# Patient Record
Sex: Female | Born: 2000 | Race: Black or African American | Hispanic: No | Marital: Single | Smoking: Never smoker
Health system: Southern US, Community
[De-identification: ages and names within clinical notes are randomized; demographics above are authoritative.]

## PROBLEM LIST (undated history)

## (undated) ENCOUNTER — Inpatient Hospital Stay (HOSPITAL_COMMUNITY): Payer: Self-pay

## (undated) DIAGNOSIS — Z789 Other specified health status: Secondary | ICD-10-CM

## (undated) DIAGNOSIS — F419 Anxiety disorder, unspecified: Secondary | ICD-10-CM

## (undated) DIAGNOSIS — B9689 Other specified bacterial agents as the cause of diseases classified elsewhere: Secondary | ICD-10-CM

## (undated) DIAGNOSIS — N390 Urinary tract infection, site not specified: Secondary | ICD-10-CM

## (undated) DIAGNOSIS — N760 Acute vaginitis: Secondary | ICD-10-CM

## (undated) DIAGNOSIS — A549 Gonococcal infection, unspecified: Secondary | ICD-10-CM

## (undated) HISTORY — PX: NO PAST SURGERIES: SHX2092

## (undated) HISTORY — PX: WISDOM TOOTH EXTRACTION: SHX21

---
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
## 2019-02-11 ENCOUNTER — Other Ambulatory Visit: Payer: Self-pay

## 2019-02-11 ENCOUNTER — Encounter (HOSPITAL_COMMUNITY): Payer: Self-pay | Admitting: *Deleted

## 2019-02-11 ENCOUNTER — Ambulatory Visit (HOSPITAL_COMMUNITY)
Admission: EM | Admit: 2019-02-11 | Discharge: 2019-02-11 | Disposition: A | Discharge status: Home / Self Care | Payer: No Typology Code available for payment source | Attending: Emergency Medicine | Admitting: Emergency Medicine

## 2019-02-11 DIAGNOSIS — U071 COVID-19: Secondary | ICD-10-CM | POA: Diagnosis not present

## 2019-02-11 DIAGNOSIS — J00 Acute nasopharyngitis [common cold]: Secondary | ICD-10-CM | POA: Diagnosis present

## 2019-02-11 MED ORDER — SALINE SPRAY 0.65 % NA SOLN: 1.0000 | NASAL | 0 refills | Status: DC | PRN

## 2019-02-11 MED ORDER — CETIRIZINE HCL 10 MG PO TABS: 10.0000 mg | ORAL_TABLET | Freq: Every day | ORAL | 0 refills | Status: DC

## 2019-02-11 MED ORDER — FLUTICASONE PROPIONATE 50 MCG/ACT NA SUSP: 1.0000 | Freq: Every day | NASAL | 2 refills | Status: DC

## 2019-02-11 NOTE — Discharge Instructions (Signed)
Push fluids to ensure adequate hydration and keep secretions thin.  Daily zyrtec and daily flonase. Use of saline nasal spray to keep nose moisturized.  Self isolate until results are back and negative.  Will notify you of any positive findings. You may monitor your results on your MyChart online as well.

## 2019-02-11 NOTE — ED Triage Notes (Signed)
C/O starting with nasal congestion, redness, inside of nose "feeling really open", loss of smell x 3 days.  Denies any other sxs.

## 2019-02-11 NOTE — ED Provider Notes (Signed)
[facility]    CSN: [id number] Arrival date & time: 02/11/19  1050      History   Chief Complaint Chief Complaint  Patient presents with  . Nasal Congestion    HPI Denise Tanner is a 18 y.o. female.   Denise Tanner presents with complaints of 1 week of symptoms. "cold." started out with body aches. Runny nose, mild. Voice felt hoarse. Started taking daytime and night time cold medication, robitussin. These helped. A few days ago. Her nose "opened up" decreased smell and decrease taste. Tried using a nasal spray from the [employer], which hasn't helped. No further body aches. No fever. No cough. No headache. No nausea vomiting or diarrhea. No ill contacts. Some right ear pain. No sore throat. Working at [employer] currently. Without contributing medical history.      ROS per HPI, negative if not otherwise mentioned.      History reviewed. No pertinent past medical history.  Patient Active Problem List   Diagnosis Date Noted  . 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 Behavior concern     Past Surgical History:  Procedure Laterality Date  . NO PAST SURGERIES      OB History    Gravida  0   Para  0   Term  0   Preterm  0   AB  0   Living  0     SAB  0   TAB  0   Ectopic  0   Multiple  0   Live Births               Home Medications    Prior to Admission medications   Medication Sig Start Date End Date Taking? Authorizing Provider  diphenhydrAMINE HCl (ALLERGY MED PO) Take by mouth.   Yes [provider]  medroxyPROGESTERone Acetate (DEPO-PROVERA IM) Inject into the muscle.   Yes [provider]  cetirizine (ZYRTEC) 10 MG tablet Take 1 tablet (10 mg total) by mouth daily. 02/11/19   Georgetta HaberBurky, Missi Mcmackin B, NP  cyclobenzaprine (FLEXERIL) 5 MG tablet Take 2 tablets (10 mg total) by mouth 2 (two) times daily as needed for muscle spasms. 11/24/18   Aviva KluverMurray, Alyssa B, PA-C   fluticasone (FLONASE) 50 MCG/ACT nasal spray Place 1 spray into both nostrils daily. 02/11/19   Georgetta HaberBurky, Anaria Kroner B, NP  naproxen (NAPROSYN) 375 MG tablet Take 1 tablet (375 mg total) by mouth 2 (two) times daily. 11/24/18   Aviva KluverMurray, Alyssa B, PA-C  sodium chloride (OCEAN) 0.65 % SOLN nasal spray Place 1 spray into both nostrils as needed for congestion. And to moisturize nose 02/11/19   Linus MakoBurky, Dolora Ridgely B, NP  medroxyPROGESTERone (DEPO-PROVERA) 150 MG/ML injection Inject 150 mg into the muscle every 3 (three) months.  11/24/18  [provider]    Family History Family History  Problem Relation Age of Onset  . Sickle cell anemia Mother     Social History Social History   Tobacco Use  . Smoking status: Never Smoker  . Smokeless tobacco: Never Used  Substance Use Topics  . Alcohol use: No  . Drug use: No     Allergies   Patient has no known allergies.   Review of Systems Review of Systems   Physical Exam Triage Vital Signs ED Triage Vitals  Enc Vitals Group     BP 02/11/19 1118 112/75     Pulse Rate 02/11/19 1118 83  Resp 02/11/19 1118 16     Temp 02/11/19 1118 97.9 F (36.6 C)     Temp Source 02/11/19 1118 Other     SpO2 02/11/19 1118 99 %     Weight --      Height --      Head Circumference --      Peak Flow --      Pain Score 02/11/19 1121 0     Pain Loc --      Pain Edu? --      Excl. in [place]? --    No data found.  Updated Vital Signs BP 112/75   Pulse 83   Temp 97.9 F (36.6 C) (Other (Comment))   Resp 16   SpO2 99%    Physical Exam Constitutional:      General: She is not in acute distress.    Appearance: She is well-developed.  HENT:     Nose: Mucosal edema present.  Cardiovascular:     Rate and Rhythm: Normal rate and regular rhythm.     Heart sounds: Normal heart sounds.  Pulmonary:     Effort: Pulmonary effort is normal.     Breath sounds: Normal breath sounds.  Skin:    General: Skin is warm and dry.  Neurological:     Mental  Status: She is alert and oriented to person, place, and time.      UC Treatments / Results  Labs (all labs ordered are listed, but only abnormal results are displayed) Labs Reviewed  NOVEL CORONAVIRUS, NAA (HOSP ORDER, SEND-OUT TO REF LAB; TAT 18-24 HRS)    EKG   Radiology No results found.  Procedures Procedures (including critical care time)  Medications Ordered in UC Medications - No data to display  Initial Impression / Assessment and Plan / UC Course  I have reviewed the triage vital signs and the nursing notes.  Pertinent labs & imaging results that were available during my care of the patient were reviewed by me and considered in my medical decision making (see chart for details).     Non toxic. Benign physical exam.  Likely allergic rhinitis. Supportive cares recommended. covid pending. Will notify of any positive findings and if any changes to treatment are needed.  If symptoms worsen or do not improve in the next week to return to be seen or to follow up with PCP.  Patient verbalized understanding and agreeable to plan.   Final Clinical Impressions(s) / UC Diagnoses   Final diagnoses:  Acute rhinitis     Discharge Instructions     Push fluids to ensure adequate hydration and keep secretions thin.  Daily zyrtec and daily flonase. Use of saline nasal spray to keep nose moisturized.  Self isolate until results are back and negative.  Will notify you of any positive findings. You may monitor your results on your MyChart online as well.       ED Prescriptions    Medication Sig Dispense Auth. Provider   cetirizine (ZYRTEC) 10 MG tablet Take 1 tablet (10 mg total) by mouth daily. 30 tablet Augusto Gamble B, NP   fluticasone (FLONASE) 50 MCG/ACT nasal spray Place 1 spray into both nostrils daily. 16 g Augusto Gamble B, NP   sodium chloride (OCEAN) 0.65 % SOLN nasal spray Place 1 spray into both nostrils as needed for congestion. And to moisturize nose 60 mL  Augusto Gamble B, NP     Controlled Substance Prescriptions [place] Controlled Substance Registry consulted? Not Applicable  Georgetta Haber, NP 02/11/19 1229

## 2019-02-12 LAB — NOVEL CORONAVIRUS, NAA (HOSP ORDER, SEND-OUT TO REF LAB; TAT 18-24 HRS): SARS-CoV-2, NAA: DETECTED — AB

## 2019-02-13 ENCOUNTER — Telehealth (HOSPITAL_COMMUNITY): Payer: Self-pay | Admitting: Emergency Medicine

## 2019-02-13 NOTE — Telephone Encounter (Signed)
Patient contacted and made aware of   Positive covid  results, all questions answered mychart set up and work notes sent.

## 2019-02-18 ENCOUNTER — Ambulatory Visit (HOSPITAL_COMMUNITY)
Admission: EM | Admit: 2019-02-18 | Discharge: 2019-02-18 | Disposition: A | Discharge status: Home / Self Care | Payer: No Typology Code available for payment source | Attending: Emergency Medicine | Admitting: Emergency Medicine

## 2019-02-18 ENCOUNTER — Encounter (HOSPITAL_COMMUNITY): Payer: Self-pay

## 2019-02-18 ENCOUNTER — Other Ambulatory Visit: Payer: Self-pay

## 2019-02-18 DIAGNOSIS — K047 Periapical abscess without sinus: Secondary | ICD-10-CM | POA: Diagnosis not present

## 2019-02-18 DIAGNOSIS — H9201 Otalgia, right ear: Secondary | ICD-10-CM | POA: Diagnosis not present

## 2019-02-18 MED ORDER — AMOXICILLIN-POT CLAVULANATE 875-125 MG PO TABS: 1.0000 | ORAL_TABLET | Freq: Two times a day (BID) | ORAL | 0 refills | Status: AC

## 2019-02-18 MED ORDER — IBUPROFEN 600 MG PO TABS: 600.0000 mg | ORAL_TABLET | Freq: Four times a day (QID) | ORAL | 0 refills | Status: DC | PRN

## 2019-02-18 NOTE — ED Provider Notes (Addendum)
HPI  SUBJECTIVE:  Denise Tanner is a 18 y.o. female who presents with 2 issues.  First, she is requesting repeat COVID testing and wants to know if she is safe to return to work/be with her friends.  She had COVID symptoms starting 12 days ago, tested positive here 6 days ago.  She was feeling well then.  She states that she has felt completely well for about a week.  She has not required any antipyretics in the past 3 days.   Second, she reports 3 days of right upper dental pain and earache.  She describes it as intermittent, sore.  No fevers, drooling, trismus, facial swelling.  No change in hearing, otorrhea.  Ear pain is not associated with chewing or yawning.  She denies dental sensitivity to air or temperature.  No aggravating or alleviating factors.  She has not tried anything for this.  No dental trauma.  No known gingival swelling.  Past medical history negative for diabetes, smoking.  LMP: She is on Depo-Medrol.  She denies the possibility of being pregnant.  PMD: Cannot remember his name.  Dentist: [facility]    History reviewed. No pertinent past medical history.  Past Surgical History:  Procedure Laterality Date  . NO PAST SURGERIES      Family History  Problem Relation Age of Onset  . Sickle cell anemia Mother     Social History   Tobacco Use  . Smoking status: Never Smoker  . Smokeless tobacco: Never Used  Substance Use Topics  . Alcohol use: No  . Drug use: No    No current facility-administered medications for this encounter.   Current Outpatient Medications:  .  amoxicillin-clavulanate (AUGMENTIN) 875-125 MG tablet, Take 1 tablet by mouth 2 (two) times daily for 7 days., Disp: 14 tablet, Rfl: 0 .  cetirizine (ZYRTEC) 10 MG tablet, Take 1 tablet (10 mg total) by mouth daily., Disp: 30 tablet, Rfl: 0 .  cyclobenzaprine (FLEXERIL) 5 MG tablet, Take 2 tablets (10 mg total) by mouth 2 (two) times daily as needed for muscle spasms., Disp: 10 tablet, Rfl: 0 .   fluticasone (FLONASE) 50 MCG/ACT nasal spray, Place 1 spray into both nostrils daily., Disp: 16 g, Rfl: 2 .  ibuprofen (ADVIL) 600 MG tablet, Take 1 tablet (600 mg total) by mouth every 6 (six) hours as needed., Disp: 30 tablet, Rfl: 0 .  medroxyPROGESTERone Acetate (DEPO-PROVERA IM), Inject into the muscle., Disp: , Rfl:  .  sodium chloride (OCEAN) 0.65 % SOLN nasal spray, Place 1 spray into both nostrils as needed for congestion. And to moisturize nose, Disp: 60 mL, Rfl: 0  No Known Allergies   ROS  As noted in HPI.   Physical Exam  BP 117/65 (BP Location: Right Arm)   Pulse 99   Temp 98.6 F (37 C) (Oral)   Resp 18   SpO2 100%   Constitutional: Well developed, well nourished, no acute distress Eyes:  EOMI, conjunctiva normal bilaterally HENT: Normocephalic, atraumatic,mucus membranes moist.  External ear within normal limits.  EAC normal.  TM normal.  No tenderness over the mastoid, no pain with traction on pinna.  Mild tenderness over the tragus.  No tenderness over the TMJ.   Tooth number 2-second right upper molar tender to palpation with surrounding gingival swelling.  No expressible purulent drainage.  Tooth appears intact.  No trismus.  No facial swelling. Neck: No cervical lymphadenopathy. Respiratory: Normal inspiratory effort Cardiovascular: Normal rate GI: nondistended skin: No rash,  skin intact Musculoskeletal: no deformities Neurologic: Alert & oriented x 3, no focal neuro deficits Psychiatric: Speech and behavior appropriate   ED Course   Medications - No data to display  No orders of the defined types were placed in this encounter.   No results found for this or any previous visit (from the past 24 hour(s)). No results found.  ED Clinical Impression  1. Dental infection   2. Right ear pain      ED Assessment/Plan  Patient is cleared to return to work/be around her friends.  She meets CDC guidelines.  Patient with a dental infection.  Suspect  that the otalgia from this.  No evidence of otitis media.  No evidence of TMJ involvement.  Home with Augmentin, ibuprofen/Tylenol.  Follow-up with dentistry within a week.  Discussed medical decision making, treatment plan, plan for follow-up with patient.  She agrees with plan.  Meds ordered this encounter  Medications  . ibuprofen (ADVIL) 600 MG tablet    Sig: Take 1 tablet (600 mg total) by mouth every 6 (six) hours as needed.    Dispense:  30 tablet    Refill:  0  . amoxicillin-clavulanate (AUGMENTIN) 875-125 MG tablet    Sig: Take 1 tablet by mouth 2 (two) times daily for 7 days.    Dispense:  14 tablet    Refill:  0    *This clinic note was created using Scientist, clinical (histocompatibility and immunogenetics)Dragon dictation software. Therefore, there may be occasional mistakes despite careful proofreading.   ?   Domenick GongMortenson, Lenis Nettleton, MD 02/18/19 1619    Domenick GongMortenson, Wyndham Santilli, MD 02/18/19 Lisabeth Register1619    Domenick GongMortenson, Kairen Hallinan, MD 02/18/19 [id number]

## 2019-02-18 NOTE — Discharge Instructions (Addendum)
600 mg of ibuprofen combined with 1 g of Tylenol 3-4 times a day as needed for pain.  Warm or cool compresses on your face, whichever feels better.  As the dental infection gets better I suspect that your ear pain will also resolve.  You do not have an ear infection.

## 2019-02-18 NOTE — ED Triage Notes (Signed)
Pt presents with right side wisdom tooth pain and right side ear pain X 2 days.    Pt tested positive for Covid almost 2 weeks ago, pt was sick a week prior to getting tested, did recommended quarantine and is symptom free and feeling much better.

## 2019-04-04 ENCOUNTER — Encounter (HOSPITAL_COMMUNITY): Payer: Self-pay

## 2019-04-04 ENCOUNTER — Ambulatory Visit (HOSPITAL_COMMUNITY)
Admission: EM | Admit: 2019-04-04 | Discharge: 2019-04-04 | Disposition: A | Discharge status: Home / Self Care | Payer: No Typology Code available for payment source | Attending: Family Medicine | Admitting: Family Medicine

## 2019-04-04 ENCOUNTER — Other Ambulatory Visit: Payer: Self-pay

## 2019-04-04 DIAGNOSIS — R21 Rash and other nonspecific skin eruption: Secondary | ICD-10-CM | POA: Insufficient documentation

## 2019-04-04 MED ORDER — PREDNISONE 10 MG PO TABS: 40.0000 mg | ORAL_TABLET | Freq: Every day | ORAL | 0 refills | Status: AC

## 2019-04-04 NOTE — ED Triage Notes (Signed)
Pt states she has blister on her lips x 4 days. Pt states she has been using carmax on her lip.pt states her lips are getting worst.

## 2019-04-04 NOTE — ED Provider Notes (Signed)
[facility]    CSN: [id number] Arrival date & time: 04/04/19  3825      History   Chief Complaint Chief Complaint  Patient presents with  . Allergic Reaction    HPI Denise Tanner is a 18 y.o. female.   Patient is a 18 year old female that presents today with rash to oral area.  This is been constant for the past 4 days.  She has been using Carmex on the lips without any relief.  Describes the rash is mildly itchy and painful.  Feels like her symptoms are getting worse.  History of contact dermatitis in the past.  Denies being currently sexually active or any concerns of STI exposure.  No history of HSV.   Denies any fever, joint pain. Denies any recent changes in lotions, detergents, foods or other possible irritants. No recent travel. Nobody else at home has the rash. Patient has been outside but denies any contact with plants or insects. No new foods or medications.   ROS per HPI      Allergic Reaction   History reviewed. No pertinent past medical history.  Patient Active Problem List   Diagnosis Date Noted  . 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 Behavior concern     Past Surgical History:  Procedure Laterality Date  . NO PAST SURGERIES      OB History    Gravida  0   Para  0   Term  0   Preterm  0   AB  0   Living  0     SAB  0   TAB  0   Ectopic  0   Multiple  0   Live Births               Home Medications    Prior to Admission medications   Medication Sig Start Date End Date Taking? Authorizing Provider  medroxyPROGESTERone Acetate (DEPO-PROVERA IM) Inject into the muscle.    [provider]  predniSONE (DELTASONE) 10 MG tablet Take 4 tablets (40 mg total) by mouth daily for 3 days. 04/04/19 04/07/19  Dahlia Byes A, NP  cetirizine (ZYRTEC) 10 MG tablet Take 1 tablet (10 mg total) by mouth daily. 02/11/19 04/04/19  Georgetta Haber, NP  fluticasone (FLONASE) 50  MCG/ACT nasal spray Place 1 spray into both nostrils daily. 02/11/19 04/04/19  Georgetta Haber, NP  medroxyPROGESTERone (DEPO-PROVERA) 150 MG/ML injection Inject 150 mg into the muscle every 3 (three) months.  11/24/18  [provider]  sodium chloride (OCEAN) 0.65 % SOLN nasal spray Place 1 spray into both nostrils as needed for congestion. And to moisturize nose 02/11/19 04/04/19  Georgetta Haber, NP    Family History Family History  Problem Relation Age of Onset  . Sickle cell anemia Mother     Social History Social History   Tobacco Use  . Smoking status: Never Smoker  . Smokeless tobacco: Never Used  Substance Use Topics  . Alcohol use: No  . Drug use: No     Allergies   Patient has no known allergies.   Review of Systems Review of Systems   Physical Exam Triage Vital Signs ED Triage Vitals  Enc Vitals Group     BP 04/04/19 0913 119/74     Pulse Rate 04/04/19 0913 83     Resp 04/04/19 0913 16     Temp 04/04/19 0913 98.4 F (36.9 C)  Temp src --      SpO2 04/04/19 0913 99 %     Weight 04/04/19 0915 150 lb (68 kg)     Height --      Head Circumference --      Peak Flow --      Pain Score 04/04/19 0914 8     Pain Loc --      Pain Edu? --      Excl. in [place]? --    No data found.  Updated Vital Signs BP 119/74 (BP Location: Right Arm)   Pulse 83   Temp 98.4 F (36.9 C)   Resp 16   Wt 150 lb (68 kg)   SpO2 99%   Visual Acuity Right Eye Distance:   Left Eye Distance:   Bilateral Distance:    Right Eye Near:   Left Eye Near:    Bilateral Near:     Physical Exam Vitals signs reviewed.  Constitutional:      General: She is not in acute distress.    Appearance: Normal appearance. She is not ill-appearing, toxic-appearing or diaphoretic.  HENT:     Head: Normocephalic and atraumatic.     Nose: Nose normal.     Mouth/Throat:     Pharynx: Oropharynx is clear.  Eyes:     Conjunctiva/sclera: Conjunctivae normal.  Pulmonary:     Effort:  Pulmonary effort is normal.  Musculoskeletal: Normal range of motion.  Skin:    General: Skin is warm and dry.     Findings: Rash present.     Comments: Papular rash to perioral area with some pustules.  Neurological:     Mental Status: She is alert.  Psychiatric:        Mood and Affect: Mood normal.      UC Treatments / Results  Labs (all labs ordered are listed, but only abnormal results are displayed) Labs Reviewed  HSV CULTURE AND TYPING    EKG   Radiology No results found.  Procedures Procedures (including critical care time)  Medications Ordered in UC Medications - No data to display  Initial Impression / Assessment and Plan / UC Course  I have reviewed the triage vital signs and the nursing notes.  Pertinent labs & imaging results that were available during my care of the patient were reviewed by me and considered in my medical decision making (see chart for details).     Rash-treating for possible contact dermatitis Prednisone daily for 3 days. Recommended not putting anything on the lips or face at this time. Swab for HSV to rule this out but less likely Follow up as needed for continued or worsening symptoms  Final Clinical Impressions(s) / UC Diagnoses   Final diagnoses:  Rash     Discharge Instructions     Take the prednisone daily for 3 days.  Do not put anything else on the lips.  Take this medication with food. We will let you know what your swab results are. Follow up as needed for continued or worsening symptoms     ED Prescriptions    Medication Sig Dispense Auth. Provider   predniSONE (DELTASONE) 10 MG tablet Take 4 tablets (40 mg total) by mouth daily for 3 days. 12 tablet Novie Maggio A, NP     PDMP not reviewed this encounter.   Orvan July, NP 04/04/19 1109

## 2019-04-04 NOTE — Discharge Instructions (Signed)
Take the prednisone daily for 3 days.  Do not put anything else on the lips.  Take this medication with food. We will let you know what your swab results are. Follow up as needed for continued or worsening symptoms

## 2019-04-06 LAB — HSV CULTURE AND TYPING

## 2019-04-18 ENCOUNTER — Other Ambulatory Visit: Payer: Self-pay

## 2019-04-18 DIAGNOSIS — Z20822 Contact with and (suspected) exposure to covid-19: Secondary | ICD-10-CM

## 2019-04-19 LAB — NOVEL CORONAVIRUS, NAA: SARS-CoV-2, NAA: NOT DETECTED

## 2019-04-20 ENCOUNTER — Ambulatory Visit (HOSPITAL_COMMUNITY)
Admission: EM | Admit: 2019-04-20 | Discharge: 2019-04-20 | Disposition: A | Discharge status: Home / Self Care | Payer: No Typology Code available for payment source | Attending: Urgent Care | Admitting: Urgent Care

## 2019-04-20 ENCOUNTER — Encounter (HOSPITAL_COMMUNITY): Payer: Self-pay | Admitting: Emergency Medicine

## 2019-04-20 ENCOUNTER — Other Ambulatory Visit: Payer: Self-pay

## 2019-04-20 DIAGNOSIS — Z3202 Encounter for pregnancy test, result negative: Secondary | ICD-10-CM | POA: Diagnosis not present

## 2019-04-20 DIAGNOSIS — R82998 Other abnormal findings in urine: Secondary | ICD-10-CM | POA: Diagnosis present

## 2019-04-20 DIAGNOSIS — N760 Acute vaginitis: Secondary | ICD-10-CM

## 2019-04-20 DIAGNOSIS — B9689 Other specified bacterial agents as the cause of diseases classified elsewhere: Secondary | ICD-10-CM

## 2019-04-20 LAB — POCT URINALYSIS DIP (DEVICE)
Bilirubin Urine: NEGATIVE
Glucose, UA: NEGATIVE mg/dL
Ketones, ur: NEGATIVE mg/dL
Nitrite: NEGATIVE
Protein, ur: NEGATIVE mg/dL
Specific Gravity, Urine: 1.01 (ref 1.005–1.030)
Urobilinogen, UA: 0.2 mg/dL (ref 0.0–1.0)
pH: 6.5 (ref 5.0–8.0)

## 2019-04-20 LAB — POCT PREGNANCY, URINE: Preg Test, Ur: NEGATIVE

## 2019-04-20 NOTE — ED Provider Notes (Signed)
[place]   MRN: [id number] DOB: 22-May-2001  Subjective:   Denise Tanner is a 18 y.o. female presenting for 1 day hx of dark urine. Patient went to HD, is undergoing tx for BV but does not recall medication. Hydrates with ~2 bottles of water a day. Does not drink other beverages. No alcohol use.      No Known Allergies  History reviewed. No pertinent past medical history.   Past Surgical History:  Procedure Laterality Date  . NO PAST SURGERIES      Family History  Problem Relation Age of Onset  . Sickle cell anemia Mother     Social History   Tobacco Use  . Smoking status: Never Smoker  . Smokeless tobacco: Never Used  Substance Use Topics  . Alcohol use: No  . Drug use: No    Review of Systems  Constitutional: Negative for chills, fever and malaise/fatigue.  Respiratory: Negative for cough and shortness of breath.   Cardiovascular: Negative for chest pain.  Gastrointestinal: Negative for abdominal pain, constipation, diarrhea, nausea and vomiting.  Genitourinary: Negative for dysuria, flank pain, frequency, hematuria and urgency.  Musculoskeletal: Negative for back pain and myalgias.  Skin: Negative for rash.  Neurological: Negative for dizziness and headaches.  Psychiatric/Behavioral: Negative for substance abuse.     Objective:   Vitals: BP 106/69 (BP Location: Left Arm)   Pulse 68   Temp 98.7 F (37.1 C) (Oral)   Resp 12   LMP 04/09/2019 (Exact Date)   SpO2 100%   Physical Exam Constitutional:      General: She is not in acute distress.    Appearance: Normal appearance. She is well-developed and normal weight. She is not ill-appearing, toxic-appearing or diaphoretic.  HENT:     Head: Normocephalic and atraumatic.     Right Ear: External ear normal.     Left Ear: External ear normal.     Nose: Nose normal.     Mouth/Throat:     Mouth: Mucous membranes are moist.     Pharynx: Oropharynx is clear.  Eyes:     General: No scleral  icterus.    Extraocular Movements: Extraocular movements intact.     Pupils: Pupils are equal, round, and reactive to light.  Cardiovascular:     Rate and Rhythm: Normal rate and regular rhythm.     Heart sounds: Normal heart sounds. No murmur. No friction rub. No gallop.   Pulmonary:     Effort: Pulmonary effort is normal. No respiratory distress.     Breath sounds: Normal breath sounds. No stridor. No wheezing, rhonchi or rales.  Abdominal:     General: Bowel sounds are normal. There is no distension.     Palpations: Abdomen is soft. There is no mass.     Tenderness: There is no abdominal tenderness. There is no right CVA tenderness, left CVA tenderness, guarding or rebound.  Skin:    General: Skin is warm and dry.     Coloration: Skin is not pale.     Findings: No rash.  Neurological:     General: No focal deficit present.     Mental Status: She is alert and oriented to person, place, and time.  Psychiatric:        Mood and Affect: Mood normal.        Behavior: Behavior normal.        Thought Content: Thought content normal.        Judgment: Judgment normal.  Results for orders placed or performed during the hospital encounter of 04/20/19 (from the past 24 hour(s))  POCT urinalysis dip (device)     Status: Abnormal   Collection Time: 04/20/19 10:12 AM  Result Value Ref Range   Glucose, UA NEGATIVE NEGATIVE mg/dL   Bilirubin Urine NEGATIVE NEGATIVE   Ketones, ur NEGATIVE NEGATIVE mg/dL   Specific Gravity, Urine 1.010 1.005 - 1.030   Hgb urine dipstick TRACE (A) NEGATIVE   pH 6.5 5.0 - 8.0   Protein, ur NEGATIVE NEGATIVE mg/dL   Urobilinogen, UA 0.2 0.0 - 1.0 mg/dL   Nitrite NEGATIVE NEGATIVE   Leukocytes,Ua TRACE (A) NEGATIVE  Pregnancy, urine POC     Status: None   Collection Time: 04/20/19 10:16 AM  Result Value Ref Range   Preg Test, Ur NEGATIVE NEGATIVE    Assessment and Plan :   1. Dark urine   2. Bacterial vaginosis     Urine culture pending patient  has already had STI testing.  Recommended that she finish course of antibiotic for treating her BV.  Advised daily adequate hydration with at least 64 ounces of water. Counseled patient on potential for adverse effects with medications prescribed/recommended today, ER and return-to-clinic precautions discussed, patient verbalized understanding.    Wallis Bamberg, PA-C 04/20/19 1144

## 2019-04-20 NOTE — ED Triage Notes (Signed)
Pt here for concerns for dark colored urine.  [facility] and was [facility] medication.  She states her BV symptoms are getting better but she is concerned with the color of her urine.

## 2019-04-20 NOTE — Discharge Instructions (Signed)
Make sure you hydrate well with at least 64 ounces of water daily. Finish your antibiotic for BV. If you develop fever, n/v, painful urination, frank blood in the urine, belly pain, then come back for a recheck.

## 2019-04-23 LAB — URINE CULTURE: Culture: 50000 — AB

## 2019-04-24 ENCOUNTER — Other Ambulatory Visit: Payer: Self-pay

## 2019-04-24 ENCOUNTER — Telehealth (HOSPITAL_COMMUNITY): Payer: Self-pay | Admitting: Emergency Medicine

## 2019-04-24 ENCOUNTER — Ambulatory Visit (HOSPITAL_COMMUNITY)
Admission: EM | Admit: 2019-04-24 | Discharge: 2019-04-24 | Disposition: A | Discharge status: Home / Self Care | Payer: No Typology Code available for payment source | Attending: Internal Medicine | Admitting: Internal Medicine

## 2019-04-24 DIAGNOSIS — N760 Acute vaginitis: Secondary | ICD-10-CM | POA: Diagnosis present

## 2019-04-24 LAB — POCT URINALYSIS DIP (DEVICE)
Glucose, UA: NEGATIVE mg/dL
Nitrite: POSITIVE — AB
Protein, ur: 30 mg/dL — AB
Specific Gravity, Urine: >=1.03 (ref 1.005–1.030)
Urobilinogen, UA: 0.2 mg/dL (ref 0.0–1.0)
pH: 5.5 (ref 5.0–8.0)

## 2019-04-24 MED ORDER — METRONIDAZOLE 500 MG PO TABS: 500.0000 mg | ORAL_TABLET | Freq: Two times a day (BID) | ORAL | 0 refills | Status: DC

## 2019-04-24 NOTE — Telephone Encounter (Signed)
Urine culture only had 50,000 colonies. Called patient and discussed with her, she states she has no urinary symptoms but is c/o of a lot of vaginal discharge and irritation. Pt states she would like to return to be seen. Pt instructed that we will not do any intervention regarding urine culture until she sees a provider today for follow up. Pt agreeable to plan, will discuss urine culture with provider when she returns to clinic.

## 2019-04-24 NOTE — ED Triage Notes (Signed)
Patient presents to Urgent Care with complaints of continued vaginal discharge and swelling on her right labia since a few days ago. Patient reports she was treated for BV [facility] the [facility] and did not take all the metronidazole (knows she should have completed course) and then had sexual intercourse and her partner "sucked on it too hard and now the right lip is swollen" pt reports the discharge is still happening.

## 2019-04-26 ENCOUNTER — Telehealth (HOSPITAL_COMMUNITY): Payer: Self-pay | Admitting: Emergency Medicine

## 2019-04-26 LAB — CERVICOVAGINAL ANCILLARY ONLY
Bacterial vaginitis: NEGATIVE
Candida vaginitis: POSITIVE — AB
Chlamydia: NEGATIVE
Neisseria Gonorrhea: NEGATIVE
Trichomonas: NEGATIVE

## 2019-04-26 MED ORDER — FLUCONAZOLE 150 MG PO TABS: 150.0000 mg | ORAL_TABLET | Freq: Once | ORAL | 0 refills | Status: AC

## 2019-04-26 MED ORDER — CEPHALEXIN 500 MG PO CAPS: 500.0000 mg | ORAL_CAPSULE | Freq: Two times a day (BID) | ORAL | 0 refills | Status: AC

## 2019-04-26 NOTE — ED Provider Notes (Signed)
[facility]    CSN: [id number] Arrival date & time: 04/24/19  1741      History   Chief Complaint Chief Complaint  Patient presents with  . Vaginal Discharge    HPI Denise Tanner is a 18 y.o. female with no past medical history comes to urgent care with vaginal discharge and swelling of the right labia.  Patient was diagnosed with bacterial vaginosis and was prescribed metronidazole but the patient did not complete the treatment.  She continues to have foul-smelling vaginal discharge.  Discharge is discolored.  Patient also complains of pain and swelling of the right labia.   She recently had sexual intercourse with her boyfriend.  Patient attributes the swelling to the recent intercourse.  Patient denies any dysuria, urgency or frequency.  HPI  No past medical history on file.  Patient Active Problem List   Diagnosis Date Noted  . 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 Behavior concern     Past Surgical History:  Procedure Laterality Date  . NO PAST SURGERIES      OB History    Gravida  0   Para  0   Term  0   Preterm  0   AB  0   Living  0     SAB  0   TAB  0   Ectopic  0   Multiple  0   Live Births               Home Medications    Prior to Admission medications   Medication Sig Start Date End Date Taking? Authorizing Provider  metroNIDAZOLE (FLAGYL) 500 MG tablet Take 1 tablet (500 mg total) by mouth 2 (two) times daily. 04/24/19   Merrilee Jansky, MD  cetirizine (ZYRTEC) 10 MG tablet Take 1 tablet (10 mg total) by mouth daily. 02/11/19 04/04/19  Georgetta Haber, NP  fluticasone (FLONASE) 50 MCG/ACT nasal spray Place 1 spray into both nostrils daily. 02/11/19 04/04/19  Georgetta Haber, NP  medroxyPROGESTERone (DEPO-PROVERA) 150 MG/ML injection Inject 150 mg into the muscle every 3 (three) months.  11/24/18  [provider]  sodium chloride (OCEAN) 0.65 % SOLN nasal spray Place  1 spray into both nostrils as needed for congestion. And to moisturize nose 02/11/19 04/04/19  Georgetta Haber, NP    Family History Family History  Problem Relation Age of Onset  . Sickle cell anemia Mother     Social History Social History   Tobacco Use  . Smoking status: Never Smoker  . Smokeless tobacco: Never Used  Substance Use Topics  . Alcohol use: No  . Drug use: No     Allergies   Patient has no known allergies.   Review of Systems Review of Systems  Constitutional: Negative.   Gastrointestinal: Negative for abdominal pain, nausea and vomiting.  Genitourinary: Positive for genital sores, vaginal discharge and vaginal pain. Negative for dysuria, frequency, pelvic pain, urgency and vaginal bleeding.  Musculoskeletal: Negative.   Neurological: Negative.      Physical Exam Triage Vital Signs ED Triage Vitals  Enc Vitals Group     BP 04/24/19 1921 120/78     Pulse Rate 04/24/19 1921 92     Resp 04/24/19 1921 14     Temp 04/24/19 1921 98.3 F (36.8 C)     Temp Source 04/24/19 1921 Oral     SpO2 04/24/19 1921 100 %     Weight --  Height --      Head Circumference --      Peak Flow --      Pain Score 04/24/19 1920 0     Pain Loc --      Pain Edu? --      Excl. in [place]? --    No data found.  Updated Vital Signs BP 120/78 (BP Location: Right Arm)   Pulse 92   Temp 98.3 F (36.8 C) (Oral)   Resp 14   LMP 04/09/2019 (Exact Date)   SpO2 100%   Visual Acuity Right Eye Distance:   Left Eye Distance:   Bilateral Distance:    Right Eye Near:   Left Eye Near:    Bilateral Near:     Physical Exam Vitals signs and nursing note reviewed.  Constitutional:      General: She is not in acute distress.    Appearance: Normal appearance. She is not ill-appearing.  Neck:     Musculoskeletal: Normal range of motion and neck supple.  Cardiovascular:     Rate and Rhythm: Normal rate and regular rhythm.  Pulmonary:     Effort: Pulmonary effort is normal.      Breath sounds: Normal breath sounds.  Abdominal:     General: Bowel sounds are normal. There is no distension.     Palpations: Abdomen is soft.     Tenderness: There is no abdominal tenderness. There is no guarding or rebound.  Genitourinary:    General: Normal vulva.     Vagina: Vaginal discharge present.     Rectum: Normal.  Musculoskeletal: Normal range of motion.  Skin:    General: Skin is warm.  Neurological:     General: No focal deficit present.     Mental Status: She is alert.      UC Treatments / Results  Labs (all labs ordered are listed, but only abnormal results are displayed) Labs Reviewed  POCT URINALYSIS DIP (DEVICE) - Abnormal; Notable for the following components:      Result Value   Bilirubin Urine SMALL (*)    Ketones, ur TRACE (*)    Hgb urine dipstick TRACE (*)    Protein, ur 30 (*)    Nitrite POSITIVE (*)    Leukocytes,Ua SMALL (*)    All other components within normal limits  CERVICOVAGINAL ANCILLARY ONLY - Abnormal; Notable for the following components:   Candida vaginitis **POSITIVE for Candida species** (*)    All other components within normal limits    EKG   Radiology No results found.  Procedures Procedures (including critical care time)  Medications Ordered in UC Medications - No data to display  Initial Impression / Assessment and Plan / UC Course  I have reviewed the triage vital signs and the nursing notes.  Pertinent labs & imaging results that were available during my care of the patient were reviewed by me and considered in my medical decision making (see chart for details).     1.  Vaginal discharge likely bacterial vaginosis: Metronidazole 500 mg twice daily for 7 days Cervicovaginal for STD testing as well as bacterial vaginosis and yeast infection If the test results are significant, patient will be informed and the treatment regimen will be updated If patient's symptoms worsens she is advised to return to urgent  care to be reevaluated Adherence to medication was emphasized. Final Clinical Impressions(s) / UC Diagnoses   Final diagnoses:  Vaginitis and vulvovaginitis   Discharge Instructions   None  ED Prescriptions    Medication Sig Dispense Auth. Provider   metroNIDAZOLE (FLAGYL) 500 MG tablet Take 1 tablet (500 mg total) by mouth 2 (two) times daily. 14 tablet Bayyinah Dukeman, Britta MccreedyPhilip O, MD     PDMP not reviewed this encounter.   Merrilee JanskyLamptey, Gwynneth Fabio O, MD 04/26/19 1128

## 2019-04-26 NOTE — Telephone Encounter (Signed)
Per Dr. Lanny Cramp, will treat pt for uti with keflex 500mg  bid x5 days. Yeast also not treated. Patient contacted and made aware of    results. Pt verbalized understanding and had all questions answered.

## 2019-06-09 ENCOUNTER — Ambulatory Visit (HOSPITAL_COMMUNITY)
Admission: EM | Admit: 2019-06-09 | Discharge: 2019-06-09 | Disposition: A | Discharge status: Home / Self Care | Payer: No Typology Code available for payment source | Attending: Urgent Care | Admitting: Urgent Care

## 2019-06-09 ENCOUNTER — Other Ambulatory Visit: Payer: Self-pay

## 2019-06-09 ENCOUNTER — Encounter (HOSPITAL_COMMUNITY): Payer: Self-pay

## 2019-06-09 DIAGNOSIS — N939 Abnormal uterine and vaginal bleeding, unspecified: Secondary | ICD-10-CM | POA: Diagnosis present

## 2019-06-09 DIAGNOSIS — Z3202 Encounter for pregnancy test, result negative: Secondary | ICD-10-CM | POA: Diagnosis not present

## 2019-06-09 DIAGNOSIS — N3000 Acute cystitis without hematuria: Secondary | ICD-10-CM | POA: Insufficient documentation

## 2019-06-09 LAB — POC URINE PREG, ED: Preg Test, Ur: NEGATIVE

## 2019-06-09 LAB — POCT URINALYSIS DIP (DEVICE)
Glucose, UA: NEGATIVE mg/dL
Ketones, ur: 15 mg/dL — AB
Nitrite: NEGATIVE
Protein, ur: 100 mg/dL — AB
Specific Gravity, Urine: >=1.03 (ref 1.005–1.030)
Urobilinogen, UA: 1 mg/dL (ref 0.0–1.0)
pH: 6.5 (ref 5.0–8.0)

## 2019-06-09 LAB — POCT PREGNANCY, URINE: Preg Test, Ur: NEGATIVE

## 2019-06-09 MED ORDER — NITROFURANTOIN MONOHYD MACRO 100 MG PO CAPS: 100.0000 mg | ORAL_CAPSULE | Freq: Two times a day (BID) | ORAL | 0 refills | Status: AC

## 2019-06-09 NOTE — ED Triage Notes (Signed)
Pt present a tingling sensation when she urinate. She also states that it has been difficult for her to urinate as while.

## 2019-06-09 NOTE — ED Provider Notes (Signed)
[facility]    CSN: [id number] Arrival date & time: 06/09/19  1630      History   Chief Complaint Chief Complaint  Patient presents with  . Exposure to STD    HPI Denise Tanner is a 19 y.o. female.   Denise Tanner presents with complaints of pain, frequency and difficulty with urination which started today. No back pain or pelvic pain. No fevers. Has had some vaginal spotting as well. States this month her period was irregular, lasting longer than normal for her. Her first period after stopping Depo was in November. She is sexually active with 1 partner, doesn't use condoms. Denies any other vaginal symptoms, no known std exposure. She has not been on depo in 6 months. No noted blood to urine. Denies any previous similar.    ROS per HPI, negative if not otherwise mentioned.      History reviewed. No pertinent past medical history.  Patient Active Problem List   Diagnosis Date Noted  . 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 Behavior concern     Past Surgical History:  Procedure Laterality Date  . NO PAST SURGERIES      OB History    Gravida  0   Para  0   Term  0   Preterm  0   AB  0   Living  0     SAB  0   TAB  0   Ectopic  0   Multiple  0   Live Births               Home Medications    Prior to Admission medications   Medication Sig Start Date End Date Taking? Authorizing Provider  metroNIDAZOLE (FLAGYL) 500 MG tablet Take 1 tablet (500 mg total) by mouth 2 (two) times daily. 04/24/19   LampteyBritta Mccreedy, MD  nitrofurantoin, macrocrystal-monohydrate, (MACROBID) 100 MG capsule Take 1 capsule (100 mg total) by mouth 2 (two) times daily for 5 days. 06/09/19 06/14/19  Georgetta Haber, NP  cetirizine (ZYRTEC) 10 MG tablet Take 1 tablet (10 mg total) by mouth daily. 02/11/19 04/04/19  Georgetta Haber, NP  fluticasone (FLONASE) 50 MCG/ACT nasal spray Place 1 spray into both nostrils daily.  02/11/19 04/04/19  Georgetta Haber, NP  medroxyPROGESTERone (DEPO-PROVERA) 150 MG/ML injection Inject 150 mg into the muscle every 3 (three) months.  11/24/18  [provider]  sodium chloride (OCEAN) 0.65 % SOLN nasal spray Place 1 spray into both nostrils as needed for congestion. And to moisturize nose 02/11/19 04/04/19  Georgetta Haber, NP    Family History Family History  Problem Relation Age of Onset  . Sickle cell anemia Mother     Social History Social History   Tobacco Use  . Smoking status: Never Smoker  . Smokeless tobacco: Never Used  Substance Use Topics  . Alcohol use: No  . Drug use: No     Allergies   Patient has no known allergies.   Review of Systems Review of Systems   Physical Exam Triage Vital Signs ED Triage Vitals [06/09/19 1705]  Enc Vitals Group     BP      Pulse      Resp      Temp      Temp src      SpO2      Weight      Height      Head  Circumference      Peak Flow      Pain Score 0     Pain Loc      Pain Edu?      Excl. in [place]?    No data found.  Updated Vital Signs There were no vitals taken for this visit.  Visual Acuity Right Eye Distance:   Left Eye Distance:   Bilateral Distance:    Right Eye Near:   Left Eye Near:    Bilateral Near:     Physical Exam Constitutional:      General: She is not in acute distress.    Appearance: She is well-developed.  Cardiovascular:     Rate and Rhythm: Normal rate.  Pulmonary:     Effort: Pulmonary effort is normal.  Abdominal:     Palpations: Abdomen is soft. Abdomen is not rigid.     Tenderness: There is no abdominal tenderness. There is no right CVA tenderness, left CVA tenderness, guarding or rebound.  Genitourinary:    Comments: Denies sores, lesions, vaginal bleeding; no pelvic pain; gu exam deferred at this time, vaginal self swab collected.   Skin:    General: Skin is warm and dry.  Neurological:     Mental Status: She is alert and oriented to person, place,  and time.      UC Treatments / Results  Labs (all labs ordered are listed, but only abnormal results are displayed) Labs Reviewed  POCT URINALYSIS DIP (DEVICE) - Abnormal; Notable for the following components:      Result Value   Bilirubin Urine SMALL (*)    Ketones, ur 15 (*)    Hgb urine dipstick LARGE (*)    Protein, ur 100 (*)    Leukocytes,Ua TRACE (*)    All other components within normal limits  URINE CULTURE  POC URINE PREG, ED  POCT PREGNANCY, URINE  CERVICOVAGINAL ANCILLARY ONLY    EKG   Radiology No results found.  Procedures Procedures (including critical care time)  Medications Ordered in UC Medications - No data to display  Initial Impression / Assessment and Plan / UC Course  I have reviewed the triage vital signs and the nursing notes.  Pertinent labs & imaging results that were available during my care of the patient were reviewed by me and considered in my medical decision making (see chart for details).     Trace leuks to urine with symptoms of uti, culture obtained. Blood to urine but also with some vaginal spotting. macrobid initiated pending urine culture. Vaginal cytology also pending. Encouraged follow up with PCP and/or gyne if periods remain irregular. Return precautions provided. Patient verbalized understanding and agreeable to plan.   Final Clinical Impressions(s) / UC Diagnoses   Final diagnoses:  Acute cystitis without hematuria  Vaginal spotting     Discharge Instructions     We will start treatment for UTI. I have sent your urine to be cultured to confirm this as well.  Will notify you of any positive findings from your vaginal swab and if any changes to treatment are needed.   If any persistent symptoms or continued irregular bleeding please follow up with your gynecologist or PCP.    ED Prescriptions    Medication Sig Dispense Auth. Provider   nitrofurantoin, macrocrystal-monohydrate, (MACROBID) 100 MG capsule Take 1  capsule (100 mg total) by mouth 2 (two) times daily for 5 days. 10 capsule Georgetta Haber, NP     PDMP not reviewed this encounter.  Zigmund Gottron, NP 06/09/19 [phone]

## 2019-06-09 NOTE — Discharge Instructions (Addendum)
We will start treatment for UTI. I have sent your urine to be cultured to confirm this as well.  Will notify you of any positive findings from your vaginal swab and if any changes to treatment are needed.   If any persistent symptoms or continued irregular bleeding please follow up with your gynecologist or PCP.

## 2019-06-13 LAB — CERVICOVAGINAL ANCILLARY ONLY
Bacterial vaginitis: NEGATIVE
Candida vaginitis: NEGATIVE
Chlamydia: NEGATIVE
Neisseria Gonorrhea: NEGATIVE
Trichomonas: NEGATIVE

## 2019-07-16 ENCOUNTER — Ambulatory Visit (HOSPITAL_COMMUNITY)
Admission: EM | Admit: 2019-07-16 | Discharge: 2019-07-16 | Disposition: A | Discharge status: Home / Self Care | Payer: No Typology Code available for payment source | Attending: Family Medicine | Admitting: Family Medicine

## 2019-07-16 ENCOUNTER — Other Ambulatory Visit: Payer: Self-pay

## 2019-07-16 DIAGNOSIS — J029 Acute pharyngitis, unspecified: Secondary | ICD-10-CM | POA: Diagnosis present

## 2019-07-16 DIAGNOSIS — R0981 Nasal congestion: Secondary | ICD-10-CM | POA: Insufficient documentation

## 2019-07-16 DIAGNOSIS — H6501 Acute serous otitis media, right ear: Secondary | ICD-10-CM | POA: Diagnosis present

## 2019-07-16 MED ORDER — IPRATROPIUM BROMIDE 0.03 % NA SOLN: 2.0000 | Freq: Two times a day (BID) | NASAL | 0 refills | Status: DC

## 2019-07-16 MED ORDER — AMOXICILLIN 875 MG PO TABS: 875.0000 mg | ORAL_TABLET | Freq: Two times a day (BID) | ORAL | 0 refills | Status: DC

## 2019-07-16 NOTE — ED Triage Notes (Signed)
Pt present right ear pain, symptoms started two days ago. Pt denies any other symptoms from the ear pain .

## 2019-07-17 NOTE — ED Provider Notes (Addendum)
[facility]    CSN: [id number] Arrival date & time: 07/16/19  1628      History   Chief Complaint Chief Complaint  Patient presents with  . Otalgia    HPI Denise Tanner is a 19 y.o. female.   HPI Otalgia  Ear Pain: Patient presents with right ear pain.  Symptoms include gradually worsening inner pain that is occasionally sharp on-going 1 week. She denies fever. She is have right sided throat pain and nasal congestion. Denies history of strep throat , allergies , or ET dysfunction. No fever, chills, cough, or N&V, shortness of breath or changes to hearing.  No past medical history on file.  Patient Active Problem List   Diagnosis Date Noted  . 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 Behavior concern     Past Surgical History:  Procedure Laterality Date  . NO PAST SURGERIES      OB History    Gravida  0   Para  0   Term  0   Preterm  0   AB  0   Living  0     SAB  0   TAB  0   Ectopic  0   Multiple  0   Live Births               Home Medications    Prior to Admission medications   Medication Sig Start Date End Date Taking? Authorizing Provider  amoxicillin (AMOXIL) 875 MG tablet Take 1 tablet (875 mg total) by mouth 2 (two) times daily. 07/16/19   Bing Neighbors, FNP  ipratropium (ATROVENT) 0.03 % nasal spray Place 2 sprays into both nostrils 2 (two) times daily. 07/16/19   Bing Neighbors, FNP  metroNIDAZOLE (FLAGYL) 500 MG tablet Take 1 tablet (500 mg total) by mouth 2 (two) times daily. 04/24/19   Merrilee Jansky, MD  cetirizine (ZYRTEC) 10 MG tablet Take 1 tablet (10 mg total) by mouth daily. 02/11/19 04/04/19  Georgetta Haber, NP  fluticasone (FLONASE) 50 MCG/ACT nasal spray Place 1 spray into both nostrils daily. 02/11/19 04/04/19  Georgetta Haber, NP  medroxyPROGESTERone (DEPO-PROVERA) 150 MG/ML injection Inject 150 mg into the muscle every 3 (three) months.  11/24/18  [provider]  sodium chloride (OCEAN) 0.65 % SOLN nasal spray Place 1 spray into both nostrils as needed for congestion. And to moisturize nose 02/11/19 04/04/19  Georgetta Haber, NP    Family History Family History  Problem Relation Age of Onset  . Sickle cell anemia Mother     Social History Social History   Tobacco Use  . Smoking status: Never Smoker  . Smokeless tobacco: Never Used  Substance Use Topics  . Alcohol use: No  . Drug use: No     Allergies   Patient has no known allergies.   Review of Systems Review of Systems Pertinent negatives listed in HPI Physical Exam Triage Vital Signs ED Triage Vitals  Enc Vitals Group     BP 07/16/19 1731 116/74     Pulse Rate 07/16/19 1731 80     Resp 07/16/19 1731 16     Temp 07/16/19 1731 99.3 F (37.4 C)     Temp Source 07/16/19 1731 Oral     SpO2 07/16/19 1731 99 %     Weight --      Height --      Head Circumference --  Peak Flow --      Pain Score 07/16/19 1739 8     Pain Loc --      Pain Edu? --      Excl. in [place]? --    No data found.  Updated Vital Signs BP 116/74 (BP Location: Left Arm)   Pulse 80   Temp 99.3 F (37.4 C) (Oral)   Resp 16   SpO2 99%   Visual Acuity Right Eye Distance:   Left Eye Distance:   Bilateral Distance:    Right Eye Near:   Left Eye Near:    Bilateral Near:     Physical Exam Constitutional:      Appearance: Normal appearance.  HENT:     Right Ear: Swelling and tenderness present. A middle ear effusion is present.     Mouth/Throat:     Pharynx: Pharyngeal swelling and posterior oropharyngeal erythema present. No oropharyngeal exudate or uvula swelling.     Tonsils: No tonsillar exudate or tonsillar abscesses. 2+ on the right. 2+ on the left.  Cardiovascular:     Rate and Rhythm: Normal rate.  Musculoskeletal:     Cervical back: Normal range of motion.  Lymphadenopathy:     Cervical: Cervical adenopathy present.  Neurological:     Mental Status: She is alert.       UC Treatments / Results  Labs (all labs ordered are listed, but only abnormal results are displayed) Labs Reviewed  CULTURE, GROUP A STREP [facility])    EKG   Radiology No results found.  Procedures Procedures (including critical care time)  Medications Ordered in UC Medications - No data to display  Initial Impression / Assessment and Plan / UC Course  I have reviewed the triage vital signs and the nursing notes.  Pertinent labs & imaging results that were available during my care of the patient were reviewed by me and considered in my medical decision making (see chart for details).      Non-recurrent acute serous otitis media of right ear Acute pharyngitis, unspecified etiology  - Amoxicillin 875 mg BID x 10 days  -Throat Culture pending   Nasal congestion -Atrovent nasal spray 2 sprays BID PRN    Final Clinical Impressions(s) / UC Diagnoses   Final diagnoses:  Non-recurrent acute serous otitis media of right ear  Acute pharyngitis, unspecified etiology  Nasal congestion   Discharge Instructions   None    ED Prescriptions    Medication Sig Dispense Auth. Provider   amoxicillin (AMOXIL) 875 MG tablet Take 1 tablet (875 mg total) by mouth 2 (two) times daily. 20 tablet Scot Jun, FNP   ipratropium (ATROVENT) 0.03 % nasal spray Place 2 sprays into both nostrils 2 (two) times daily. 30 mL Scot Jun, FNP     PDMP not reviewed this encounter.   Scot Jun, FNP 07/18/19 0848    Scot Jun, FNP 07/18/19 [phone]

## 2019-07-19 LAB — CULTURE, GROUP A STREP (THRC)

## 2019-07-21 ENCOUNTER — Other Ambulatory Visit: Payer: Self-pay

## 2019-07-21 ENCOUNTER — Encounter: Payer: Self-pay | Admitting: Certified Nurse Midwife

## 2019-07-21 ENCOUNTER — Ambulatory Visit: Payer: No Typology Code available for payment source | Admitting: Certified Nurse Midwife

## 2019-07-21 VITALS — BP 111/70 | HR 84 | Ht 64.0 in | Wt 149.2 lb

## 2019-07-21 DIAGNOSIS — N939 Abnormal uterine and vaginal bleeding, unspecified: Secondary | ICD-10-CM | POA: Diagnosis not present

## 2019-07-21 DIAGNOSIS — Z01419 Encounter for gynecological examination (general) (routine) without abnormal findings: Secondary | ICD-10-CM

## 2019-07-21 NOTE — Progress Notes (Signed)
Pt presents as a new patient for irregular cycles. Pt states that she had some heavy bleeding after stopping depo shot. She got her last depo inj June 2020. She states that she started her cycle started back in November. She states that the heavy bleeding started 12/16-12/29, and January was heavy as well. She is not interested in starting in starting a new birth control method.

## 2019-07-21 NOTE — Progress Notes (Signed)
GYNECOLOGY ANNUAL PREVENTATIVE CARE ENCOUNTER NOTE  History:     Denise Tanner is a 19 y.o. G0P0000 female here for a routine annual gynecologic exam.  Current complaints: irregular cycles after stopping depo.   Denies abnormal discharge, pelvic pain, problems with intercourse or other gynecologic concerns. Patient declines STD screening today.    Gynecologic History Patient's last menstrual period was 07/12/2019. Contraception: condoms  Obstetric History OB History  Gravida Para Term Preterm AB Living  0 0 0 0 0 0  SAB TAB Ectopic Multiple Live Births  0 0 0 0      History reviewed. No pertinent past medical history.  Past Surgical History:  Procedure Laterality Date  . NO PAST SURGERIES      Current Outpatient Medications on File Prior to Visit  Medication Sig Dispense Refill  . amoxicillin (AMOXIL) 875 MG tablet Take 1 tablet (875 mg total) by mouth 2 (two) times daily. 20 tablet 0  . ipratropium (ATROVENT) 0.03 % nasal spray Place 2 sprays into both nostrils 2 (two) times daily. 30 mL 0  . [DISCONTINUED] cetirizine (ZYRTEC) 10 MG tablet Take 1 tablet (10 mg total) by mouth daily. 30 tablet 0  . [DISCONTINUED] fluticasone (FLONASE) 50 MCG/ACT nasal spray Place 1 spray into both nostrils daily. 16 g 2  . [DISCONTINUED] medroxyPROGESTERone (DEPO-PROVERA) 150 MG/ML injection Inject 150 mg into the muscle every 3 (three) months.    . [DISCONTINUED] sodium chloride (OCEAN) 0.65 % SOLN nasal spray Place 1 spray into both nostrils as needed for congestion. And to moisturize nose 60 mL 0   No current facility-administered medications on file prior to visit.    No Known Allergies  Social History:  reports that she has never smoked. She has never used smokeless tobacco. She reports that she does not drink alcohol or use drugs.  Family History  Problem Relation Age of Onset  . Sickle cell anemia Mother     The following portions of the patient's history were reviewed and  updated as appropriate: allergies, current medications, past family history, past medical history, past social history, past surgical history and problem list.  Review of Systems Pertinent items noted in HPI and remainder of comprehensive ROS otherwise negative.  Physical Exam:  BP 111/70   Pulse 84   Ht 5\' 4"  (1.626 m)   Wt 149 lb 3.2 oz (67.7 kg)   LMP 07/12/2019   BMI 25.61 kg/m  CONSTITUTIONAL: Well-developed, well-nourished female in no acute distress.  HENT:  Normocephalic, atraumatic, External right and left ear normal. Oropharynx is clear and moist EYES: Conjunctivae and EOM are normal. Pupils are equal, round, and reactive to light.Marland Kitchen  NECK: Normal range of motion, supple, no masses.  Normal thyroid.  SKIN: Skin is warm and dry. No rash noted. Not diaphoretic. No erythema. No pallor. MUSCULOSKELETAL: Normal range of motion. No tenderness.  No cyanosis, clubbing, or edema.  2+ distal pulses. NEUROLOGIC: Alert and oriented to person, place, and time. Normal reflexes, muscle tone coordination.  PSYCHIATRIC: Normal mood and affect. Normal behavior. Normal judgment and thought content. CARDIOVASCULAR: Normal heart rate noted, regular rhythm RESPIRATORY: Clear to auscultation bilaterally. Effort and breath sounds normal, no problems with respiration noted. ABDOMEN: Soft, no distention noted.  No rebound or guarding. Uterus nontender, nonenlarged, right adnexa with tenderness,  No enlargement, or mass palpated. Left adnexa without tenderness, enlargement or mass.     Assessment and Plan:    1. Encounter for annual routine gynecological examination -  AUB and pelvic tenderness found on today's examination   2. Abnormal uterine bleeding (AUB) - Patient reports since stopping Depo in June 2020 has been having irregular cycles  - Reports no cycle throughout 3 year period while on depo and 5 months after stopping depo  - Reports cycle started back in November and since have been irregular  and heavier. Patient reports cycles occur for 6 days and having to change pad every 1-2 hours (denies pads being soaked when she changes them)  - Patient reports LMP being Feb 3rd through 7th (5 days)  - Educated and discussed that after cessation of depo it can take up to 6 months to regular cycles  - Will obtain labs and [place] to assess for abnormalities  - US PELVIC COMPLETE WITH TRANSVAGINAL; Future - CBC - TSH - Prolactin  Will follow up results of labs/US and manage accordingly. [place] ordered and scheduled  Routine preventative health maintenance measures emphasized. Please refer to After Visit Summary for other counseling recommendations.       Sharyon Cable, CNM [facility] [employer]

## 2019-07-22 LAB — CBC
Hematocrit: 42 % (ref 34.0–46.6)
Hemoglobin: 13.7 g/dL (ref 11.1–15.9)
MCH: 27 pg (ref 26.6–33.0)
MCHC: 32.6 g/dL (ref 31.5–35.7)
MCV: 83 fL (ref 79–97)
Platelets: 272 10*3/uL (ref 150–450)
RBC: 5.07 x10E6/uL (ref 3.77–5.28)
RDW: 12.4 % (ref 11.7–15.4)
WBC: 7.1 10*3/uL (ref 3.4–10.8)

## 2019-07-22 LAB — PROLACTIN: Prolactin: 27.4 ng/mL — ABNORMAL HIGH (ref 4.8–23.3)

## 2019-07-22 LAB — TSH: TSH: 4.47 u[IU]/mL (ref 0.450–4.500)

## 2019-07-28 ENCOUNTER — Ambulatory Visit (HOSPITAL_COMMUNITY): Payer: No Typology Code available for payment source | Attending: Certified Nurse Midwife

## 2019-07-28 ENCOUNTER — Encounter (HOSPITAL_COMMUNITY): Payer: Self-pay | Admitting: Emergency Medicine

## 2019-07-28 ENCOUNTER — Other Ambulatory Visit: Payer: Self-pay

## 2019-07-28 ENCOUNTER — Ambulatory Visit (HOSPITAL_COMMUNITY)
Admission: EM | Admit: 2019-07-28 | Discharge: 2019-07-28 | Disposition: A | Discharge status: Home / Self Care | Payer: No Typology Code available for payment source | Attending: Family Medicine | Admitting: Family Medicine

## 2019-07-28 DIAGNOSIS — J358 Other chronic diseases of tonsils and adenoids: Secondary | ICD-10-CM

## 2019-07-28 DIAGNOSIS — J020 Streptococcal pharyngitis: Secondary | ICD-10-CM | POA: Diagnosis not present

## 2019-07-28 MED ORDER — AMOXICILLIN 875 MG PO TABS: 875.0000 mg | ORAL_TABLET | Freq: Two times a day (BID) | ORAL | 0 refills | Status: DC

## 2019-07-28 NOTE — Discharge Instructions (Addendum)
Salt water gargles 2-3 times a day Take full 10 days of antibiotic Return as needed

## 2019-07-28 NOTE — ED Triage Notes (Signed)
Pt states she was seen here 12 days ago, was given amoxicillin for ear infection. Pt states she stopped taking the medication after only 4 days because she felt better. Pain has come back. Sore throat, ear pain.

## 2019-07-28 NOTE — ED Provider Notes (Signed)
[facility]    CSN: [id number] Arrival date & time: 07/28/19  1834      History   Chief Complaint Chief Complaint  Patient presents with  . Otalgia  . Sore Throat    HPI Denise Tanner is a 19 y.o. female.   HPI  Patient is here for follow-up.  She is seen on 07/16/2019.  She was diagnosed with a streptococcal, nongroup A related tonsillitis.  He was treated with amoxicillin.  She took the amoxicillin for 2 to 3 days, felt better, then stopped her amoxicillin.  She threw it away.  Now a few days later, she is complaining of sore throat, swollen tonsils, pus on her tonsils, and ear pain.  History reviewed. No pertinent past medical history.  Patient Active Problem List   Diagnosis Date Noted  . 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 Behavior concern     Past Surgical History:  Procedure Laterality Date  . NO PAST SURGERIES      OB History    Gravida  0   Para  0   Term  0   Preterm  0   AB  0   Living  0     SAB  0   TAB  0   Ectopic  0   Multiple  0   Live Births               Home Medications    Prior to Admission medications   Medication Sig Start Date End Date Taking? Authorizing Provider  amoxicillin (AMOXIL) 875 MG tablet Take 1 tablet (875 mg total) by mouth 2 (two) times daily. 07/28/19   Eustace Moore, MD  ipratropium (ATROVENT) 0.03 % nasal spray Place 2 sprays into both nostrils 2 (two) times daily. 07/16/19   Bing Neighbors, FNP  cetirizine (ZYRTEC) 10 MG tablet Take 1 tablet (10 mg total) by mouth daily. 02/11/19 04/04/19  Georgetta Haber, NP  fluticasone (FLONASE) 50 MCG/ACT nasal spray Place 1 spray into both nostrils daily. 02/11/19 04/04/19  Georgetta Haber, NP  medroxyPROGESTERone (DEPO-PROVERA) 150 MG/ML injection Inject 150 mg into the muscle every 3 (three) months.  11/24/18  [provider]  sodium chloride (OCEAN) 0.65 % SOLN nasal spray Place 1 spray into  both nostrils as needed for congestion. And to moisturize nose 02/11/19 04/04/19  Georgetta Haber, NP    Family History Family History  Problem Relation Age of Onset  . Sickle cell anemia Mother     Social History Social History   Tobacco Use  . Smoking status: Never Smoker  . Smokeless tobacco: Never Used  Substance Use Topics  . Alcohol use: No  . Drug use: No     Allergies   Patient has no known allergies.   Review of Systems Review of Systems  Constitutional: Negative for chills and fever.  HENT: Positive for ear pain, sore throat and trouble swallowing. Negative for congestion.   Gastrointestinal: Negative for abdominal pain.  Neurological: Negative for headaches.     Physical Exam Triage Vital Signs ED Triage Vitals  Enc Vitals Group     BP 07/28/19 1900 106/66     Pulse Rate 07/28/19 1900 (!) 105     Resp 07/28/19 1900 18     Temp 07/28/19 1900 98.9 F (37.2 C)     Temp src --      SpO2 07/28/19 1900 100 %  Weight --      Height --      Head Circumference --      Peak Flow --      Pain Score 07/28/19 1901 10     Pain Loc --      Pain Edu? --      Excl. in [place]? --    No data found.  Updated Vital Signs BP 106/66   Pulse (!) 105   Temp 98.9 F (37.2 C)   Resp 18   LMP 07/12/2019   SpO2 100%   Physical Exam Constitutional:      General: She is not in acute distress.    Appearance: She is well-developed. She is not ill-appearing.  HENT:     Head: Normocephalic and atraumatic.     Right Ear: Tympanic membrane and ear canal normal.     Left Ear: Tympanic membrane and ear canal normal.     Nose: No congestion.     Mouth/Throat:     Mouth: Mucous membranes are moist.     Pharynx: Posterior oropharyngeal erythema present.     Tonsils: No tonsillar exudate or tonsillar abscesses. 2+ on the right. 2+ on the left.     Comments: Tonsil stones are noted Eyes:     Conjunctiva/sclera: Conjunctivae normal.     Pupils: Pupils are equal, round,  and reactive to light.  Cardiovascular:     Rate and Rhythm: Normal rate and regular rhythm.     Heart sounds: Normal heart sounds.  Pulmonary:     Effort: Pulmonary effort is normal. No respiratory distress.     Breath sounds: Normal breath sounds.  Abdominal:     Palpations: Abdomen is soft.  Musculoskeletal:        General: Normal range of motion.     Cervical back: Normal range of motion.  Lymphadenopathy:     Cervical: Cervical adenopathy present.  Skin:    General: Skin is warm and dry.  Neurological:     Mental Status: She is alert.  Psychiatric:        Mood and Affect: Mood normal.        Behavior: Behavior normal.      UC Treatments / Results  Labs (all labs ordered are listed, but only abnormal results are displayed) Labs Reviewed - No data to display  EKG   Radiology No results found.  Procedures Procedures (including critical care time)  Medications Ordered in UC Medications - No data to display  Initial Impression / Assessment and Plan / UC Course  I have reviewed the triage vital signs and the nursing notes.  Pertinent labs & imaging results that were available during my care of the patient were reviewed by me and considered in my medical decision making (see chart for details).     Advised patient to take 10 full days of antibiotics Discussed tonsil stones.  Harmless. Final Clinical Impressions(s) / UC Diagnoses   Final diagnoses:  Strep pharyngitis  Tonsil stone     Discharge Instructions     Salt water gargles 2-3 times a day Take full 10 days of antibiotic Return as needed   ED Prescriptions    Medication Sig Dispense Auth. Provider   amoxicillin (AMOXIL) 875 MG tablet Take 1 tablet (875 mg total) by mouth 2 (two) times daily. 20 tablet Raylene Everts, MD     PDMP not reviewed this encounter.   Raylene Everts, MD 07/28/19 2106

## 2019-08-07 ENCOUNTER — Other Ambulatory Visit: Payer: Self-pay

## 2019-08-07 ENCOUNTER — Ambulatory Visit (HOSPITAL_COMMUNITY)
Admission: RE | Admit: 2019-08-07 | Discharge: 2019-08-07 | Disposition: A | Discharge status: Home / Self Care | Payer: No Typology Code available for payment source | Source: Ambulatory Visit | Attending: Certified Nurse Midwife | Admitting: Certified Nurse Midwife

## 2019-08-07 DIAGNOSIS — N939 Abnormal uterine and vaginal bleeding, unspecified: Secondary | ICD-10-CM | POA: Insufficient documentation

## 2019-08-09 ENCOUNTER — Encounter: Payer: Self-pay | Admitting: Certified Nurse Midwife

## 2019-08-10 ENCOUNTER — Other Ambulatory Visit: Payer: Self-pay

## 2019-08-10 DIAGNOSIS — E039 Hypothyroidism, unspecified: Secondary | ICD-10-CM

## 2019-08-12 ENCOUNTER — Encounter: Payer: Self-pay | Admitting: Certified Nurse Midwife

## 2019-08-14 ENCOUNTER — Encounter: Payer: Self-pay | Admitting: Certified Nurse Midwife

## 2019-08-14 ENCOUNTER — Other Ambulatory Visit: Payer: Self-pay | Admitting: Certified Nurse Midwife

## 2019-08-14 DIAGNOSIS — R7989 Other specified abnormal findings of blood chemistry: Secondary | ICD-10-CM

## 2019-08-21 ENCOUNTER — Other Ambulatory Visit: Payer: No Typology Code available for payment source

## 2019-08-21 ENCOUNTER — Other Ambulatory Visit: Payer: Self-pay

## 2019-08-21 DIAGNOSIS — R7989 Other specified abnormal findings of blood chemistry: Secondary | ICD-10-CM

## 2019-08-22 ENCOUNTER — Encounter: Payer: Self-pay | Admitting: Certified Nurse Midwife

## 2019-08-22 LAB — THYROID PANEL WITH TSH
Free Thyroxine Index: 1.4 (ref 1.2–4.9)
T3 Uptake Ratio: 27 % (ref 24–39)
T4, Total: 5.1 ug/dL (ref 4.5–12.0)
TSH: 1.48 u[IU]/mL (ref 0.450–4.500)

## 2019-08-22 LAB — PROLACTIN: Prolactin: 24.1 ng/mL — ABNORMAL HIGH (ref 4.8–23.3)

## 2019-08-24 ENCOUNTER — Encounter: Payer: Self-pay | Admitting: Certified Nurse Midwife

## 2019-08-24 NOTE — Telephone Encounter (Signed)
The Prolactin is elevated, and the TSH is normal.  She needs follow up with [place] because I don't know why she ordered these labs.  I didn't see where the patient c/o headaches or visual changes or amenorrhea.

## 2019-08-25 ENCOUNTER — Encounter: Payer: Self-pay | Admitting: Certified Nurse Midwife

## 2019-08-29 ENCOUNTER — Encounter: Payer: Self-pay | Admitting: Certified Nurse Midwife

## 2019-08-30 ENCOUNTER — Encounter: Payer: Self-pay | Admitting: Certified Nurse Midwife

## 2019-09-03 ENCOUNTER — Encounter (HOSPITAL_COMMUNITY): Payer: Self-pay | Admitting: Emergency Medicine

## 2019-09-03 ENCOUNTER — Other Ambulatory Visit: Payer: Self-pay

## 2019-09-03 ENCOUNTER — Ambulatory Visit (HOSPITAL_COMMUNITY)
Admission: EM | Admit: 2019-09-03 | Discharge: 2019-09-03 | Disposition: A | Discharge status: Home / Self Care | Payer: No Typology Code available for payment source | Attending: Family Medicine | Admitting: Family Medicine

## 2019-09-03 DIAGNOSIS — Z3202 Encounter for pregnancy test, result negative: Secondary | ICD-10-CM

## 2019-09-03 DIAGNOSIS — N760 Acute vaginitis: Secondary | ICD-10-CM | POA: Insufficient documentation

## 2019-09-03 LAB — POCT URINALYSIS DIP (DEVICE)
Bilirubin Urine: NEGATIVE
Glucose, UA: NEGATIVE mg/dL
Hgb urine dipstick: NEGATIVE
Ketones, ur: NEGATIVE mg/dL
Nitrite: NEGATIVE
Protein, ur: NEGATIVE mg/dL
Specific Gravity, Urine: 1.025 (ref 1.005–1.030)
Urobilinogen, UA: 0.2 mg/dL (ref 0.0–1.0)
pH: 7 (ref 5.0–8.0)

## 2019-09-03 LAB — POC URINE PREG, ED
Preg Test, Ur: NEGATIVE
Preg Test, Ur: NEGATIVE

## 2019-09-03 LAB — POCT PREGNANCY, URINE: Preg Test, Ur: NEGATIVE

## 2019-09-03 MED ORDER — FLUCONAZOLE 150 MG PO TABS: 150.0000 mg | ORAL_TABLET | Freq: Every day | ORAL | 0 refills | Status: DC

## 2019-09-03 NOTE — ED Provider Notes (Signed)
[facility]    CSN: [id number] Arrival date & time: 09/03/19  1234      History   Chief Complaint Chief Complaint  Patient presents with  . Vaginal Discharge    HPI Denise Tanner is a 19 y.o. female.   HPI  Patient states she is a history of a yeast infection.  Has had some vaginal discharge and irritation for 2 days.  She thinks it is her yeast infection coming back.  She would like a prescription for Diflucan. Patient has had unprotected sexual relations.  Her menstrual period is due in 4 days.  She does not think she is pregnant but cannot be certain.  She does not think she has an STD but also cannot be certain.  She agrees to pregnancy test and STD testing while she is here for treatment of her vaginitis. No dysuria, frequency, pelvic pain, fever or chills. History reviewed. No pertinent past medical history.  Patient Active Problem List   Diagnosis Date Noted  . 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 Behavior concern     Past Surgical History:  Procedure Laterality Date  . NO PAST SURGERIES      OB History    Gravida  0   Para  0   Term  0   Preterm  0   AB  0   Living  0     SAB  0   TAB  0   Ectopic  0   Multiple  0   Live Births               Home Medications    Prior to Admission medications   Medication Sig Start Date End Date Taking? Authorizing Provider  fluconazole (DIFLUCAN) 150 MG tablet Take 1 tablet (150 mg total) by mouth daily. Repeat in 1 week if needed 09/03/19   Eustace Moore, MD  ipratropium (ATROVENT) 0.03 % nasal spray Place 2 sprays into both nostrils 2 (two) times daily. 07/16/19   Bing Neighbors, FNP  cetirizine (ZYRTEC) 10 MG tablet Take 1 tablet (10 mg total) by mouth daily. 02/11/19 04/04/19  Georgetta Haber, NP  fluticasone (FLONASE) 50 MCG/ACT nasal spray Place 1 spray into both nostrils daily. 02/11/19 04/04/19  Georgetta Haber, NP    medroxyPROGESTERone (DEPO-PROVERA) 150 MG/ML injection Inject 150 mg into the muscle every 3 (three) months.  11/24/18  [provider]  sodium chloride (OCEAN) 0.65 % SOLN nasal spray Place 1 spray into both nostrils as needed for congestion. And to moisturize nose 02/11/19 04/04/19  Georgetta Haber, NP    Family History Family History  Problem Relation Age of Onset  . Sickle cell anemia Mother     Social History Social History   Tobacco Use  . Smoking status: Never Smoker  . Smokeless tobacco: Never Used  Substance Use Topics  . Alcohol use: No  . Drug use: No     Allergies   Patient has no known allergies.   Review of Systems Review of Systems  Genitourinary: Positive for vaginal discharge.  See HPI  Physical Exam Triage Vital Signs ED Triage Vitals  Enc Vitals Group     BP 09/03/19 1248 111/77     Pulse Rate 09/03/19 1248 (!) 118     Resp 09/03/19 1248 18     Temp 09/03/19 1248 99.5 F (37.5 C)     Temp Source 09/03/19 1248 Oral  SpO2 09/03/19 1248 97 %     Weight --      Height --      Head Circumference --      Peak Flow --      Pain Score 09/03/19 1249 8     Pain Loc --      Pain Edu? --      Excl. in [place]? --    No data found.  Updated Vital Signs BP 111/77 (BP Location: Right Arm)   Pulse (!) 118   Temp 99.5 F (37.5 C) (Oral)   Resp 18   SpO2 97%   Visual Acuity Right Eye Distance:   Left Eye Distance:   Bilateral Distance:    Right Eye Near:   Left Eye Near:    Bilateral Near:     Physical Exam Constitutional:      General: She is not in acute distress.    Appearance: She is well-developed.     Comments: Exam by observation  HENT:     Head: Normocephalic and atraumatic.     Mouth/Throat:     Comments: Mask is in place Eyes:     Conjunctiva/sclera: Conjunctivae normal.     Pupils: Pupils are equal, round, and reactive to light.  Cardiovascular:     Rate and Rhythm: Normal rate.  Pulmonary:     Effort: Pulmonary  effort is normal. No respiratory distress.  Genitourinary:    Comments: Self swab is done Musculoskeletal:        General: Normal range of motion.     Cervical back: Normal range of motion.  Skin:    General: Skin is warm and dry.  Neurological:     Mental Status: She is alert.  Psychiatric:        Mood and Affect: Mood normal.        Behavior: Behavior normal.      UC Treatments / Results  Labs (all labs ordered are listed, but only abnormal results are displayed) Labs Reviewed  POCT URINALYSIS DIP (DEVICE) - Abnormal; Notable for the following components:      Result Value   Leukocytes,Ua SMALL (*)    All other components within normal limits  POC URINE PREG, ED  POCT PREGNANCY, URINE  POC URINE PREG, ED  CERVICOVAGINAL ANCILLARY ONLY    EKG   Radiology No results found.  Procedures Procedures (including critical care time)  Medications Ordered in UC Medications - No data to display  Initial Impression / Assessment and Plan / UC Course  I have reviewed the triage vital signs and the nursing notes.  Pertinent labs & imaging results that were available during my care of the patient were reviewed by me and considered in my medical decision making (see chart for details).      Final Clinical Impressions(s) / UC Diagnoses   Final diagnoses:  Acute vaginitis     Discharge Instructions     Take the diflucan for the yeast infection You will be called if your cultures are positive You can check for your test result on MyChart   ED Prescriptions    Medication Sig Dispense Auth. Provider   fluconazole (DIFLUCAN) 150 MG tablet Take 1 tablet (150 mg total) by mouth daily. Repeat in 1 week if needed 2 tablet Raylene Everts, MD     PDMP not reviewed this encounter.   Raylene Everts, MD 09/03/19 1330

## 2019-09-03 NOTE — Discharge Instructions (Signed)
Take the diflucan for the yeast infection You will be called if your cultures are positive You can check for your test result on MyChart

## 2019-09-03 NOTE — ED Triage Notes (Signed)
Pt here for vaginal discharge and irritation 

## 2019-09-04 ENCOUNTER — Encounter: Payer: Self-pay | Admitting: Certified Nurse Midwife

## 2019-09-05 ENCOUNTER — Encounter: Payer: Self-pay | Admitting: Certified Nurse Midwife

## 2019-09-05 LAB — CERVICOVAGINAL ANCILLARY ONLY
Chlamydia: NEGATIVE
Neisseria Gonorrhea: NEGATIVE
Trichomonas: NEGATIVE

## 2019-09-06 ENCOUNTER — Encounter: Payer: Self-pay | Admitting: Certified Nurse Midwife

## 2019-09-15 ENCOUNTER — Encounter: Payer: Self-pay | Admitting: Certified Nurse Midwife

## 2019-09-18 ENCOUNTER — Ambulatory Visit (INDEPENDENT_AMBULATORY_CARE_PROVIDER_SITE_OTHER): Payer: No Typology Code available for payment source

## 2019-09-18 ENCOUNTER — Encounter: Payer: Self-pay | Admitting: Certified Nurse Midwife

## 2019-09-18 ENCOUNTER — Other Ambulatory Visit: Payer: Self-pay

## 2019-09-18 ENCOUNTER — Other Ambulatory Visit (HOSPITAL_COMMUNITY)
Admission: RE | Admit: 2019-09-18 | Discharge: 2019-09-18 | Disposition: A | Discharge status: Home / Self Care | Payer: No Typology Code available for payment source | Source: Ambulatory Visit

## 2019-09-18 VITALS — BP 124/73 | HR 91 | Ht 64.0 in | Wt 156.7 lb

## 2019-09-18 DIAGNOSIS — B9689 Other specified bacterial agents as the cause of diseases classified elsewhere: Secondary | ICD-10-CM | POA: Diagnosis present

## 2019-09-18 DIAGNOSIS — N760 Acute vaginitis: Secondary | ICD-10-CM | POA: Insufficient documentation

## 2019-09-18 MED ORDER — METRONIDAZOLE 500 MG PO TABS: 500.0000 mg | ORAL_TABLET | Freq: Two times a day (BID) | ORAL | 0 refills | Status: DC

## 2019-09-18 NOTE — Progress Notes (Signed)
History:  [name] is a 19 y.o. [id number] who presents to clinic today for malodorous discharge for two weeks. She reports that she has had white discharge since 3/26 and that the odor is "fishy" and has become increasingly strong over the past week. He LMP was 3/29. She was seen in the ED on 3/28 and was prescribed Diflucan with no relief of symptoms. She was also tested for GC/Chlamydia and Trich at that time and all were negative. UA was significant only for small leukocytes. She has not had sex since that time. She is sexually active with one partner and reports that they use condoms consistently.    Review of Systems:  Review of Systems  Constitutional: Negative for chills, diaphoresis and fever.  HENT: Negative.   Eyes: Negative.   Respiratory: Negative.   Cardiovascular: Negative.  Negative for palpitations.  Gastrointestinal: Negative for nausea and vomiting.  Genitourinary: Negative for dysuria.  Musculoskeletal: Negative.   Skin: Negative.   Neurological: Negative for headaches.  Endo/Heme/Allergies: Negative.   Psychiatric/Behavioral: Negative.       Objective:  Physical Exam BP 124/73   Pulse 91   Ht 5\' 4"  (1.626 m)   Wt 156 lb 11.2 oz (71.1 kg)   LMP 09/04/2019 (Exact Date)   BMI 26.90 kg/m  Physical Exam Exam conducted with a chaperone present.  Constitutional:      Appearance: Normal appearance.  Genitourinary:    General: Normal vulva.     Vagina: Vaginal discharge present.     Cervix: Erythema present. No cervical motion tenderness, lesion or cervical bleeding.  Neurological:     Mental Status: She is alert.      Labs and Imaging No results found for this or any previous visit (from the past 24 hour(s)).  No results found.   Assessment & Plan:  Denise Tanner was seen today for vaginal discharge.  Diagnoses and all orders for this visit:  Bacterial vaginosis  -Oral Flagyl 500mg  bid for 7 days    Denise Tanner, Medical  Student 09/18/2019 4:32 PM

## 2019-09-18 NOTE — Assessment & Plan Note (Signed)
-   Oral Flagyl 500mg  bid for 7 days

## 2019-09-18 NOTE — Progress Notes (Signed)
GYN presents for malodorous, yellowish vaginal discharge x 5 days. The burning and itching stopped 3 days ago.  Denies abdominal/pelvic pain, fever, NV, chills.

## 2019-09-19 LAB — CERVICOVAGINAL ANCILLARY ONLY
Bacterial Vaginitis (gardnerella): POSITIVE — AB
Comment: NEGATIVE

## 2019-11-05 ENCOUNTER — Other Ambulatory Visit: Payer: Self-pay

## 2019-11-05 ENCOUNTER — Ambulatory Visit (HOSPITAL_COMMUNITY)
Admission: EM | Admit: 2019-11-05 | Discharge: 2019-11-05 | Disposition: A | Discharge status: Home / Self Care | Payer: No Typology Code available for payment source | Attending: Emergency Medicine | Admitting: Emergency Medicine

## 2019-11-05 ENCOUNTER — Encounter (HOSPITAL_COMMUNITY): Payer: Self-pay

## 2019-11-05 DIAGNOSIS — R300 Dysuria: Secondary | ICD-10-CM | POA: Insufficient documentation

## 2019-11-05 DIAGNOSIS — R102 Pelvic and perineal pain: Secondary | ICD-10-CM

## 2019-11-05 DIAGNOSIS — R350 Frequency of micturition: Secondary | ICD-10-CM | POA: Diagnosis present

## 2019-11-05 LAB — POCT URINALYSIS DIP (DEVICE)
Bilirubin Urine: NEGATIVE
Glucose, UA: NEGATIVE mg/dL
Ketones, ur: NEGATIVE mg/dL
Nitrite: NEGATIVE
Protein, ur: NEGATIVE mg/dL
Specific Gravity, Urine: 1.025 (ref 1.005–1.030)
Urobilinogen, UA: 0.2 mg/dL (ref 0.0–1.0)
pH: 7 (ref 5.0–8.0)

## 2019-11-05 MED ORDER — PHENAZOPYRIDINE HCL 200 MG PO TABS: 200.0000 mg | ORAL_TABLET | Freq: Three times a day (TID) | ORAL | 0 refills | Status: DC

## 2019-11-05 MED ORDER — NITROFURANTOIN MONOHYD MACRO 100 MG PO CAPS: 100.0000 mg | ORAL_CAPSULE | Freq: Two times a day (BID) | ORAL | 0 refills | Status: DC

## 2019-11-05 NOTE — ED Triage Notes (Signed)
C/o burning with urination, frequent urination, and inability to completely empty bladder.

## 2019-11-05 NOTE — ED Provider Notes (Signed)
[facility]   CC: UTI  SUBJECTIVE:  Denise Tanner is a 19 y.o. female who complains of urinary frequency, urgency and dysuria for the past 5 days. Patient denies a precipitating event, recent sexual encounter, excessive caffeine intake. Localizes the pain to the lower abdomen. Pain is intermittent and describes it as burning. Has tried OTC medications without relief. Symptoms are made worse with urination. Admits to similar symptoms in the past. Denies fever, chills, nausea, vomiting, abdominal pain, flank pain, abnormal vaginal discharge or bleeding, hematuria.    LMP: No LMP recorded.  ROS: As in HPI.  All other pertinent ROS negative.     History reviewed. No pertinent past medical history. Past Surgical History:  Procedure Laterality Date  . NO PAST SURGERIES     No Known Allergies No current facility-administered medications on file prior to encounter.   Current Outpatient Medications on File Prior to Encounter  Medication Sig Dispense Refill  . fluconazole (DIFLUCAN) 150 MG tablet Take 1 tablet (150 mg total) by mouth daily. Repeat in 1 week if needed (Patient not taking: Reported on 09/18/2019) 2 tablet 0  . ipratropium (ATROVENT) 0.03 % nasal spray Place 2 sprays into both nostrils 2 (two) times daily. 30 mL 0  . metroNIDAZOLE (FLAGYL) 500 MG tablet Take 1 tablet (500 mg total) by mouth 2 (two) times daily. 14 tablet 0  . [DISCONTINUED] cetirizine (ZYRTEC) 10 MG tablet Take 1 tablet (10 mg total) by mouth daily. 30 tablet 0  . [DISCONTINUED] fluticasone (FLONASE) 50 MCG/ACT nasal spray Place 1 spray into both nostrils daily. 16 g 2  . [DISCONTINUED] medroxyPROGESTERone (DEPO-PROVERA) 150 MG/ML injection Inject 150 mg into the muscle every 3 (three) months.    . [DISCONTINUED] sodium chloride (OCEAN) 0.65 % SOLN nasal spray Place 1 spray into both nostrils as needed for congestion. And to moisturize nose 60 mL 0    Family History  Problem Relation Age of Onset    . Sickle cell anemia Mother     OBJECTIVE:  Vitals:   11/05/19 1317  BP: 113/64  Pulse: 96  Resp: 14  Temp: 98.9 F (37.2 C)  TempSrc: Oral  SpO2: 100%   General appearance: AOx3 in no acute distress HEENT: NCAT.  Oropharynx clear.  Lungs: clear to auscultation bilaterally without adventitious breath sounds Heart: regular rate and rhythm.  Radial pulses 2+ symmetrical bilaterally Abdomen: soft; non-distended;suprapubic tenderness; bowel sounds present; no guarding or rebound tenderness Back: no CVA tenderness Extremities: no edema; symmetrical with no gross deformities Skin: warm and dry Neurologic: Ambulates from chair to exam table without difficulty Psychological: alert and cooperative; normal mood and affect  Labs Reviewed  URINE CULTURE - Abnormal; Notable for the following components:      Result Value   Culture   (*)    Value: >=100,000 COLONIES/mL STAPHYLOCOCCUS SAPROPHYTICUS SUSCEPTIBILITIES TO FOLLOW Performed at [facility], [address], [place], [place] [address]    All other components within normal limits  POCT URINALYSIS DIP (DEVICE) - Abnormal; Notable for the following components:   Hgb urine dipstick SMALL (*)    Leukocytes,Ua SMALL (*)    All other components within normal limits    ASSESSMENT & PLAN:  1. Dysuria   2. Urinary frequency     Meds ordered this encounter  Medications  . phenazopyridine (PYRIDIUM) 200 MG tablet    Sig: Take 1 tablet (200 mg total) by mouth 3 (three) times daily.    Dispense:  6 tablet    Refill:  0    Order Specific Question:   Supervising Provider    Answer:   Chase Picket [id number]  . nitrofurantoin, macrocrystal-monohydrate, (MACROBID) 100 MG capsule    Sig: Take 1 capsule (100 mg total) by mouth 2 (two) times daily.    Dispense:  10 capsule    Refill:  0    Order Specific Question:   Supervising Provider    Answer:   Chase Picket [[id number]]   UTI Prescribed Macrobid Prescribed  Pyridium Urine culture sent.  We will call you with abnormal results that need further treatment.   Push fluids and get plenty of rest.   Take antibiotic as directed and to completion Take pyridium as prescribed and as needed for symptomatic relief Follow up with PCP if symptoms persists Return here or go to ER if you have any new or worsening symptoms such as fever, worsening abdominal pain, nausea/vomiting, flank pain  Outlined signs and symptoms indicating need for more acute intervention. Patient verbalized understanding. After Visit Summary given.      Faustino Congress, NP 11/06/19 1751

## 2019-11-05 NOTE — Discharge Instructions (Signed)
You may have a urinary tract infection. We are going to culture your urine and will call you as soon as we have the results.   Drink plenty of water, 8-10 glasses per day.   You may take AZO over the counter for painful urination.  Follow up with your primary care provider as needed.   Go to the Emergency Department if you experience severe pain, shortness of breath, high fever, or other concerns.   

## 2019-11-06 ENCOUNTER — Encounter (HOSPITAL_COMMUNITY): Payer: Self-pay | Admitting: Family Medicine

## 2019-11-07 LAB — URINE CULTURE: Culture: >=100000 — AB

## 2019-12-19 ENCOUNTER — Encounter (HOSPITAL_COMMUNITY): Payer: Self-pay

## 2019-12-19 ENCOUNTER — Ambulatory Visit (HOSPITAL_COMMUNITY)
Admission: EM | Admit: 2019-12-19 | Discharge: 2019-12-19 | Disposition: A | Discharge status: Home / Self Care | Payer: No Typology Code available for payment source | Attending: Family Medicine | Admitting: Family Medicine

## 2019-12-19 ENCOUNTER — Other Ambulatory Visit: Payer: Self-pay

## 2019-12-19 DIAGNOSIS — L731 Pseudofolliculitis barbae: Secondary | ICD-10-CM

## 2019-12-19 MED ORDER — MUPIROCIN CALCIUM 2 % EX CREA: 1.0000 "application " | TOPICAL_CREAM | Freq: Two times a day (BID) | CUTANEOUS | 0 refills | Status: DC

## 2019-12-19 NOTE — Discharge Instructions (Signed)
Keep doing the warm compresses. Bactroban for antibiotic coverage Ibuprofen for pain as needed

## 2019-12-19 NOTE — ED Triage Notes (Signed)
Pt presents to UC for ingrown hair on right labia x1 day. Pt endorsing pain at site. Pt denies drainage. Pt states "it looks like a pimple, it is pink and white". Pt has been treating with hot compresses with out relief.

## 2019-12-19 NOTE — ED Provider Notes (Signed)
[facility]    CSN: [id number] Arrival date & time: 12/19/19  0907      History   Chief Complaint Chief Complaint  Patient presents with  . ingrown hair    HPI Denise Tanner is a 19 y.o. female.   Pt is an overall healthy 19 year old female presenting with small, painful ingrown hair follicle to right labia x 1 day.  Symptoms have been constant.  Some relief of pain from warm compresses.  Denies any drainage.  Denies any fevers, chills.  ROS per HPI      History reviewed. No pertinent past medical history.  Patient Active Problem List   Diagnosis Date Noted  . Bacterial vaginosis 09/18/2019  . 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 Behavior concern     Past Surgical History:  Procedure Laterality Date  . NO PAST SURGERIES      OB History    Gravida  0   Para  0   Term  0   Preterm  0   AB  0   Living  0     SAB  0   TAB  0   Ectopic  0   Multiple  0   Live Births               Home Medications    Prior to Admission medications   Medication Sig Start Date End Date Taking? Authorizing Provider  ipratropium (ATROVENT) 0.03 % nasal spray Place 2 sprays into both nostrils 2 (two) times daily. 07/16/19   Bing Neighbors, FNP  mupirocin cream (BACTROBAN) 2 % Apply 1 application topically 2 (two) times daily. 12/19/19   Dahlia Byes A, NP  cetirizine (ZYRTEC) 10 MG tablet Take 1 tablet (10 mg total) by mouth daily. 02/11/19 04/04/19  Georgetta Haber, NP  fluticasone (FLONASE) 50 MCG/ACT nasal spray Place 1 spray into both nostrils daily. 02/11/19 04/04/19  Georgetta Haber, NP  medroxyPROGESTERone (DEPO-PROVERA) 150 MG/ML injection Inject 150 mg into the muscle every 3 (three) months.  11/24/18  [provider]  sodium chloride (OCEAN) 0.65 % SOLN nasal spray Place 1 spray into both nostrils as needed for congestion. And to moisturize nose 02/11/19 04/04/19  Georgetta Haber, NP     Family History Family History  Problem Relation Age of Onset  . Sickle cell anemia Mother     Social History Social History   Tobacco Use  . Smoking status: Never Smoker  . Smokeless tobacco: Never Used  Vaping Use  . Vaping Use: Never used  Substance Use Topics  . Alcohol use: No  . Drug use: No     Allergies   Patient has no known allergies.   Review of Systems Review of Systems  Constitutional: Negative.   Genitourinary:       Small ingrown hair follicle to R labia     Physical Exam Triage Vital Signs ED Triage Vitals  Enc Vitals Group     BP 12/19/19 0943 118/73     Pulse Rate 12/19/19 0943 98     Resp 12/19/19 0943 16     Temp 12/19/19 0943 99.3 F (37.4 C)     Temp Source 12/19/19 0943 Oral     SpO2 12/19/19 0943 100 %     Weight --      Height --      Head Circumference --      Peak Flow --  Pain Score 12/19/19 0949 0     Pain Loc --      Pain Edu? --      Excl. in GC? --    No data found.  Updated Vital Signs BP 118/73 (BP Location: Left Arm)   Pulse 98   Temp 99.3 F (37.4 C) (Oral)   Resp 16   LMP 11/22/2019 (Exact Date)   SpO2 100%   Visual Acuity Right Eye Distance:   Left Eye Distance:   Bilateral Distance:    Right Eye Near:   Left Eye Near:    Bilateral Near:     Physical Exam Constitutional:      General: She is not in acute distress.    Appearance: Normal appearance. She is normal weight. She is not ill-appearing or toxic-appearing.  Pulmonary:     Effort: Pulmonary effort is normal.  Genitourinary:    Pubic Area: No rash.      Labia:        Right: Lesion present. No rash, tenderness or injury.        Left: No rash, tenderness, lesion or injury.        Comments: .5cm ingrown hair follicle Neurological:     General: No focal deficit present.     Mental Status: She is alert.  Psychiatric:        Mood and Affect: Mood normal.        Behavior: Behavior normal.      UC Treatments / Results   Labs (all labs ordered are listed, but only abnormal results are displayed) Labs Reviewed - No data to display  EKG   Radiology No results found.  Procedures Procedures (including critical care time)  Medications Ordered in UC Medications - No data to display  Initial Impression / Assessment and Plan / UC Course  I have reviewed the triage vital signs and the nursing notes.  Pertinent labs & imaging results that were available during my care of the patient were reviewed by me and considered in my medical decision making (see chart for details).     Ingrown hair  Most likely ingrown hair follicle. Recommend continue warm compresses and Bactroban twice a day.  Ibuprofen for pain as needed. Follow up as needed for continued or worsening symptoms  Final Clinical Impressions(s) / UC Diagnoses   Final diagnoses:  Ingrown hair     Discharge Instructions     Keep doing the warm compresses. Bactroban for antibiotic coverage Ibuprofen for pain as needed    ED Prescriptions    Medication Sig Dispense Auth. Provider   mupirocin cream (BACTROBAN) 2 % Apply 1 application topically 2 (two) times daily. 15 g Dahlia Byes A, NP     PDMP not reviewed this encounter.   Janace Aris, NP 12/19/19 1246

## 2020-01-31 ENCOUNTER — Other Ambulatory Visit: Payer: Self-pay

## 2020-01-31 ENCOUNTER — Ambulatory Visit (HOSPITAL_COMMUNITY): Payer: No Payment, Other | Admitting: Clinical

## 2020-02-07 ENCOUNTER — Other Ambulatory Visit: Payer: Self-pay

## 2020-02-07 DIAGNOSIS — Y939 Activity, unspecified: Secondary | ICD-10-CM | POA: Insufficient documentation

## 2020-02-07 DIAGNOSIS — S00411A Abrasion of right ear, initial encounter: Secondary | ICD-10-CM | POA: Diagnosis not present

## 2020-02-07 DIAGNOSIS — X58XXXA Exposure to other specified factors, initial encounter: Secondary | ICD-10-CM | POA: Diagnosis not present

## 2020-02-07 DIAGNOSIS — Y999 Unspecified external cause status: Secondary | ICD-10-CM | POA: Insufficient documentation

## 2020-02-07 DIAGNOSIS — Y9289 Other specified places as the place of occurrence of the external cause: Secondary | ICD-10-CM | POA: Insufficient documentation

## 2020-02-07 DIAGNOSIS — H9201 Otalgia, right ear: Secondary | ICD-10-CM | POA: Diagnosis present

## 2020-02-08 ENCOUNTER — Encounter (HOSPITAL_COMMUNITY): Payer: Self-pay

## 2020-02-08 ENCOUNTER — Emergency Department (HOSPITAL_COMMUNITY)
Admission: EM | Admit: 2020-02-08 | Discharge: 2020-02-08 | Disposition: A | Discharge status: Home / Self Care | Payer: Medicaid Other | Attending: Emergency Medicine | Admitting: Emergency Medicine

## 2020-02-08 DIAGNOSIS — S00411A Abrasion of right ear, initial encounter: Secondary | ICD-10-CM

## 2020-02-08 MED ORDER — CIPROFLOXACIN-DEXAMETHASONE 0.3-0.1 % OT SUSP
4.0000 [drp] | Freq: Two times a day (BID) | OTIC | Status: DC
  Administered 2020-02-08: 4 [drp] via OTIC
  Filled 2020-02-08: qty 7.5

## 2020-02-08 NOTE — ED Provider Notes (Signed)
[facility] Provider Note: Lowella Dell, MD, FACEP  CSN: [id number] MRN: [id number] ARRIVAL: 02/07/20 at 2354 ROOM: WTR5/WTR5   CHIEF COMPLAINT  Ear Pain   HISTORY OF PRESENT ILLNESS  02/08/20 12:48 AM Denise Tanner is a 19 y.o. female with right ear pain that began about an hour prior to arrival.  She rates pain is a 4 out of 10 and describes it as feeling like something is in it.  Pain is worse with movement of the right external ear.  She tried digging her ear and has had some bleeding from her right ear.    History reviewed. No pertinent past medical history.  Past Surgical History:  Procedure Laterality Date  . NO PAST SURGERIES      Family History  Problem Relation Age of Onset  . Sickle cell anemia Mother     Social History   Tobacco Use  . Smoking status: Never Smoker  . Smokeless tobacco: Never Used  Vaping Use  . Vaping Use: Never used  Substance Use Topics  . Alcohol use: No  . Drug use: No    Prior to Admission medications   Medication Sig Start Date End Date Taking? Authorizing Provider  ipratropium (ATROVENT) 0.03 % nasal spray Place 2 sprays into both nostrils 2 (two) times daily. 07/16/19   Bing Neighbors, FNP  mupirocin cream (BACTROBAN) 2 % Apply 1 application topically 2 (two) times daily. 12/19/19   Dahlia Byes A, NP  cetirizine (ZYRTEC) 10 MG tablet Take 1 tablet (10 mg total) by mouth daily. 02/11/19 04/04/19  Georgetta Haber, NP  fluticasone (FLONASE) 50 MCG/ACT nasal spray Place 1 spray into both nostrils daily. 02/11/19 04/04/19  Georgetta Haber, NP  medroxyPROGESTERone (DEPO-PROVERA) 150 MG/ML injection Inject 150 mg into the muscle every 3 (three) months.  11/24/18  [provider]  sodium chloride (OCEAN) 0.65 % SOLN nasal spray Place 1 spray into both nostrils as needed for congestion. And to moisturize nose 02/11/19 04/04/19  Georgetta Haber, NP    Allergies Patient has no known allergies.   REVIEW OF SYSTEMS    Negative except as noted here or in the History of Present Illness.   PHYSICAL EXAMINATION  Initial Vital Signs Blood pressure 123/76, pulse 88, temperature 98.2 F (36.8 C), temperature source Oral, resp. rate 16, last menstrual period 01/08/2020, SpO2 100 %.  Examination General: Well-developed, well-nourished female in no acute distress; appearance consistent with age of record HENT: normocephalic; atraumatic; TMs normal; left external auditory canals normal; right external auditory canal with small abrasion with overlying blood but no active bleeding, no foreign body seen Eyes: Normal appearance Neck: supple Heart: regular rate and rhythm Lungs: clear to auscultation bilaterally Abdomen: soft; nondistended; nontender; bowel sounds present Extremities: No deformity; full range of motion Neurologic: Awake, alert and oriented; motor function intact in all extremities and symmetric; no facial droop Skin: Warm and dry Psychiatric: Normal mood and affect   RESULTS  Summary of this visit's results, reviewed and interpreted by myself:   EKG Interpretation  Date/Time:    Ventricular Rate:    PR Interval:    QRS Duration:   QT Interval:    QTC Calculation:   R Axis:     Text Interpretation:        Laboratory Studies: No results found for this or any previous visit (from the past 24 hour(s)). Imaging Studies: No results found.  ED COURSE and MDM  Nursing notes, initial and subsequent  vitals signs, including pulse oximetry, reviewed and interpreted by myself.  Vitals:   02/08/20 0012  BP: 123/76  Pulse: 88  Resp: 16  Temp: 98.2 F (36.8 C)  TempSrc: Oral  SpO2: 100%   Medications  ciprofloxacin-dexamethasone (CIPRODEX) 0.3-0.1 % OTIC (EAR) suspension 4 drop (has no administration in time range)    No foreign body seen.  She may have had a foreign body, such as an insect, that she removed or that left under its own power.  We will treat her with a short course of  antibiotic eardrops.  PROCEDURES  Procedures   ED DIAGNOSES     ICD-10-CM   1. Abrasion of right ear canal, initial encounter  S00.411A        Paula Libra, MD 02/08/20 [phone]

## 2020-02-08 NOTE — ED Triage Notes (Signed)
Pt complains of right ear pain for about an hour, she thinks something got in it, she tried to dig in there and so there is a little blood in her ear canal

## 2020-03-18 ENCOUNTER — Ambulatory Visit: Payer: Medicaid Other | Admitting: Advanced Practice Midwife

## 2020-03-25 ENCOUNTER — Other Ambulatory Visit: Payer: Self-pay

## 2020-03-25 ENCOUNTER — Encounter: Payer: Self-pay | Admitting: Advanced Practice Midwife

## 2020-03-25 ENCOUNTER — Ambulatory Visit (INDEPENDENT_AMBULATORY_CARE_PROVIDER_SITE_OTHER): Payer: Medicaid Other | Admitting: Advanced Practice Midwife

## 2020-03-25 VITALS — BP 118/77 | HR 80 | Wt 156.0 lb

## 2020-03-25 DIAGNOSIS — K529 Noninfective gastroenteritis and colitis, unspecified: Secondary | ICD-10-CM | POA: Diagnosis not present

## 2020-03-25 DIAGNOSIS — Z3009 Encounter for other general counseling and advice on contraception: Secondary | ICD-10-CM | POA: Diagnosis not present

## 2020-03-25 DIAGNOSIS — R112 Nausea with vomiting, unspecified: Secondary | ICD-10-CM

## 2020-03-25 DIAGNOSIS — R102 Pelvic and perineal pain: Secondary | ICD-10-CM | POA: Diagnosis not present

## 2020-03-25 LAB — POCT URINALYSIS DIPSTICK
Bilirubin, UA: NEGATIVE
Blood, UA: NEGATIVE
Glucose, UA: NEGATIVE
Ketones, UA: NEGATIVE
Leukocytes, UA: NEGATIVE
Nitrite, UA: NEGATIVE
Protein, UA: NEGATIVE
Spec Grav, UA: 1.015 (ref 1.010–1.025)
Urobilinogen, UA: 0.2 E.U./dL
pH, UA: 7 (ref 5.0–8.0)

## 2020-03-25 LAB — POCT URINE PREGNANCY: Preg Test, Ur: NEGATIVE

## 2020-03-25 NOTE — Patient Instructions (Signed)
Pelvic Pain, Female Pelvic pain is pain in your lower abdomen, below your belly button and between your hips. The pain may start suddenly (be acute), keep coming back (be recurring), or last a long time (become chronic). Pelvic pain that lasts longer than 6 months is considered chronic. Pelvic pain may affect your:  Reproductive organs.  Urinary system.  Digestive tract.  Musculoskeletal system. There are many potential causes of pelvic pain. Sometimes, the pain can be a result of digestive or urinary conditions, strained muscles or ligaments, or reproductive conditions. Sometimes the cause of pelvic pain is not known. Follow these instructions at home:   Take over-the-counter and prescription medicines only as told by your health care provider.  Rest as told by your health care provider.  Do not have sex if it hurts.  Keep a journal of your pelvic pain. Write down: ? When the pain started. ? Where the pain is located. ? What seems to make the pain better or worse, such as food or your period (menstrual cycle). ? Any symptoms you have along with the pain.  Keep all follow-up visits as told by your health care provider. This is important. Contact a health care provider if:  Medicine does not help your pain.  Your pain comes back.  You have new symptoms.  You have abnormal vaginal discharge or bleeding, including bleeding after menopause.  You have a fever or chills.  You are constipated.  You have blood in your urine or stool.  You have foul-smelling urine.  You feel weak or light-headed. Get help right away if:  You have sudden severe pain.  Your pain gets steadily worse.  You have severe pain along with fever, nausea, vomiting, or excessive sweating.  You lose consciousness. Summary  Pelvic pain is pain in your lower abdomen, below your belly button and between your hips.  There are many potential causes of pelvic pain.  Keep a journal of your pelvic  pain. This information is not intended to replace advice given to you by your health care provider. Make sure you discuss any questions you have with your health care provider. Document Revised: 11/10/2017 Document Reviewed: 11/10/2017 Elsevier Patient Education  2020 [employer]  

## 2020-03-25 NOTE — Progress Notes (Signed)
  GYNECOLOGY PROGRESS NOTE  History:  19 y.o. G0P0000 presents to [facility] office today for problem gyn visit. She reports abdominal/pelvic pain last week associated with n/v.  The symptoms have since resolved but she kept the appt. She is sexually active, but not in the last 5-6 months and is not using any contraception.  She denies h/a, dizziness, shortness of breath, n/v, or fever/chills.    The following portions of the patient's history were reviewed and updated as appropriate: allergies, current medications, past family history, past medical history, past social history, past surgical history and problem list.  Review of Systems:  Pertinent items are noted in HPI.   Objective:  Physical Exam There were no vitals taken for this visit. VS reviewed, nursing note reviewed,  Constitutional: well developed, well nourished, no distress HEENT: normocephalic CV: normal rate Pulm/chest wall: normal effort Breast Exam: deferred Abdomen: soft Neuro: alert and oriented x 3 Skin: warm, dry Psych: affect normal Pelvic exam: Deferred  Assessment & Plan:  1. Pelvic pain in female --Pain was temporary, mostly abdominal, occurred 1 week ago and was associated with n/v. --Offered STD testing, pt declined, not sexually active x 5-6 months. --UPT negative, urine dip negative - POCT Urinalysis Dipstick  2. Nausea and vomiting, intractability of vomiting not specified, unspecified vomiting type - POCT urine pregnancy  3. Gastroenteritis --Likely viral or food bourne gastroenteritis. All symptoms resolved before today's visit.   4. Encounter for counseling regarding contraception --Discussed LARCs as most effective forms of birth control.  Discussed benefits/risks of other methods.   --Pt is concerned about long term fertility. Reviewed safety of contraceptive options, no evidence of harm on long term fertility.  --Pt desires to use natural family planning right now.  She has regular menses.   Encouraged use of apps on phone to track periods and fertile days.  Call office for follow up if more is desired.    Sharen Counter, CNM 8:33 AM

## 2020-03-25 NOTE — Progress Notes (Signed)
Pt is here with c/o "stomach pain" and vomiting. Pt reports these symptoms started last week and have since resolved for the most part. Pt denies fever or any other symptoms.   Pt is currently sexually active, no contraception. LMP 03/11/20. UPT negative today.

## 2020-04-01 ENCOUNTER — Other Ambulatory Visit: Payer: Self-pay

## 2020-04-01 ENCOUNTER — Telehealth (HOSPITAL_COMMUNITY): Payer: Self-pay | Admitting: Clinical

## 2020-04-01 ENCOUNTER — Ambulatory Visit (HOSPITAL_COMMUNITY): Payer: No Payment, Other | Admitting: Clinical

## 2020-04-01 NOTE — Telephone Encounter (Signed)
Therapist sent the client a link for the scheduled visit via MyChart. Client did not check in for the session using the link. Therapist attempted to call the client using the cell phone number on file. Therapist was unable to leave a voice mail due to the mail box not being set up.

## 2020-07-05 ENCOUNTER — Ambulatory Visit (HOSPITAL_COMMUNITY)
Admission: EM | Admit: 2020-07-05 | Discharge: 2020-07-05 | Disposition: A | Discharge status: Home / Self Care | Payer: Medicaid Other | Attending: Student | Admitting: Student

## 2020-07-05 ENCOUNTER — Other Ambulatory Visit: Payer: Self-pay

## 2020-07-05 ENCOUNTER — Encounter (HOSPITAL_COMMUNITY): Payer: Self-pay

## 2020-07-05 DIAGNOSIS — Z3202 Encounter for pregnancy test, result negative: Secondary | ICD-10-CM | POA: Diagnosis not present

## 2020-07-05 DIAGNOSIS — R112 Nausea with vomiting, unspecified: Secondary | ICD-10-CM | POA: Diagnosis not present

## 2020-07-05 DIAGNOSIS — R0789 Other chest pain: Secondary | ICD-10-CM

## 2020-07-05 DIAGNOSIS — R002 Palpitations: Secondary | ICD-10-CM

## 2020-07-05 LAB — POC URINE PREG, ED: Preg Test, Ur: NEGATIVE

## 2020-07-05 MED ORDER — ONDANSETRON HCL 8 MG PO TABS: 8.0000 mg | ORAL_TABLET | Freq: Three times a day (TID) | ORAL | 0 refills | Status: DC | PRN

## 2020-07-05 MED ORDER — TIZANIDINE HCL 2 MG PO CAPS: 2.0000 mg | ORAL_CAPSULE | Freq: Three times a day (TID) | ORAL | 0 refills | Status: DC

## 2020-07-05 NOTE — ED Provider Notes (Signed)
[facility]    CSN: [id number] Arrival date & time: 07/05/20  1715      History   Chief Complaint Chief Complaint  Patient presents with  . Possible Pregnancy    HPI Denise Tanner is a 20 y.o. female presenting for possible pregnancy and chest pain.  -Endorses one week of nausea with one episode of vomiting this morning. Has been having unprotected intercourse. Also notes breast soreness. States she did have her period this month though it was shorter than normal. States normal bowel movements, last one was this morning, and is still passing gas. Generalized crampy abd pain. Still has appendix and gallbladder. Not taking birth control. Denies urinary symptoms, denies vaginal discharge.  -Endorses left-sided chest pain at rest x1 day. Feels like chest pressure. Endorses some left arm pain with movement and left-sided upper back pain with movement.   HPI  History reviewed. No pertinent past medical history.  Patient Active Problem List   Diagnosis Date Noted  . Bacterial vaginosis 09/18/2019  . 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 Behavior concern     Past Surgical History:  Procedure Laterality Date  . NO PAST SURGERIES      OB History    Gravida  0   Para  0   Term  0   Preterm  0   AB  0   Living  0     SAB  0   IAB  0   Ectopic  0   Multiple  0   Live Births               Home Medications    Prior to Admission medications   Medication Sig Start Date End Date Taking? Authorizing Provider  ondansetron (ZOFRAN) 8 MG tablet Take 1 tablet (8 mg total) by mouth every 8 (eight) hours as needed for nausea or vomiting. 07/05/20  Yes Rhys Martini, PA-C  tizanidine (ZANAFLEX) 2 MG capsule Take 1 capsule (2 mg total) by mouth 3 (three) times daily. 07/05/20  Yes Rhys Martini, PA-C  ipratropium (ATROVENT) 0.03 % nasal spray Place 2 sprays into both nostrils 2 (two) times daily. Patient not  taking: Reported on 03/25/2020 07/16/19   Bing Neighbors, FNP  mupirocin cream (BACTROBAN) 2 % Apply 1 application topically 2 (two) times daily. Patient not taking: Reported on 03/25/2020 12/19/19   Dahlia Byes A, NP  cetirizine (ZYRTEC) 10 MG tablet Take 1 tablet (10 mg total) by mouth daily. 02/11/19 04/04/19  Georgetta Haber, NP  fluticasone (FLONASE) 50 MCG/ACT nasal spray Place 1 spray into both nostrils daily. 02/11/19 04/04/19  Georgetta Haber, NP  medroxyPROGESTERone (DEPO-PROVERA) 150 MG/ML injection Inject 150 mg into the muscle every 3 (three) months.  11/24/18  [provider]  sodium chloride (OCEAN) 0.65 % SOLN nasal spray Place 1 spray into both nostrils as needed for congestion. And to moisturize nose 02/11/19 04/04/19  Georgetta Haber, NP    Family History Family History  Problem Relation Age of Onset  . Sickle cell anemia Mother     Social History Social History   Tobacco Use  . Smoking status: Never Smoker  . Smokeless tobacco: Never Used  Vaping Use  . Vaping Use: Never used  Substance Use Topics  . Alcohol use: No  . Drug use: No     Allergies   Patient has no known allergies.   Review of Systems Review of  Systems  Constitutional: Negative for appetite change, chills, diaphoresis and fever.  Respiratory: Negative for shortness of breath.   Cardiovascular: Negative for chest pain.  Gastrointestinal: Positive for nausea and vomiting. Negative for abdominal pain, blood in stool, constipation and diarrhea.  Genitourinary: Negative for decreased urine volume, difficulty urinating, dysuria, flank pain, frequency, genital sores, hematuria, menstrual problem, pelvic pain, urgency, vaginal bleeding, vaginal discharge and vaginal pain.  Musculoskeletal: Negative for back pain.       Left-sided chest and back pain, worse with movement  Neurological: Negative for dizziness, weakness and light-headedness.  All other systems reviewed and are  negative.    Physical Exam Triage Vital Signs ED Triage Vitals  Enc Vitals Group     BP 07/05/20 1740 (!) 141/86     Pulse Rate 07/05/20 1740 (!) 108     Resp 07/05/20 1740 16     Temp 07/05/20 1740 98.9 F (37.2 C)     Temp Source 07/05/20 1740 Oral     SpO2 07/05/20 1740 99 %     Weight --      Height --      Head Circumference --      Peak Flow --      Pain Score 07/05/20 1742 0     Pain Loc --      Pain Edu? --      Excl. in GC? --    No data found.  Updated Vital Signs BP (!) 141/86 (BP Location: Right Arm)   Pulse (!) 108   Temp 98.9 F (37.2 C) (Oral)   Resp 16   SpO2 99%   Visual Acuity Right Eye Distance:   Left Eye Distance:   Bilateral Distance:    Right Eye Near:   Left Eye Near:    Bilateral Near:     Physical Exam Vitals reviewed.  Constitutional:      General: She is not in acute distress.    Appearance: Normal appearance. She is not ill-appearing.  HENT:     Head: Normocephalic and atraumatic.  Cardiovascular:     Rate and Rhythm: Normal rate and regular rhythm.     Heart sounds: Normal heart sounds.  Pulmonary:     Effort: Pulmonary effort is normal.     Breath sounds: Normal breath sounds.  Chest:     Comments: Tenderness to palpation left anterolateral ribs.  Abdominal:     General: Bowel sounds are normal. There is no distension.     Palpations: Abdomen is soft. There is no mass.     Tenderness: There is generalized abdominal tenderness and tenderness in the left upper quadrant. There is no right CVA tenderness, left CVA tenderness, guarding or rebound. Negative signs include Murphy's sign, Rovsing's sign and McBurney's sign.     Comments: Generalized tenderness to palpation, worse in LLQ   Musculoskeletal:     Comments: Tenderness to palpation left anterolateral ribs, no bony deformity. L shoulder tenderness with resisted abduction. No shoulder point tenderness.   Neurological:     General: No focal deficit present.     Mental  Status: She is alert and oriented to person, place, and time. Mental status is at baseline.  Psychiatric:        Mood and Affect: Mood normal.        Behavior: Behavior normal.        Thought Content: Thought content normal.        Judgment: Judgment normal.      UC  Treatments / Results  Labs (all labs ordered are listed, but only abnormal results are displayed) Labs Reviewed  POC URINE PREG, ED    EKG   Radiology No results found.  Procedures Procedures (including critical care time)  Medications Ordered in UC Medications - No data to display  Initial Impression / Assessment and Plan / UC Course  I have reviewed the triage vital signs and the nursing notes.  Pertinent labs & imaging results that were available during my care of the patient were reviewed by me and considered in my medical decision making (see chart for details).     Zanaflex and ibuprofen for discomfort.  Zofran for nausea/vomiting.  Negative urine pregnancy. Rec condom or birth control for prevention of pregnancy.   Pt presenting with occ palpitations and left-sided chest/shoulder pain that is worsened with movement. EKG today NSR without ST elevations. Normal compared with 2016 EKG. Pt denies history of cardiopulmonary disease though 2016 EKG shows possible PVCs. Follow-up with PCP or cardio if symptoms worsen/persist.   Return precautions - new/worsening abd pain, new/worsening fevers, worsening left-sided chest pain at rest, etc.   Spent over 40 minutes obtaining H&P, performing physical, interpreting labs and EKG, discussing results, treatment plan and plan for follow-up with patient. Patient agrees with plan.    Final Clinical Impressions(s) / UC Diagnoses   Final diagnoses:  Negative pregnancy test  Non-intractable vomiting with nausea, unspecified vomiting type  Palpitations  Atypical chest pain     Discharge Instructions     -Your pregnancy test was negative today.  -Zofran as  needed for nausea/vomiting, up to 3x daily. Try to drink plenty of fluids and eat a bland diet as tolerated. -Your EKG looked normal today. If you continue experiencing sensation of heart racing, please follow-up with PRP or cardiology (information below). -If you experience left-sided chest pain or pressure at rest, come back and see [place], or head to ED.     ED Prescriptions    Medication Sig Dispense Auth. Provider   ondansetron (ZOFRAN) 8 MG tablet Take 1 tablet (8 mg total) by mouth every 8 (eight) hours as needed for nausea or vomiting. 20 tablet Rhys Martini, PA-C   tizanidine (ZANAFLEX) 2 MG capsule Take 1 capsule (2 mg total) by mouth 3 (three) times daily. 21 capsule Rhys Martini, PA-C     PDMP not reviewed this encounter.   Rhys Martini, PA-C 07/05/20 [place]

## 2020-07-05 NOTE — Discharge Instructions (Addendum)
-  Your pregnancy test was negative today.  -Zofran as needed for nausea/vomiting, up to 3x daily. Try to drink plenty of fluids and eat a bland diet as tolerated. -Your EKG looked normal today. If you continue experiencing sensation of heart racing, please follow-up with PRP or cardiology (information below). -If you experience left-sided chest pain or pressure at rest, come back and see [place], or head to ED.

## 2020-07-05 NOTE — ED Triage Notes (Signed)
Pt present possible pregnancy. Pt states that she has been having nausea and soreness of the breast. Pt state she did have a period for this month but it didn't stay on long and she has been having unprotected sex.

## 2020-10-21 ENCOUNTER — Ambulatory Visit (HOSPITAL_COMMUNITY)
Admission: EM | Admit: 2020-10-21 | Discharge: 2020-10-21 | Disposition: A | Discharge status: Home / Self Care | Payer: Medicaid Other | Attending: Physician Assistant | Admitting: Physician Assistant

## 2020-10-21 ENCOUNTER — Encounter (HOSPITAL_COMMUNITY): Payer: Self-pay

## 2020-10-21 ENCOUNTER — Other Ambulatory Visit: Payer: Self-pay

## 2020-10-21 DIAGNOSIS — R350 Frequency of micturition: Secondary | ICD-10-CM | POA: Diagnosis present

## 2020-10-21 DIAGNOSIS — R8290 Unspecified abnormal findings in urine: Secondary | ICD-10-CM | POA: Insufficient documentation

## 2020-10-21 DIAGNOSIS — Z3201 Encounter for pregnancy test, result positive: Secondary | ICD-10-CM | POA: Insufficient documentation

## 2020-10-21 LAB — POCT URINALYSIS DIPSTICK, ED / UC
Bilirubin Urine: NEGATIVE
Glucose, UA: NEGATIVE mg/dL
Ketones, ur: NEGATIVE mg/dL
Nitrite: NEGATIVE
Protein, ur: NEGATIVE mg/dL
Specific Gravity, Urine: 1.015 (ref 1.005–1.030)
Urobilinogen, UA: 0.2 mg/dL (ref 0.0–1.0)
pH: 7 (ref 5.0–8.0)

## 2020-10-21 LAB — POC URINE PREG, ED: Preg Test, Ur: POSITIVE — AB

## 2020-10-21 MED ORDER — AMOXICILLIN-POT CLAVULANATE 500-125 MG PO TABS: 1.0000 | ORAL_TABLET | Freq: Two times a day (BID) | ORAL | 0 refills | Status: DC

## 2020-10-21 NOTE — ED Provider Notes (Signed)
[facility]    CSN: [id number] Arrival date & time: 10/21/20  5366      History   Chief Complaint Chief Complaint  Patient presents with  . Back Pain  . Abdominal Pain  . Urinary Frequency    HPI Denise Tanner is a 20 y.o. female.   Patient presents today with a several day history of urinary frequency.  She also reports that her menstrual cycle was late as she uses an app to track this to get home pregnancy test that was positive.  She is hoping we can confirm this result today.  Reports LMP 09/18/2020.  Typically has very regular menstrual cycles.  She is not on any contraception.  Denies regular over-the-counter or prescription medication use.  She does not smoke, drink, use illicit drugs.  This is her first pregnancy.  She does report some urinary frequency positioning testing for UTI.  Reports breast tenderness, decreased appetite, lower back pain.  Denies any nausea/vomiting, fever, hematuria.  Denies any recent antibiotic use.  Denies history of recurrent UTI.     History reviewed. No pertinent past medical history.  Patient Active Problem List   Diagnosis Date Noted  . Bacterial vaginosis 09/18/2019  . 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 Behavior concern     Past Surgical History:  Procedure Laterality Date  . NO PAST SURGERIES      OB History    Gravida  0   Para  0   Term  0   Preterm  0   AB  0   Living  0     SAB  0   IAB  0   Ectopic  0   Multiple  0   Live Births               Home Medications    Prior to Admission medications   Medication Sig Start Date End Date Taking? Authorizing Provider  amoxicillin-clavulanate (AUGMENTIN) 500-125 MG tablet Take 1 tablet (500 mg total) by mouth in the morning and at bedtime. 10/21/20  Yes Aspasia Rude K, PA-C  cetirizine (ZYRTEC) 10 MG tablet Take 1 tablet (10 mg total) by mouth daily. 02/11/19 04/04/19  Georgetta Haber, NP   fluticasone (FLONASE) 50 MCG/ACT nasal spray Place 1 spray into both nostrils daily. 02/11/19 04/04/19  Linus Mako B, NP  ipratropium (ATROVENT) 0.03 % nasal spray Place 2 sprays into both nostrils 2 (two) times daily. Patient not taking: Reported on 03/25/2020 07/16/19 10/21/20  Bing Neighbors, FNP  medroxyPROGESTERone (DEPO-PROVERA) 150 MG/ML injection Inject 150 mg into the muscle every 3 (three) months.  11/24/18  [provider]  sodium chloride (OCEAN) 0.65 % SOLN nasal spray Place 1 spray into both nostrils as needed for congestion. And to moisturize nose 02/11/19 04/04/19  Georgetta Haber, NP    Family History Family History  Problem Relation Age of Onset  . Sickle cell anemia Mother     Social History Social History   Tobacco Use  . Smoking status: Never Smoker  . Smokeless tobacco: Never Used  Vaping Use  . Vaping Use: Never used  Substance Use Topics  . Alcohol use: No  . Drug use: No     Allergies   Patient has no known allergies.   Review of Systems Review of Systems  Constitutional: Positive for appetite change. Negative for activity change, fatigue and fever.  Respiratory: Negative for cough and shortness of breath.  Cardiovascular: Negative for chest pain.  Gastrointestinal: Negative for abdominal pain, diarrhea, nausea and vomiting.  Genitourinary: Positive for frequency and menstrual problem. Negative for dysuria, urgency, vaginal bleeding, vaginal discharge and vaginal pain.     Physical Exam Triage Vital Signs ED Triage Vitals  Enc Vitals Group     BP 10/21/20 1101 120/74     Pulse Rate 10/21/20 1101 (!) 108     Resp 10/21/20 1101 18     Temp 10/21/20 1101 99 F (37.2 C)     Temp Source 10/21/20 1101 Oral     SpO2 10/21/20 1101 100 %     Weight --      Height --      Head Circumference --      Peak Flow --      Pain Score 10/21/20 1100 0     Pain Loc --      Pain Edu? --      Excl. in GC? --    No data found.  Updated  Vital Signs BP 120/74 (BP Location: Right Arm)   Pulse (!) 108   Temp 99 F (37.2 C) (Oral)   Resp 18   LMP 09/18/2020 (Exact Date)   SpO2 100%   Visual Acuity Right Eye Distance:   Left Eye Distance:   Bilateral Distance:    Right Eye Near:   Left Eye Near:    Bilateral Near:     Physical Exam Vitals reviewed.  Constitutional:      General: She is awake. She is not in acute distress.    Appearance: Normal appearance. She is not ill-appearing.     Comments: Very pleasant female appears stated age in no acute distress  HENT:     Head: Normocephalic and atraumatic.  Cardiovascular:     Rate and Rhythm: Normal rate and regular rhythm.     Heart sounds: No murmur heard.   Pulmonary:     Effort: Pulmonary effort is normal.     Breath sounds: Normal breath sounds. No wheezing, rhonchi or rales.     Comments: Clear to auscultation bilaterally Abdominal:     General: Bowel sounds are normal.     Palpations: Abdomen is soft.     Tenderness: There is no abdominal tenderness. There is no right CVA tenderness, left CVA tenderness, guarding or rebound.     Comments: Benign abdominal exam  Musculoskeletal:     Cervical back: No tenderness or bony tenderness.     Thoracic back: No tenderness or bony tenderness.     Lumbar back: No tenderness or bony tenderness.  Psychiatric:        Behavior: Behavior is cooperative.      UC Treatments / Results  Labs (all labs ordered are listed, but only abnormal results are displayed) Labs Reviewed  POCT URINALYSIS DIPSTICK, ED / UC - Abnormal; Notable for the following components:      Result Value   Hgb urine dipstick TRACE (*)    Leukocytes,Ua TRACE (*)    All other components within normal limits  POC URINE PREG, ED - Abnormal; Notable for the following components:   Preg Test, Ur POSITIVE (*)    All other components within normal limits  URINE CULTURE    EKG   Radiology No results found.  Procedures Procedures  (including critical care time)  Medications Ordered in UC Medications - No data to display  Initial Impression / Assessment and Plan / UC Course  I have reviewed the  triage vital signs and the nursing notes.  Pertinent labs & imaging results that were available during my care of the patient were reviewed by me and considered in my medical decision making (see chart for details).     Urine pregnancy test was positive in office today.  Patient was given contact information for OB/GYN and encouraged to call them to schedule an appointment.  UA did show trace leukocytes and hemoglobin so will empirically treat for UTI with Augmentin.  Urine culture obtained today-results pending.  Discussed potential need to change antibiotics based on susceptibilities identified on culture.  Discussed that she should avoid saunas and hot tubs or anything that raises core body temperature.  She is to start a prenatal vitamin.  Recommended she avoid caffeine and alcohol.  She is to make sure all food is thoroughly cooked.  Discussed that if she has any persistent or worsening symptoms she needs to be reevaluated.  Strict return precautions given to which patient expressed understanding.  Final Clinical Impressions(s) / UC Diagnoses   Final diagnoses:  Positive pregnancy test  Abnormal urinalysis  Urinary frequency     Discharge Instructions     Your pregnancy test was positive.  Need to start a prenatal vitamin.  As we discussed, avoid caffeine, alcohol, drugs, over-the-counter medications.  You can take Tylenol for headache if necessary.  Please follow-up with OB/GYN ASAP.  If you have any vaginal bleeding or cramping you need to go to the emergency room.  Your urine did look like you might have a urinary tract infection so we are going to start an antibiotic.  We will send this off for culture and if need to change antibiotic we will contact you.  If you have any worsening symptoms please return for  reevaluation.    ED Prescriptions    Medication Sig Dispense Auth. Provider   amoxicillin-clavulanate (AUGMENTIN) 500-125 MG tablet Take 1 tablet (500 mg total) by mouth in the morning and at bedtime. 14 tablet Adreanna Fickel, Noberto Retort, PA-C     PDMP not reviewed this encounter.   Jeani Hawking, PA-C 10/21/20 1131

## 2020-10-21 NOTE — ED Triage Notes (Signed)
Pt c/o frequent urination, late menstrual cycle, back pain and abdominal cramping x 1 week.

## 2020-10-21 NOTE — Discharge Instructions (Signed)
Your pregnancy test was positive.  Need to start a prenatal vitamin.  As we discussed, avoid caffeine, alcohol, drugs, over-the-counter medications.  You can take Tylenol for headache if necessary.  Please follow-up with OB/GYN ASAP.  If you have any vaginal bleeding or cramping you need to go to the emergency room.  Your urine did look like you might have a urinary tract infection so we are going to start an antibiotic.  We will send this off for culture and if need to change antibiotic we will contact you.  If you have any worsening symptoms please return for reevaluation.

## 2020-10-22 ENCOUNTER — Other Ambulatory Visit: Payer: Self-pay

## 2020-10-22 ENCOUNTER — Ambulatory Visit (HOSPITAL_COMMUNITY)
Admission: EM | Admit: 2020-10-22 | Discharge: 2020-10-22 | Disposition: A | Discharge status: Home / Self Care | Payer: Medicaid Other | Attending: Emergency Medicine | Admitting: Emergency Medicine

## 2020-10-22 ENCOUNTER — Encounter (HOSPITAL_COMMUNITY): Payer: Self-pay

## 2020-10-22 DIAGNOSIS — B373 Candidiasis of vulva and vagina: Secondary | ICD-10-CM

## 2020-10-22 DIAGNOSIS — B3731 Acute candidiasis of vulva and vagina: Secondary | ICD-10-CM

## 2020-10-22 LAB — URINE CULTURE

## 2020-10-22 MED ORDER — TERCONAZOLE 0.4 % VA CREA: 1.0000 | TOPICAL_CREAM | Freq: Every day | VAGINAL | 0 refills | Status: DC

## 2020-10-22 NOTE — ED Provider Notes (Signed)
[facility]    CSN: [id number] Arrival date & time: 10/22/20  1844      History   Chief Complaint Chief Complaint  Patient presents with  . Vaginal Discharge    HPI Denise Tanner is a 20 y.o. female.   Patient here for evaluation of vaginal discharge that started this morning.  Reports being evaluated yesterday for UTI and placed on Augmentin.  Reports having significant vaginal irritation and a thick white discharge that started this morning.  Has not taken any OTC medications or treatments.  Denies any trauma, injury, or other precipitating event.  Denies any specific alleviating or aggravating factors.  Denies any fevers, chest pain, shortness of breath, N/V/D, numbness, tingling, weakness, abdominal pain, or headaches.   ROS: As per HPI, all other pertinent ROS negative   The history is provided by the patient.  Vaginal Discharge   History reviewed. No pertinent past medical history.  Patient Active Problem List   Diagnosis Date Noted  . Bacterial vaginosis 09/18/2019  . 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 Behavior concern     Past Surgical History:  Procedure Laterality Date  . NO PAST SURGERIES      OB History    Gravida  1   Para  0   Term  0   Preterm  0   AB  0   Living  0     SAB  0   IAB  0   Ectopic  0   Multiple  0   Live Births               Home Medications    Prior to Admission medications   Medication Sig Start Date End Date Taking? Authorizing Provider  terconazole (TERAZOL 7) 0.4 % vaginal cream Place 1 applicator vaginally at bedtime. 10/22/20  Yes Ivette Loyal, NP  amoxicillin-clavulanate (AUGMENTIN) 500-125 MG tablet Take 1 tablet (500 mg total) by mouth in the morning and at bedtime. 10/21/20   Raspet, Noberto Retort, PA-C  cetirizine (ZYRTEC) 10 MG tablet Take 1 tablet (10 mg total) by mouth daily. 02/11/19 04/04/19  Georgetta Haber, NP  fluticasone (FLONASE) 50  MCG/ACT nasal spray Place 1 spray into both nostrils daily. 02/11/19 04/04/19  Linus Mako B, NP  ipratropium (ATROVENT) 0.03 % nasal spray Place 2 sprays into both nostrils 2 (two) times daily. Patient not taking: Reported on 03/25/2020 07/16/19 10/21/20  Bing Neighbors, FNP  medroxyPROGESTERone (DEPO-PROVERA) 150 MG/ML injection Inject 150 mg into the muscle every 3 (three) months.  11/24/18  [provider]  sodium chloride (OCEAN) 0.65 % SOLN nasal spray Place 1 spray into both nostrils as needed for congestion. And to moisturize nose 02/11/19 04/04/19  Georgetta Haber, NP    Family History Family History  Problem Relation Age of Onset  . Sickle cell anemia Mother     Social History Social History   Tobacco Use  . Smoking status: Never Smoker  . Smokeless tobacco: Never Used  Vaping Use  . Vaping Use: Never used  Substance Use Topics  . Alcohol use: No  . Drug use: No     Allergies   Patient has no known allergies.   Review of Systems Review of Systems  Genitourinary: Positive for vaginal discharge.  All other systems reviewed and are negative.    Physical Exam Triage Vital Signs ED Triage Vitals  Enc Vitals Group     BP  10/22/20 1933 110/73     Pulse Rate 10/22/20 1933 75     Resp 10/22/20 1933 18     Temp 10/22/20 1933 98.6 F (37 C)     Temp Source 10/22/20 1933 Oral     SpO2 10/22/20 1933 98 %     Weight --      Height --      Head Circumference --      Peak Flow --      Pain Score 10/22/20 1932 10     Pain Loc --      Pain Edu? --      Excl. in GC? --    No data found.  Updated Vital Signs BP 110/73 (BP Location: Right Arm)   Pulse 75   Temp 98.6 F (37 C) (Oral)   Resp 18   LMP  (Within Months) Comment: 1 month  SpO2 98%   Visual Acuity Right Eye Distance:   Left Eye Distance:   Bilateral Distance:    Right Eye Near:   Left Eye Near:    Bilateral Near:     Physical Exam Vitals and nursing note reviewed.   Constitutional:      General: She is not in acute distress.    Appearance: Normal appearance. She is not ill-appearing, toxic-appearing or diaphoretic.  HENT:     Head: Normocephalic and atraumatic.  Eyes:     Conjunctiva/sclera: Conjunctivae normal.  Cardiovascular:     Rate and Rhythm: Normal rate.     Pulses: Normal pulses.  Pulmonary:     Effort: Pulmonary effort is normal.  Abdominal:     General: Abdomen is flat.  Genitourinary:    Comments: declines Musculoskeletal:        General: Normal range of motion.     Cervical back: Normal range of motion.  Skin:    General: Skin is warm and dry.  Neurological:     General: No focal deficit present.     Mental Status: She is alert and oriented to person, place, and time.  Psychiatric:        Mood and Affect: Mood normal.      UC Treatments / Results  Labs (all labs ordered are listed, but only abnormal results are displayed) Labs Reviewed - No data to display  EKG   Radiology No results found.  Procedures Procedures (including critical care time)  Medications Ordered in UC Medications - No data to display  Initial Impression / Assessment and Plan / UC Course  I have reviewed the triage vital signs and the nursing notes.  Pertinent labs & imaging results that were available during my care of the patient were reviewed by me and considered in my medical decision making (see chart for details).    Assessment negative for red flags or concerns.  Likely yeast infection will treat with terconazole.  Continue to take antibiotics as previously as prescribed.  Encourage fluids and rest.  Follow-up with OB/GYN. Final Clinical Impressions(s) / UC Diagnoses   Final diagnoses:  Yeast vaginitis     Discharge Instructions     Apply the terazol 7 cream for yeast infection.    Continue to take the antibiotics as previously prescribed.   Drink plenty of fluids, especially water, and rest.   Follow up with your OB/GYN  as scheduled.      ED Prescriptions    Medication Sig Dispense Auth. Provider   terconazole (TERAZOL 7) 0.4 % vaginal cream Place 1 applicator vaginally  at bedtime. 45 g Ivette Loyal, NP     PDMP not reviewed this encounter.   Ivette Loyal, NP 10/22/20 2008

## 2020-10-22 NOTE — ED Triage Notes (Signed)
Pt reports white vaginal discharge and vaginal irritation since this morning. Pt reports she started taking Augmentin yesterday (5/16/222) for UTI.   Pt had a positive pregnancy test 10/21/2020.

## 2020-10-22 NOTE — Discharge Instructions (Addendum)
Apply the terazol 7 cream for yeast infection.    Continue to take the antibiotics as previously prescribed.   Drink plenty of fluids, especially water, and rest.   Follow up with your OB/GYN as scheduled.

## 2020-10-26 ENCOUNTER — Emergency Department (HOSPITAL_COMMUNITY): Payer: Medicaid Other

## 2020-10-26 ENCOUNTER — Emergency Department (HOSPITAL_COMMUNITY)
Admission: EM | Admit: 2020-10-26 | Discharge: 2020-10-26 | Disposition: A | Discharge status: Home / Self Care | Payer: Medicaid Other | Attending: Emergency Medicine | Admitting: Emergency Medicine

## 2020-10-26 ENCOUNTER — Other Ambulatory Visit: Payer: Self-pay

## 2020-10-26 DIAGNOSIS — O26859 Spotting complicating pregnancy, unspecified trimester: Secondary | ICD-10-CM | POA: Insufficient documentation

## 2020-10-26 DIAGNOSIS — R109 Unspecified abdominal pain: Secondary | ICD-10-CM

## 2020-10-26 DIAGNOSIS — Z3A00 Weeks of gestation of pregnancy not specified: Secondary | ICD-10-CM | POA: Insufficient documentation

## 2020-10-26 DIAGNOSIS — O4690 Antepartum hemorrhage, unspecified, unspecified trimester: Secondary | ICD-10-CM

## 2020-10-26 LAB — CBC WITH DIFFERENTIAL/PLATELET
Abs Immature Granulocytes: 0.03 10*3/uL (ref 0.00–0.07)
Basophils Absolute: 0 10*3/uL (ref 0.0–0.1)
Basophils Relative: 0 %
Eosinophils Absolute: 0 10*3/uL (ref 0.0–0.5)
Eosinophils Relative: 1 %
HCT: 43.8 % (ref 36.0–46.0)
Hemoglobin: 14.4 g/dL (ref 12.0–15.0)
Immature Granulocytes: 1 %
Lymphocytes Relative: 42 %
Lymphs Abs: 2.8 10*3/uL (ref 0.7–4.0)
MCH: 28.4 pg (ref 26.0–34.0)
MCHC: 32.9 g/dL (ref 30.0–36.0)
MCV: 86.4 fL (ref 80.0–100.0)
Monocytes Absolute: 0.7 10*3/uL (ref 0.1–1.0)
Monocytes Relative: 11 %
Neutro Abs: 3.1 10*3/uL (ref 1.7–7.7)
Neutrophils Relative %: 45 %
Platelets: 268 10*3/uL (ref 150–400)
RBC: 5.07 MIL/uL (ref 3.87–5.11)
RDW: 13 % (ref 11.5–15.5)
WBC: 6.6 10*3/uL (ref 4.0–10.5)
nRBC: 0 % (ref 0.0–0.2)

## 2020-10-26 LAB — URINALYSIS, ROUTINE W REFLEX MICROSCOPIC
Bacteria, UA: NONE SEEN
Bilirubin Urine: NEGATIVE
Glucose, UA: NEGATIVE mg/dL
Ketones, ur: 20 mg/dL — AB
Nitrite: NEGATIVE
Protein, ur: NEGATIVE mg/dL
RBC / HPF: >50 RBC/hpf — ABNORMAL HIGH (ref 0–5)
Specific Gravity, Urine: 1.018 (ref 1.005–1.030)
pH: 6 (ref 5.0–8.0)

## 2020-10-26 LAB — COMPREHENSIVE METABOLIC PANEL
ALT: 13 U/L (ref 0–44)
AST: 17 U/L (ref 15–41)
Albumin: 4.4 g/dL (ref 3.5–5.0)
Alkaline Phosphatase: 54 U/L (ref 38–126)
Anion gap: 8 (ref 5–15)
BUN: 10 mg/dL (ref 6–20)
CO2: 26 mmol/L (ref 22–32)
Calcium: 9.5 mg/dL (ref 8.9–10.3)
Chloride: 104 mmol/L (ref 98–111)
Creatinine, Ser: 0.79 mg/dL (ref 0.44–1.00)
GFR, Estimated: >60 mL/min (ref >60)
Glucose, Bld: 91 mg/dL (ref 70–99)
Potassium: 3.5 mmol/L (ref 3.5–5.1)
Sodium: 138 mmol/L (ref 135–145)
Total Bilirubin: 0.6 mg/dL (ref 0.3–1.2)
Total Protein: 8.1 g/dL (ref 6.5–8.1)

## 2020-10-26 LAB — ABO/RH: ABO/RH(D): O POS

## 2020-10-26 LAB — HCG, QUANTITATIVE, PREGNANCY: hCG, Beta Chain, Quant, S: 169 m[IU]/mL — ABNORMAL HIGH (ref <5)

## 2020-10-26 MED ORDER — ACETAMINOPHEN 325 MG PO TABS
650.0000 mg | ORAL_TABLET | Freq: Once | ORAL | Status: AC
  Administered 2020-10-26: 650 mg via ORAL
  Filled 2020-10-26: qty 2

## 2020-10-26 NOTE — ED Triage Notes (Signed)
Patient reports she is pregnant, unsure how far along but thinking around 5 weeks. Patient says she has been on antibiotic for BV, and then anti fungal for yeast infection. Patient states she was at work this morning and stomach began cramping, patient went to bathroom and saw blood , says it is bright red when she wipes. Unable to state amount of blood. Pain in triage is described as just stomach cramping at the bottom. Patient says her first prenatal appointment is next month.

## 2020-10-26 NOTE — Discharge Instructions (Signed)
It was a pleasure taking care of you today. I hope you feel better. You were seen for vaginal bleeding in early pregnancy.  As discussed, your ultrasound was non-conclusive and did not show anything in the uterus which could be due to miscarriage or too early. Please have your hcg repeated in 48. You may get it performed at your OBGYN, [facility] (at [facility]), or return to the ER. I have included the number of the OB/GYN.  Please call Monday to schedule appoint for further evaluation.  Return to the ER for new or worsening symptoms.

## 2020-10-26 NOTE — ED Provider Notes (Signed)
[facility]-EMERGENCY DEPT Provider Note   CSN: [id number] Arrival date & time: 10/26/20  1329     History Chief Complaint  Patient presents with  . Vaginal Bleeding    Denise Tanner is a 20 y.o. female with no significant past medical history who presents to the ED due to vaginal bleeding and lower abdominal cramping that started earlier today. LMP 09/19/2020, G1P0.  Patient states she took an at home pregnancy test on Sunday which was positive. She notes bleeding is minimal and occurs when wiping with small amount of blood in her underwear.  No previous abdominal operations.  Denies nausea, vomiting, diarrhea.  Denies fever and chills.  Chart reviewed.  Patient was seen at urgent care on 5/16 and diagnosed with a urinary tract infection and was discharged with Augmentin.  Patient then was evaluated on 5/17 due to antibiotic induced yeast infection and was prescribed terconazole vaginal cream.  Patient has dysuria and vaginal discharge.  No treatment prior to arrival.  No aggravating or alleviating symptoms.  History obtained from patient and past medical records. No interpreter used during encounter.      No past medical history on file.  Patient Active Problem List   Diagnosis Date Noted  . Bacterial vaginosis 09/18/2019  . 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 Behavior concern     Past Surgical History:  Procedure Laterality Date  . NO PAST SURGERIES       OB History    Gravida  1   Para  0   Term  0   Preterm  0   AB  0   Living  0     SAB  0   IAB  0   Ectopic  0   Multiple  0   Live Births              Family History  Problem Relation Age of Onset  . Sickle cell anemia Mother     Social History   Tobacco Use  . Smoking status: Never Smoker  . Smokeless tobacco: Never Used  Vaping Use  . Vaping Use: Never used  Substance Use Topics  . Alcohol use: No  . Drug use: No     Home Medications Prior to Admission medications   Medication Sig Start Date End Date Taking? Authorizing Provider  amoxicillin-clavulanate (AUGMENTIN) 500-125 MG tablet Take 1 tablet (500 mg total) by mouth in the morning and at bedtime. 10/21/20   Raspet, Noberto Retort, PA-C  terconazole (TERAZOL 7) 0.4 % vaginal cream Place 1 applicator vaginally at bedtime. 10/22/20   Ivette Loyal, NP  cetirizine (ZYRTEC) 10 MG tablet Take 1 tablet (10 mg total) by mouth daily. 02/11/19 04/04/19  Georgetta Haber, NP  fluticasone (FLONASE) 50 MCG/ACT nasal spray Place 1 spray into both nostrils daily. 02/11/19 04/04/19  Linus Mako B, NP  ipratropium (ATROVENT) 0.03 % nasal spray Place 2 sprays into both nostrils 2 (two) times daily. Patient not taking: Reported on 03/25/2020 07/16/19 10/21/20  Bing Neighbors, FNP  medroxyPROGESTERone (DEPO-PROVERA) 150 MG/ML injection Inject 150 mg into the muscle every 3 (three) months.  11/24/18  [provider]  sodium chloride (OCEAN) 0.65 % SOLN nasal spray Place 1 spray into both nostrils as needed for congestion. And to moisturize nose 02/11/19 04/04/19  Georgetta Haber, NP    Allergies    Patient has no known allergies.  Review of Systems  Review of Systems  Constitutional: Negative for chills and fever.  Respiratory: Negative for shortness of breath.   Cardiovascular: Negative for chest pain.  Gastrointestinal: Positive for abdominal pain. Negative for diarrhea, nausea and vomiting.  Genitourinary: Positive for vaginal bleeding. Negative for dysuria and vaginal discharge.  All other systems reviewed and are negative.   Physical Exam Updated Vital Signs BP 134/85   Pulse 88   Temp 98 F (36.7 C) (Oral)   Resp 18   Ht 5\' 4"  (1.626 m)   Wt 70.8 kg   LMP 09/18/2020 (Exact Date)   SpO2 100%   BMI 26.79 kg/m   Physical Exam Vitals and nursing note reviewed.  Constitutional:      General: She is not in acute distress.    Appearance: She is  not ill-appearing.  HENT:     Head: Normocephalic.  Eyes:     Pupils: Pupils are equal, round, and reactive to light.  Cardiovascular:     Rate and Rhythm: Normal rate and regular rhythm.     Pulses: Normal pulses.     Heart sounds: Normal heart sounds. No murmur heard. No friction rub. No gallop.   Pulmonary:     Effort: Pulmonary effort is normal.     Breath sounds: Normal breath sounds.  Abdominal:     General: Abdomen is flat. There is no distension.     Palpations: Abdomen is soft.     Tenderness: There is no abdominal tenderness. There is no guarding or rebound.     Comments: Abdomen soft, nondistended, nontender to palpation in all quadrants without guarding or peritoneal signs. No rebound.   Genitourinary:    Comments: deferred Musculoskeletal:        General: Normal range of motion.     Cervical back: Neck supple.  Skin:    General: Skin is warm and dry.  Neurological:     General: No focal deficit present.     Mental Status: She is alert.  Psychiatric:        Mood and Affect: Mood normal.        Behavior: Behavior normal.     ED Results / Procedures / Treatments   Labs (all labs ordered are listed, but only abnormal results are displayed) Labs Reviewed  HCG, QUANTITATIVE, PREGNANCY - Abnormal; Notable for the following components:      Result Value   hCG, Beta Chain, Quant, S 169 (*)    All other components within normal limits  URINALYSIS, ROUTINE W REFLEX MICROSCOPIC - Abnormal; Notable for the following components:   APPearance HAZY (*)    Hgb urine dipstick LARGE (*)    Ketones, ur 20 (*)    Leukocytes,Ua MODERATE (*)    RBC / HPF >50 (*)    All other components within normal limits  CBC WITH DIFFERENTIAL/PLATELET  COMPREHENSIVE METABOLIC PANEL  ABO/RH    EKG None  Radiology 09/20/2020 OB Comp < 14 Wks  Result Date: 10/26/2020 CLINICAL DATA:  Vaginal bleeding beginning today. Positive pregnancy test. EXAM: OBSTETRIC <14 WK 10/28/2020 AND TRANSVAGINAL OB US  TECHNIQUE: Both transabdominal and transvaginal ultrasound examinations were performed for complete evaluation of the gestation as well as the maternal uterus, adnexal regions, and pelvic cul-de-sac. Transvaginal technique was performed to assess early pregnancy. COMPARISON:  None. FINDINGS: Intrauterine gestational sac: None Maternal uterus/adnexae: Uterus is retroverted. Endometrial thickness measures 9 mm. No fibroids identified. Both ovaries are normal in appearance. No adnexal mass identified. Small amount of simple free  fluid noted in pelvic cul-de-sac. IMPRESSION: Pregnancy of unknown anatomic location (no intrauterine gestational sac or adnexal mass identified). Differential diagnosis includes recent spontaneous abortion, IUP too early to visualize, and non-visualized ectopic pregnancy. Recommend correlation with serial beta-hCG levels, and follow up US if warranted clinically. Electronically Signed   By: Danae OrleansJohn A Stahl M.D.   On: 10/26/2020 17:16   US OB Transvaginal  Result Date: 10/26/2020 CLINICAL DATA:  Vaginal bleeding beginning today. Positive pregnancy test. EXAM: OBSTETRIC <14 WK US AND TRANSVAGINAL OB US TECHNIQUE: Both transabdominal and transvaginal ultrasound examinations were performed for complete evaluation of the gestation as well as the maternal uterus, adnexal regions, and pelvic cul-de-sac. Transvaginal technique was performed to assess early pregnancy. COMPARISON:  None. FINDINGS: Intrauterine gestational sac: None Maternal uterus/adnexae: Uterus is retroverted. Endometrial thickness measures 9 mm. No fibroids identified. Both ovaries are normal in appearance. No adnexal mass identified. Small amount of simple free fluid noted in pelvic cul-de-sac. IMPRESSION: Pregnancy of unknown anatomic location (no intrauterine gestational sac or adnexal mass identified). Differential diagnosis includes recent spontaneous abortion, IUP too early to visualize, and non-visualized ectopic pregnancy.  Recommend correlation with serial beta-hCG levels, and follow up US if warranted clinically. Electronically Signed   By: Danae OrleansJohn A Stahl M.D.   On: 10/26/2020 17:16    Procedures Procedures   Medications Ordered in ED Medications  acetaminophen (TYLENOL) tablet 650 mg (has no administration in time range)    ED Course  I have reviewed the triage vital signs and the nursing notes.  Pertinent labs & imaging results that were available during my care of the patient were reviewed by me and considered in my medical decision making (see chart for details).  Clinical Course as of 10/26/20 1730  Sat Oct 26, 2020  1509 HCG, Beta Chain, Quant, S(!): 169 [CA]    Clinical Course User Index [CA] Mannie StabileAberman, Alexsia Klindt C, PA-C   MDM Rules/Calculators/A&P                         20 year old female presents to the ED due to vaginal bleeding associated with lower abdominal cramping in early pregnancy.  LMP 09/19/2020.  G1P0.  Upon arrival, vitals all within normal limits. Patient non-toxic appearing. Benign abdomen exam.  Abdomen soft, nondistended, nontender.  ABC and quantitative hCG ordered at triage. Added CMP, UA, and ultrasound to rule out ectopic, but suspect patient will need repeat US and hcg given too early to determine location. Tylenol given for abdominal cramping. Patient deferred pelvic exam.   Quantitative hCG elevated 169.  CBC unremarkable no leukocytosis and normal hemoglobin.  CMP unremarkable with no electrolyte derangements and normal renal function.  UA significant for large hematuria, ketonuria, moderate leukocytes.  No nitrites.  No bacteria. O positive blood type. US personally reviewed which demonstrates: IMPRESSION:  Pregnancy of unknown anatomic location (no intrauterine gestational  sac or adnexal mass identified). Differential diagnosis includes  recent spontaneous abortion, IUP too early to visualize, and  non-visualized ectopic pregnancy. Recommend correlation with serial   beta-hCG levels, and follow up US if warranted clinically.   OB/GYN number given to patient at discharge.  Advised patient have repeat hCG in 48 hours. Strict ED precautions discussed with patient. Patient states understanding and agrees to plan. Patient discharged home in no acute distress and stable vitals. Final Clinical Impression(s) / ED Diagnoses Final diagnoses:  Vaginal bleeding in pregnancy  Abdominal cramping    Rx / DC Orders ED Discharge Orders  None       Jesusita Oka 10/26/20 1806    Tilden Fossa, MD 10/26/20 2023

## 2020-10-26 NOTE — ED Provider Notes (Signed)
Emergency Medicine Provider Triage Evaluation Note  Denise Tanner , a 20 y.o. female  was evaluated in triage.  Pt complains of vaginal bleeding onset today when she used the bathroom with lower abdominal cramping onset today. G1P0, LMP 09/19/20, +HPT at home, went to [facility] for confirmation and was diagnosed with a UTI, started on Augmentin which gave her a yeast infection so she was started on cream.   Review of Systems  Positive: Vaginal bleeding, cramping Negative: Fever, changes in bowel or bladder habits  Physical Exam  BP 129/83 (BP Location: Left Arm)   Pulse 85   Temp 98 F (36.7 C) (Oral)   Resp 16   Ht 5\' 4"  (1.626 m)   Wt 70.8 kg   LMP 09/18/2020 (Exact Date)   SpO2 100%   BMI 26.79 kg/m  Gen:   Awake, no distress   Resp:  Normal effort  MSK:   Moves extremities without difficulty  Other:  Abdomen is soft and non tender  Medical Decision Making  Medically screening exam initiated at 2:00 PM.  Appropriate orders placed.  09/20/2020 Denise Tanner was informed that the remainder of the evaluation will be completed by another provider, this initial triage assessment does not replace that evaluation, and the importance of remaining in the ED until their evaluation is complete.     Jeanann Lewandowsky, PA-C 10/26/20 1402    10/28/20, MD 11/11/20 [id number]

## 2020-10-28 ENCOUNTER — Inpatient Hospital Stay (HOSPITAL_COMMUNITY)
Admission: AD | Admit: 2020-10-28 | Discharge: 2020-10-28 | Disposition: A | Discharge status: Home / Self Care | Payer: Medicaid Other | Attending: Obstetrics & Gynecology | Admitting: Obstetrics & Gynecology

## 2020-10-28 ENCOUNTER — Encounter (HOSPITAL_COMMUNITY): Payer: Self-pay | Admitting: Obstetrics & Gynecology

## 2020-10-28 ENCOUNTER — Other Ambulatory Visit: Payer: Self-pay

## 2020-10-28 DIAGNOSIS — Z6790 Unspecified blood type, Rh positive: Secondary | ICD-10-CM

## 2020-10-28 DIAGNOSIS — O3680X0 Pregnancy with inconclusive fetal viability, not applicable or unspecified: Secondary | ICD-10-CM | POA: Diagnosis present

## 2020-10-28 LAB — HCG, QUANTITATIVE, PREGNANCY: hCG, Beta Chain, Quant, S: 96 m[IU]/mL — ABNORMAL HIGH (ref <5)

## 2020-10-28 NOTE — Progress Notes (Signed)
Chaplain notified by RN that pt and her mother were in the family conference room. Pt has just experienced a miscarriage and is tearful. Chaplain arrived on unit to find family had left. Chaplain spoke with pt over the phone. Denise Tanner was tearful and had a lot of questions about when her miscarriage started and what her miscarriage will mean with regard to being able to get pregnant again. Chaplain normalized the grief of loss even so early in pregnancy and referred pt to her provider for further questions. Denise Tanner shared that this was her first baby and she thought she was due in January. Chaplain asked open ended questions to facilitate story telling and expression of feelings. Chaplain offered grief education and pt said she would like additional resources and consented to receiving them by email. Chaplain sent Grieving and Healing flier and information about chaplain contact and monthly WCC support group to [employer] email address.  Please page as further needs arise.  Maryanna Shape. Carley Hammed, M.Div. [facility] Chaplain Pager [phone] Office [phone]

## 2020-10-28 NOTE — MAU Note (Signed)
Pt decided not to wait for our pastor and will go home and call their own.

## 2020-10-28 NOTE — Discharge Instructions (Signed)
Miscarriage A miscarriage is the loss of pregnancy before the 20th week. Most miscarriages happen during the first 3 months of pregnancy. Sometimes, a miscarriage can happen before a woman knows that she is pregnant. Having a miscarriage can be an emotional experience. If you have had a miscarriage, talk with your health care provider about any questions you may have about the loss of your baby, the grieving process, and your plans for future pregnancy. What are the causes? Many times, the cause of a miscarriage is not known. What increases the risk? The following factors may make a pregnant woman more likely to have a miscarriage: Certain medical conditions  Conditions that affect the hormone balance in the body, such as thyroid disease or polycystic ovary syndrome.  Diabetes.  Autoimmune disorders.  Infections.  Bleeding disorders.  Obesity. Lifestyle factors  Using products with tobacco or nicotine in them or being exposed to tobacco smoke.  Having alcohol.  Having large amounts of caffeine.  Recreational drug use. Problems with reproductive organs or structures  Cervical insufficiency. This is when the lowest part of the uterus (cervix) opens and thins before pregnancy is at term.  Having a condition called Asherman syndrome. This syndrome causes scarring in the uterus or causes the uterus to be abnormal in structure.  Fibrous growths, called fibroids, in the uterus.  Congenital abnormalities. These problems are present at birth.  Infection of the cervix or uterus. Personal or medical history  Injury (trauma).  Having had a miscarriage before.  Being younger than age 18 or older than age 35.  Exposure to harmful substances in the environment. This may include radiation or heavy metals, such as lead.  Use of certain medicines. What are the signs or symptoms? Symptoms of this condition include:  Vaginal bleeding or spotting, with or without cramps or  pain.  Pain or cramping in the abdomen or lower back.  Fluid or tissue coming out of the vagina. How is this diagnosed? This condition may be diagnosed based on:  A physical exam.  Ultrasound.  Lab tests, such as blood tests, urine tests, or swabs for infection. How is this treated? Treatment for a miscarriage is sometimes not needed if all the pregnancy tissue that was in the uterus comes out on its own, and there are no other problems such as infection or heavy bleeding. In other cases, this condition may be treated with:  Dilation and curettage (D&C). In this procedure, the cervix is stretched open and any remaining pregnancy tissue is removed from the lining of the uterus (endometrium).  Medicines. These may include: ? Antibiotic medicine, to treat infection. ? Medicine to help any remaining pregnancy tissue come out of the body. ? Medicine to reduce (contract) the size of the uterus. These medicines may be given if there is a lot of bleeding. If you have Rh-negative blood, you may be given an injection of a medicine called Rho(D) immune globulin. This medicine helps prevent problems with future pregnancies. Follow these instructions at home: Medicines  Take over-the-counter and prescription medicines only as told by your health care provider.  If you were prescribed antibiotic medicine, take it as told by your health care provider. Do not stop taking the antibiotic even if you start to feel better. Activity  Rest as told by your health care provider. Ask your health care provider what activities are safe for you.  Have someone help with home and family responsibilities during this time. General instructions  Monitor how much tissue   or blood clot material comes out of the vagina.  Do not have sex, douche, or put anything, such as tampons, in your vagina until your health care provider says it is okay.  To help you and your partner with the grieving process, talk with your  health care provider or get counseling.  When you are ready, meet with your health care provider to discuss any important steps you should take for your health. Also, discuss steps you should take to have a healthy pregnancy in the future.  Keep all follow-up visits. This is important.   Where to find more information  The American College of Obstetricians and Gynecologists: acog.org  [place] Department of Health and Human Services Office of Women's Health: hrsa.gov/office-womens-health Contact a health care provider if:  You have a fever or chills.  There is bad-smelling fluid coming from the vagina.  You have more bleeding instead of less.  Tissue or blood clots come out of your vagina. Get help right away if:  You have severe cramps or pain in your back or abdomen.  Heavy bleeding soaks through 2 large sanitary pads an hour for more than 2 hours.  You become light-headed or weak.  You faint.  You feel sad, and your sadness takes over your thoughts.  You think about hurting yourself. If you ever feel like you may hurt yourself or others, or have thoughts about taking your own life, get help right away. Go to your nearest emergency department or:  Call your local emergency services (911 in the [place]  Call a suicide crisis helpline, such as the National Suicide Prevention Lifeline at 1-800-273-8255. This is open 24 hours a day in the [place]  Text the Crisis Text Line at 741741 (in the [place] Summary  Most miscarriages happen in the first 3 months of pregnancy. Sometimes miscarriage happens before a woman knows that she is pregnant.  Follow instructions from your health care provider about medicines and activity.  To help you and your partner with grieving, talk with your health care provider or get counseling.  Keep all follow-up visits. This information is not intended to replace advice given to you by your health care provider. Make sure you discuss any questions you  have with your health care provider. Document Revised: 11/24/2019 Document Reviewed: 11/24/2019 Elsevier Patient Education  2021 Elsevier Inc.  

## 2020-10-28 NOTE — MAU Provider Note (Signed)
[name]  is a 20 y.o. G1P0000 at [redacted]w[redacted]d by LMP who presents to MAU today for follow-up quant hCG after 48 hours. The patient was seen in [facility] on 5/21 for VB and abd pain and had quant hCG of 169 and US showed no IUP or adnexal mass. She denies pain but reports small amt of continued vaginal bleeding.  OB History  Gravida Para Term Preterm AB Living  1 0 0 0 0 0  SAB IAB Ectopic Multiple Live Births  0 0 0 0      # Outcome Date GA Lbr Len/2nd Weight Sex Delivery Anes PTL Lv  1 Current             No past medical history on file.  ROS: + VB no pain  BP 118/73 (BP Location: Right Arm)   Pulse 96   Temp 98.3 F (36.8 C) (Oral)   Resp 16   Ht 5\' 4"  (1.626 m)   Wt 63.6 kg   LMP 09/18/2020 (Exact Date)   SpO2 98%   BMI 24.07 kg/m   CONSTITUTIONAL: Well-developed, well-nourished female in no acute distress.  MUSCULOSKELETAL: Normal range of motion.  CARDIOVASCULAR: Regular heart rate RESPIRATORY: Normal effort NEUROLOGICAL: Alert and oriented to person, place, and time.  SKIN: Not diaphoretic. No erythema. No pallor. PSYCH: Normal mood and affect. Normal behavior. Normal judgment and thought content.  Results for orders placed or performed during the hospital encounter of 10/28/20 (from the past 24 hour(s))  hCG, quantitative, pregnancy     Status: Abnormal   Collection Time: 10/28/20  9:49 AM  Result Value Ref Range   hCG, Beta Chain, Quant, S 96 (H) <5 mIU/mL   MDM: Labs ordered and reviewed. Falling qhcg suggestive of SAB. Discussed results with pt, pt upset and crying, condolences given. Will rpt qhcg since no IUP ever seen on 10/30/20. Discussed plan with pt. Stable for discharge home.   A: 1. Blood type, Rh positive   2. Pregnancy, location unknown    P: Discharge home Return precautions discussed Patient will return for follow-up quant HCG at [facility] on 5/25 Patient may return to MAU as needed or if her condition were to change or worsen   6/25,  Donette Larry 10/28/2020 12:11 PM

## 2020-10-28 NOTE — MAU Note (Signed)
Feeling better.  Still having bleeding, red when she wipes. No pain.

## 2020-10-30 ENCOUNTER — Other Ambulatory Visit: Payer: Self-pay

## 2020-10-30 ENCOUNTER — Ambulatory Visit: Payer: Medicaid Other

## 2020-10-30 ENCOUNTER — Ambulatory Visit (INDEPENDENT_AMBULATORY_CARE_PROVIDER_SITE_OTHER): Payer: Medicaid Other

## 2020-10-30 DIAGNOSIS — O4690 Antepartum hemorrhage, unspecified, unspecified trimester: Secondary | ICD-10-CM

## 2020-10-30 DIAGNOSIS — F32A Depression, unspecified: Secondary | ICD-10-CM

## 2020-10-30 NOTE — Progress Notes (Signed)
Pt reports that she is having light pink bleeding today, denies pain. Pt reports feeling really depressed regarding the outcome of the pregnancy.  Pt reports that she wants to talk to a counselor. Edinburgh score = 20 Orders placed for referral

## 2020-10-30 NOTE — Progress Notes (Signed)
S/w pt and advised that provider reviewed HCG results and will need f/u appt in 2 weeks for NON-STAT hcg. Pt agreed, transferred to scheduler

## 2020-10-30 NOTE — Progress Notes (Signed)
Agree with A & P. 

## 2020-10-31 LAB — BETA HCG QUANT (REF LAB): hCG Quant: 18 m[IU]/mL

## 2020-11-13 ENCOUNTER — Other Ambulatory Visit: Payer: Medicaid Other

## 2020-11-19 ENCOUNTER — Ambulatory Visit (INDEPENDENT_AMBULATORY_CARE_PROVIDER_SITE_OTHER): Payer: Medicaid Other | Admitting: Licensed Clinical Social Worker

## 2020-11-19 ENCOUNTER — Telehealth: Payer: Self-pay | Admitting: Licensed Clinical Social Worker

## 2020-11-19 DIAGNOSIS — F4321 Adjustment disorder with depressed mood: Secondary | ICD-10-CM

## 2020-11-19 NOTE — Telephone Encounter (Signed)
Called pt regarding scheduled mychart. Unable to leave voicemail

## 2020-11-21 ENCOUNTER — Other Ambulatory Visit: Payer: Medicaid Other

## 2020-11-21 ENCOUNTER — Other Ambulatory Visit: Payer: Self-pay

## 2020-11-21 DIAGNOSIS — O4690 Antepartum hemorrhage, unspecified, unspecified trimester: Secondary | ICD-10-CM

## 2020-11-22 LAB — BETA HCG QUANT (REF LAB): hCG Quant: <1 m[IU]/mL

## 2020-11-25 NOTE — BH Specialist Note (Signed)
Integrated Behavioral Health via Telemedicine Visit  11/25/2020 Denise GOMM [id number]  Number of Integrated Behavioral Health visits: 1 Session Start time: 1:30pm  Session End time: 1:46pm Total time: 16 mins via mychart   Referring Provider: Rozanna Box RN Patient/Family location: Home  [facility] Provider location: [facility] All persons participating in visit: Pt Z Millea and LCSWA A. Felton Clinton  Types of Service: General Behavioral Integrated Care (BHI)  I connected with Jeanann Lewandowsky Lacuesta and/or Jeanann Lewandowsky Guo's n/a via  Telephone or Engineer, civil (consulting)  (Video is Surveyor, mining) and verified that I am speaking with the correct person using two identifiers. Discussed confidentiality: Yes   I discussed the limitations of telemedicine and the availability of in person appointments.  Discussed there is a possibility of technology failure and discussed alternative modes of communication if that failure occurs.  I discussed that engaging in this telemedicine visit, they consent to the provision of behavioral healthcare and the services will be billed under their insurance.  Patient and/or legal guardian expressed understanding and consented to Telemedicine visit: Yes   Presenting Concerns: Patient and/or family reports the following symptoms/concerns: grief associated with pregnancy loss  Duration of problem: approx 3 weeks ; Severity of problem: mild  Patient and/or Family's Strengths/Protective Factors: Concrete supports in place (healthy food, safe environments, etc.)  Goals Addressed: Patient will:  Reduce symptoms of: depression   Increase knowledge and/or ability of: coping skills   Demonstrate ability to: Begin healthy grieving over loss  Progress towards Goals: Ongoing  Interventions: Interventions utilized:  Motivational Interviewing Standardized Assessments completed: PHQ 9   Assessment: Patient currently experiencing grief associated with  pregnancy loss. [name] reports boyfriend and family is very supportive. [name] reports sadness and crying intermittently. [name] denies past or current history of self harm     Patient may benefit from integrated behavioral health.  Plan: Follow up with behavioral health clinician on : as needed  Behavioral recommendations: Ease back into normal activities when ready, communicate with loved one regarding your needs, prioritze rest  Referral(s): n/a  I discussed the assessment and treatment plan with the patient and/or parent/guardian. They were provided an opportunity to ask questions and all were answered. They agreed with the plan and demonstrated an understanding of the instructions.   They were advised to call back or seek an in-person evaluation if the symptoms worsen or if the condition fails to improve as anticipated.  Gwyndolyn Saxon, LCSW

## 2020-11-29 ENCOUNTER — Encounter: Payer: Medicaid Other | Admitting: Obstetrics and Gynecology

## 2020-12-23 ENCOUNTER — Ambulatory Visit (INDEPENDENT_AMBULATORY_CARE_PROVIDER_SITE_OTHER): Payer: Medicaid Other

## 2020-12-23 ENCOUNTER — Other Ambulatory Visit: Payer: Self-pay

## 2020-12-23 DIAGNOSIS — Z3202 Encounter for pregnancy test, result negative: Secondary | ICD-10-CM | POA: Diagnosis not present

## 2020-12-23 LAB — POCT URINE PREGNANCY: Preg Test, Ur: NEGATIVE

## 2020-12-23 NOTE — Progress Notes (Signed)
Ms. Pyle presents today for UPT. She has no unusual complaints.  LMP: 11/24/2020    OBJECTIVE: Appears well, in no apparent distress.  OB History     Gravida  1   Para  0   Term  0   Preterm  0   AB  0   Living  0      SAB  0   IAB  0   Ectopic  0   Multiple  0   Live Births             Home UPT Result:POSITIVE In-Office UPT result: NEGATIVE  I have reviewed the patient's medical, obstetrical, social, and family histories, and medications.   ASSESSMENT: NEGATIVE pregnancy test  PLAN RTO  in 2 weeks for UPT.

## 2020-12-24 NOTE — Progress Notes (Signed)
Patient was assessed and managed by nursing staff during this encounter. I have reviewed the chart and agree with the documentation and plan. I have also made any necessary editorial changes.  Catalina Antigua, MD 12/24/2020 9:50 AM

## 2020-12-30 ENCOUNTER — Ambulatory Visit (HOSPITAL_COMMUNITY): Admission: EM | Admit: 2020-12-30 | Discharge: 2020-12-30 | Payer: Medicaid Other

## 2020-12-30 ENCOUNTER — Other Ambulatory Visit: Payer: Self-pay

## 2020-12-30 ENCOUNTER — Inpatient Hospital Stay (HOSPITAL_COMMUNITY): Payer: Medicaid Other

## 2020-12-30 ENCOUNTER — Inpatient Hospital Stay (HOSPITAL_COMMUNITY)
Admission: AD | Admit: 2020-12-30 | Discharge: 2020-12-30 | Disposition: A | Discharge status: Home / Self Care | Payer: Medicaid Other | Attending: Family Medicine | Admitting: Family Medicine

## 2020-12-30 ENCOUNTER — Encounter (HOSPITAL_COMMUNITY): Payer: Self-pay | Admitting: Obstetrics and Gynecology

## 2020-12-30 DIAGNOSIS — O039 Complete or unspecified spontaneous abortion without complication: Secondary | ICD-10-CM | POA: Diagnosis not present

## 2020-12-30 DIAGNOSIS — O3680X0 Pregnancy with inconclusive fetal viability, not applicable or unspecified: Secondary | ICD-10-CM

## 2020-12-30 DIAGNOSIS — O209 Hemorrhage in early pregnancy, unspecified: Secondary | ICD-10-CM | POA: Diagnosis not present

## 2020-12-30 DIAGNOSIS — O009 Unspecified ectopic pregnancy without intrauterine pregnancy: Secondary | ICD-10-CM | POA: Diagnosis present

## 2020-12-30 HISTORY — DX: Other specified health status: Z78.9

## 2020-12-30 LAB — CBC
HCT: 41.5 % (ref 36.0–46.0)
Hemoglobin: 13.9 g/dL (ref 12.0–15.0)
MCH: 28.1 pg (ref 26.0–34.0)
MCHC: 33.5 g/dL (ref 30.0–36.0)
MCV: 84 fL (ref 80.0–100.0)
Platelets: 299 10*3/uL (ref 150–400)
RBC: 4.94 MIL/uL (ref 3.87–5.11)
RDW: 12.2 % (ref 11.5–15.5)
WBC: 5.1 10*3/uL (ref 4.0–10.5)
nRBC: 0 % (ref 0.0–0.2)

## 2020-12-30 LAB — WET PREP, GENITAL
Clue Cells Wet Prep HPF POC: NONE SEEN
Sperm: NONE SEEN
Trich, Wet Prep: NONE SEEN
Yeast Wet Prep HPF POC: NONE SEEN

## 2020-12-30 LAB — POC URINE PREG, ED: Preg Test, Ur: POSITIVE — AB

## 2020-12-30 LAB — HCG, QUANTITATIVE, PREGNANCY: hCG, Beta Chain, Quant, S: 426 m[IU]/mL — ABNORMAL HIGH (ref <5)

## 2020-12-30 MED ORDER — VITAFOL GUMMIES 3.33-0.333-34.8 MG PO CHEW: 3.0000 | CHEWABLE_TABLET | Freq: Every day | ORAL | 11 refills | Status: DC

## 2020-12-30 NOTE — MAU Provider Note (Signed)
History     CSN: [id number]  Arrival date and time: 12/30/20 1112   Event Date/Time   First Provider Initiated Contact with Patient 12/30/20 1214      Chief Complaint  Patient presents with   Vaginal Bleeding   HPI Denise Tanner 20 y.o. [redacted]w[redacted]d Comes to MAU today - walked from Urgent Care.  Positive pregnancy test and she is bleeding. Wore a pad in and had more bleeding since walking from Urgent Care.  Had a miscarriage in May and had one menses on June 16.  Thinks she became pregnant at the end of June.   OB History     Gravida  2   Para  0   Term  0   Preterm  0   AB  0   Living  0      SAB  0   IAB  0   Ectopic  0   Multiple  0   Live Births              Past Medical History:  Diagnosis Date   Medical history non-contributory     Past Surgical History:  Procedure Laterality Date   NO PAST SURGERIES      Family History  Problem Relation Age of Onset   Sickle cell anemia Mother     Social History   Tobacco Use   Smoking status: Never   Smokeless tobacco: Never  Vaping Use   Vaping Use: Never used  Substance Use Topics   Alcohol use: No   Drug use: No    Allergies: No Known Allergies  Medications Prior to Admission  Medication Sig Dispense Refill Last Dose   amoxicillin-clavulanate (AUGMENTIN) 500-125 MG tablet Take 1 tablet (500 mg total) by mouth in the morning and at bedtime. 14 tablet 0    terconazole (TERAZOL 7) 0.4 % vaginal cream Place 1 applicator vaginally at bedtime. (Patient not taking: Reported on 10/30/2020) 45 g 0     Review of Systems  Constitutional:  Negative for fever.  Respiratory:  Negative for cough and shortness of breath.   Gastrointestinal:  Negative for abdominal pain, nausea and vomiting.  Genitourinary:  Positive for vaginal bleeding. Negative for dysuria and vaginal discharge.  Physical Exam   Blood pressure 120/77, pulse (!) 116, temperature 98.6 F (37 C), temperature source Oral, resp. rate 18,  height 5\' 5"  (1.651 m), weight 65.6 kg, last menstrual period 11/21/2020, SpO2 100 %, unknown if currently breastfeeding.  Physical Exam Vitals and nursing note reviewed.  Constitutional:      Appearance: She is well-developed.  HENT:     Head: Normocephalic.  Cardiovascular:     Rate and Rhythm: Regular rhythm.  Pulmonary:     Effort: Pulmonary effort is normal.  Abdominal:     Palpations: Abdomen is soft.     Tenderness: There is no abdominal tenderness. There is no guarding or rebound.  Genitourinary:    Comments: Speculum exam: Vagina - Mod amount of red blood with some pooling in the vault, Cervix - No active bleeding, cervix appears closed GC/Chlam, wet prep done Chaperone present for exam.  Musculoskeletal:        General: Normal range of motion.     Cervical back: Neck supple.  Skin:    General: Skin is warm and dry.  Neurological:     Mental Status: She is alert and oriented to person, place, and time.  Psychiatric:        Mood  and Affect: Mood normal.        Behavior: Behavior normal.        Thought Content: Thought content normal.    MAU Course  Procedures Labs Results for orders placed or performed during the hospital encounter of 12/30/20 (from the past 24 hour(s))  CBC     Status: None   Collection Time: 12/30/20 12:24 PM  Result Value Ref Range   WBC 5.1 4.0 - 10.5 K/uL   RBC 4.94 3.87 - 5.11 MIL/uL   Hemoglobin 13.9 12.0 - 15.0 g/dL   HCT 70.1 77.9 - 39.0 %   MCV 84.0 80.0 - 100.0 fL   MCH 28.1 26.0 - 34.0 pg   MCHC 33.5 30.0 - 36.0 g/dL   RDW 30.0 92.3 - 30.0 %   Platelets 299 150 - 400 K/uL   nRBC 0.0 0.0 - 0.2 %  hCG, quantitative, pregnancy     Status: Abnormal   Collection Time: 12/30/20 12:24 PM  Result Value Ref Range   hCG, Beta Chain, Quant, S 426 (H) <5 mIU/mL  Wet prep, genital     Status: Abnormal   Collection Time: 12/30/20 12:36 PM   Specimen: PATH Cytology Ancillary Only  Result Value Ref Range   Yeast Wet Prep HPF POC NONE  SEEN NONE SEEN   Trich, Wet Prep NONE SEEN NONE SEEN   Clue Cells Wet Prep HPF POC NONE SEEN NONE SEEN   WBC, Wet Prep HPF POC MODERATE (A) NONE SEEN   Sperm NONE SEEN      Ultrasound CLINICAL DATA:  Vaginal bleeding in pregnancy, 1st trimester   EXAM: OBSTETRIC <14 WK [place] AND TRANSVAGINAL OB US   TECHNIQUE: Both transabdominal and transvaginal ultrasound examinations were performed for complete evaluation of the gestation as well as the maternal uterus, adnexal regions, and pelvic cul-de-sac. Transvaginal technique was performed to assess early pregnancy.   COMPARISON:  None.   FINDINGS: Intrauterine gestational sac: None   Yolk sac:  Not Visualized.   Embryo:  Not Visualized.   Cardiac Activity: Not Visualized.   Heart Rate:   bpm   MSD:   mm    w     d   CRL:    mm    w    d                  [place] EDC:   Subchorionic hemorrhage:  None visualized.   Maternal uterus/adnexae: No adnexal mass. Small amount of free fluid in the pelvis.   IMPRESSION: No intrauterine pregnancy visualized. Differential considerations would include early intrauterine pregnancy too early to visualize, spontaneous abortion, or occult ectopic pregnancy. Recommend close clinical followup and serial quantitative beta HCGs and ultrasounds.    MDM Patient informed that at this time the pregnancy cannot be confirmed to be in the uterus as no yolk sac is visualized on ultrasound.  Advised to return with worsening symptoms as an ectopic pregnancy cannot be ruled out at this time and this could be a life threatening condition.  Has had difficulty with grief since the miscarriage - teary today.  Does not want to return to the [place] office for blood work as that is where she was when she had blood work previously.  Her experience of miscarriage is affecting her perspective on that office and she wants to go to a different office - appointment for follow up made at [facility].  Assessment and Plan  Pregnancy  of unknown anatomic location  Plan Will do repeat quant on Friday morning. Return to MAU with severe vaginal bleeding or severe abdominal pain.  Pelvic rest - no tampons, no douching, no sex, nothing in the vagina. No heavy lifting or strenuous exercise.  Patient is very worried that the medication she was prescribed at [facility] for UTI will cause a miscarriage.  Advised she could defer the rest of the course of medication. Prescribed prenatal vitamins. Advised if this pregnancy ends in miscarriage, will need to wait 4-6 months before becoming pregnant again.  Maurice Ramseur L Devan Babino 12/30/2020, 12:40 PM

## 2020-12-30 NOTE — MAU Note (Signed)
Was started on antibiotics for UTI last wk.  Started spotting last night. Went to UC, preg confirmed- is bleeding heavier now. Denies pain.

## 2020-12-30 NOTE — Discharge Instructions (Signed)
Nothing in the vagina. Return if you have severely worsened pain or severe bleeding. You have an appointment on Friday for repeat labs at 8:30 am

## 2020-12-30 NOTE — ED Notes (Signed)
Patient is being discharged from the Urgent Care and sent to the Emergency Department via personal vehicle . Per Provider Ignacia Bayley, patient is in need of higher level of care due to bleeding in early pregnancy. Patient is aware and verbalizes understanding of plan of care. There were no vitals filed for this visit.

## 2020-12-31 ENCOUNTER — Encounter (HOSPITAL_COMMUNITY): Payer: Self-pay | Admitting: Obstetrics and Gynecology

## 2020-12-31 ENCOUNTER — Inpatient Hospital Stay (HOSPITAL_COMMUNITY)
Admission: AD | Admit: 2020-12-31 | Discharge: 2020-12-31 | Disposition: A | Discharge status: Home / Self Care | Payer: Medicaid Other | Attending: Obstetrics and Gynecology | Admitting: Obstetrics and Gynecology

## 2020-12-31 ENCOUNTER — Telehealth: Payer: Self-pay | Admitting: General Practice

## 2020-12-31 ENCOUNTER — Inpatient Hospital Stay (HOSPITAL_COMMUNITY): Payer: Medicaid Other

## 2020-12-31 DIAGNOSIS — O4690 Antepartum hemorrhage, unspecified, unspecified trimester: Secondary | ICD-10-CM

## 2020-12-31 DIAGNOSIS — O039 Complete or unspecified spontaneous abortion without complication: Secondary | ICD-10-CM

## 2020-12-31 DIAGNOSIS — Z3A01 Less than 8 weeks gestation of pregnancy: Secondary | ICD-10-CM | POA: Diagnosis not present

## 2020-12-31 LAB — CBC WITH DIFFERENTIAL/PLATELET
Abs Immature Granulocytes: 0.02 10*3/uL (ref 0.00–0.07)
Basophils Absolute: 0 10*3/uL (ref 0.0–0.1)
Basophils Relative: 1 %
Eosinophils Absolute: 0 10*3/uL (ref 0.0–0.5)
Eosinophils Relative: 1 %
HCT: 39.9 % (ref 36.0–46.0)
Hemoglobin: 13.4 g/dL (ref 12.0–15.0)
Immature Granulocytes: 0 %
Lymphocytes Relative: 30 %
Lymphs Abs: 1.9 10*3/uL (ref 0.7–4.0)
MCH: 28.2 pg (ref 26.0–34.0)
MCHC: 33.6 g/dL (ref 30.0–36.0)
MCV: 83.8 fL (ref 80.0–100.0)
Monocytes Absolute: 0.6 10*3/uL (ref 0.1–1.0)
Monocytes Relative: 9 %
Neutro Abs: 3.9 10*3/uL (ref 1.7–7.7)
Neutrophils Relative %: 59 %
Platelets: 278 10*3/uL (ref 150–400)
RBC: 4.76 MIL/uL (ref 3.87–5.11)
RDW: 12.2 % (ref 11.5–15.5)
WBC: 6.5 10*3/uL (ref 4.0–10.5)
nRBC: 0 % (ref 0.0–0.2)

## 2020-12-31 LAB — GC/CHLAMYDIA PROBE AMP (~~LOC~~) NOT AT ARMC
Chlamydia: NEGATIVE
Comment: NEGATIVE
Comment: NORMAL
Neisseria Gonorrhea: NEGATIVE

## 2020-12-31 LAB — RPR: RPR Ser Ql: NONREACTIVE

## 2020-12-31 LAB — HCG, QUANTITATIVE, PREGNANCY: hCG, Beta Chain, Quant, S: 200 m[IU]/mL — ABNORMAL HIGH (ref <5)

## 2020-12-31 NOTE — MAU Note (Signed)
Last night there was pain, none now.  Still having bleeding.  Looks like a lot when she uses the restroom, was changing every 3 hrs, now is lighter.

## 2020-12-31 NOTE — MAU Provider Note (Signed)
History     CSN: [id number]  Arrival date and time: 12/31/20 1006   Event Date/Time   First Provider Initiated Contact with Patient 12/31/20 1039      Chief Complaint  Patient presents with   Vaginal Bleeding   HPI [name] is a 20 y.o. G2P0010 at [redacted]w[redacted]d who presents to MAU today with complaint of increased vaginal bleeding. The patient was seen in MAU yesterday and had PUL noted on [place]. Quant hCG was 426. She states that overnight she had a significant increase in bleeding with pain. It has slowed throughout the day. She denies fever.    OB History     Gravida  2   Para  0   Term  0   Preterm  0   AB  1   Living  0      SAB  1   IAB  0   Ectopic  0   Multiple  0   Live Births              Past Medical History:  Diagnosis Date   Medical history non-contributory     Past Surgical History:  Procedure Laterality Date   NO PAST SURGERIES      Family History  Problem Relation Age of Onset   Sickle cell anemia Mother     Social History   Tobacco Use   Smoking status: Never   Smokeless tobacco: Never  Vaping Use   Vaping Use: Never used  Substance Use Topics   Alcohol use: No   Drug use: No    Allergies: No Known Allergies  Medications Prior to Admission  Medication Sig Dispense Refill Last Dose   amoxicillin-clavulanate (AUGMENTIN) 500-125 MG tablet Take 1 tablet (500 mg total) by mouth in the morning and at bedtime. 14 tablet 0    Prenatal Vit-Fe Phos-FA-Omega (VITAFOL GUMMIES) 3.33-0.333-34.8 MG CHEW Chew 3 each by mouth daily. 90 tablet 11     Review of Systems  Constitutional:  Negative for fever.  Gastrointestinal:  Positive for abdominal pain.  Genitourinary:  Positive for vaginal bleeding.  Physical Exam   Blood pressure 113/64, pulse 89, temperature 97.9 F (36.6 C), temperature source Oral, resp. rate 16, height 5\' 5"  (1.651 m), weight 64.8 kg, last menstrual period 11/21/2020, SpO2 100 %, unknown if currently  breastfeeding.  Physical Exam Vitals and nursing note reviewed.  Constitutional:      General: She is not in acute distress.    Appearance: She is well-developed and normal weight. She is not ill-appearing.     Comments: Appropriately upset   HENT:     Head: Normocephalic and atraumatic.  Cardiovascular:     Rate and Rhythm: Normal rate.  Pulmonary:     Effort: Pulmonary effort is normal.  Abdominal:     Palpations: Abdomen is soft.  Genitourinary:    Comments: Deferred Skin:    General: Skin is warm and dry.     Findings: No erythema.  Neurological:     Mental Status: She is alert and oriented to person, place, and time.  Psychiatric:        Mood and Affect: Mood normal.     Results for orders placed or performed during the hospital encounter of 12/31/20 (from the past 24 hour(s))  CBC with Differential/Platelet     Status: None   Collection Time: 12/31/20 11:14 AM  Result Value Ref Range   WBC 6.5 4.0 - 10.5 K/uL   RBC  4.76 3.87 - 5.11 MIL/uL   Hemoglobin 13.4 12.0 - 15.0 g/dL   HCT 33.2 95.1 - 88.4 %   MCV 83.8 80.0 - 100.0 fL   MCH 28.2 26.0 - 34.0 pg   MCHC 33.6 30.0 - 36.0 g/dL   RDW 16.6 06.3 - 01.6 %   Platelets 278 150 - 400 K/uL   nRBC 0.0 0.0 - 0.2 %   Neutrophils Relative % 59 %   Neutro Abs 3.9 1.7 - 7.7 K/uL   Lymphocytes Relative 30 %   Lymphs Abs 1.9 0.7 - 4.0 K/uL   Monocytes Relative 9 %   Monocytes Absolute 0.6 0.1 - 1.0 K/uL   Eosinophils Relative 1 %   Eosinophils Absolute 0.0 0.0 - 0.5 K/uL   Basophils Relative 1 %   Basophils Absolute 0.0 0.0 - 0.1 K/uL   Immature Granulocytes 0 %   Abs Immature Granulocytes 0.02 0.00 - 0.07 K/uL  hCG, quantitative, pregnancy     Status: Abnormal   Collection Time: 12/31/20 11:14 AM  Result Value Ref Range   hCG, Beta Chain, Quant, S 200 (H) <5 mIU/mL    MAU Course  Procedures None  MDM CBC stable Hgb hCG with > 50 % drop   Assessment and Plan  A: SAB  P:  Discharge home Bleeding  precautions discussed Patient advised to follow-up with [facility] in 1 week for repeat labs and 2 weeks with provider In-Basket message sent to [facility] for follow-up, will cancel original STAT lab appt for later this week.  Patient may return to MAU as needed or if her condition were to change or worsen  Vonzella Nipple, PA-C 12/31/2020, 4:43 PM

## 2020-12-31 NOTE — Telephone Encounter (Signed)
Patient called into front office requesting a call back from the nurse because she is afraid she is having a miscarriage. Called patient stating I am returning her phone call. Patient was already in the car headed to the hospital. Patient reports she started gushing out blood this morning heavier than a period and is having bad cramping rated at a 7-8. Told patient I would call the hospital and let them know she is on the way. Patient was appreciative.

## 2021-01-03 ENCOUNTER — Ambulatory Visit: Payer: Self-pay

## 2021-01-31 ENCOUNTER — Other Ambulatory Visit: Payer: Medicaid Other

## 2021-02-06 ENCOUNTER — Other Ambulatory Visit: Payer: Self-pay

## 2021-02-06 ENCOUNTER — Ambulatory Visit (INDEPENDENT_AMBULATORY_CARE_PROVIDER_SITE_OTHER): Payer: Medicaid Other

## 2021-02-06 ENCOUNTER — Other Ambulatory Visit (HOSPITAL_COMMUNITY)
Admission: RE | Admit: 2021-02-06 | Discharge: 2021-02-06 | Disposition: A | Discharge status: Home / Self Care | Payer: Medicaid Other | Source: Ambulatory Visit | Attending: Obstetrics | Admitting: Obstetrics

## 2021-02-06 VITALS — BP 116/72 | HR 72

## 2021-02-06 DIAGNOSIS — N898 Other specified noninflammatory disorders of vagina: Secondary | ICD-10-CM

## 2021-02-06 NOTE — Progress Notes (Signed)
SUBJECTIVE:  20 y.o. female complains of clear vaginal discharge for of days. Denies abnormal vaginal bleeding or significant pelvic pain or fever. No UTI symptoms. Denies history of known exposure to STD.  Patient's last menstrual period was 11/21/2020 (exact date).  OBJECTIVE:  She appears well, afebrile. Urine dipstick: negative for all components.  ASSESSMENT:  Vaginal Discharge yes Vaginal Odor yes   PLAN:  GC, chlamydia, trichomonas, BVAG, CVAG probe sent to lab. Treatment: To be determined once lab results are received ROV prn if symptoms persist or worsen.

## 2021-02-11 LAB — CERVICOVAGINAL ANCILLARY ONLY
Bacterial Vaginitis (gardnerella): POSITIVE — AB
Candida Glabrata: NEGATIVE
Candida Vaginitis: NEGATIVE
Chlamydia: NEGATIVE
Comment: NEGATIVE
Comment: NEGATIVE
Comment: NEGATIVE
Comment: NEGATIVE
Comment: NEGATIVE
Comment: NORMAL
Neisseria Gonorrhea: NEGATIVE
Trichomonas: NEGATIVE

## 2021-02-13 ENCOUNTER — Other Ambulatory Visit: Payer: Self-pay | Admitting: *Deleted

## 2021-02-13 DIAGNOSIS — B9689 Other specified bacterial agents as the cause of diseases classified elsewhere: Secondary | ICD-10-CM

## 2021-02-13 MED ORDER — METRONIDAZOLE 500 MG PO TABS: 500.0000 mg | ORAL_TABLET | Freq: Two times a day (BID) | ORAL | 0 refills | Status: DC

## 2021-02-13 NOTE — Progress Notes (Signed)
Pt had recent +BV result,  Rx to be sent today.

## 2021-02-19 LAB — POCT URINE QUALITATIVE DIPSTICK PROTEIN: Protein, UA: NEGATIVE

## 2021-02-24 ENCOUNTER — Encounter: Payer: Self-pay | Admitting: *Deleted

## 2021-02-24 ENCOUNTER — Telehealth: Payer: Self-pay | Admitting: *Deleted

## 2021-02-24 NOTE — Congregational Nurse Program (Signed)
  Dept: [phone]   Congregational Nurse Program Note  Date of Encounter: 02/24/2021  Past Medical History: Past Medical History:  Diagnosis Date   Medical history non-contributory     Encounter Details:  CNP Questionnaire - 02/24/21 1224       Questionnaire   Do you give verbal consent to treat you today? Yes    Visit Setting Church or Organization    Location Patient Served At [facility]    Patient Status Homeless    Medical Provider Yes    Insurance Medicaid    Intervention Support;Refer;Counsel    Referrals Behavioral/Mental Health Provider   [facility]           Client came into [facility] along with her great aunt requesting help with therapy. Client has had multiple loses including two miscarriages, cousin and friend in  2021 along with father and grandmother in 2009. [facility]ed [facility]. A therapist will be contacting client for intake. Offered support and encouragement. Client denies si and hi.  Caeley Dohrmann W RN CN [phone]

## 2021-02-24 NOTE — Telephone Encounter (Signed)
Called [facility] to set up appt. They will have therapist contact client for intake.

## 2021-05-23 ENCOUNTER — Ambulatory Visit: Payer: Medicaid Other

## 2021-06-03 IMAGING — US US OB TRANSVAGINAL
1 series · 14 of 28 positions shown · non-contrast
Comparison: None.

CLINICAL DATA: Vaginal bleeding beginning today. Positive pregnancy
test.

EXAM:
OBSTETRIC <14 WK US AND TRANSVAGINAL OB US
TECHNIQUE: Both transabdominal and transvaginal ultrasound examinations were
performed for complete evaluation of the gestation as well as the
maternal uterus, adnexal regions, and pelvic cul-de-sac.
Transvaginal technique was performed to assess early pregnancy.

[Series 1: us ob transvaginal · 114 acquisitions, 14 frames shown]
[im 5/114]
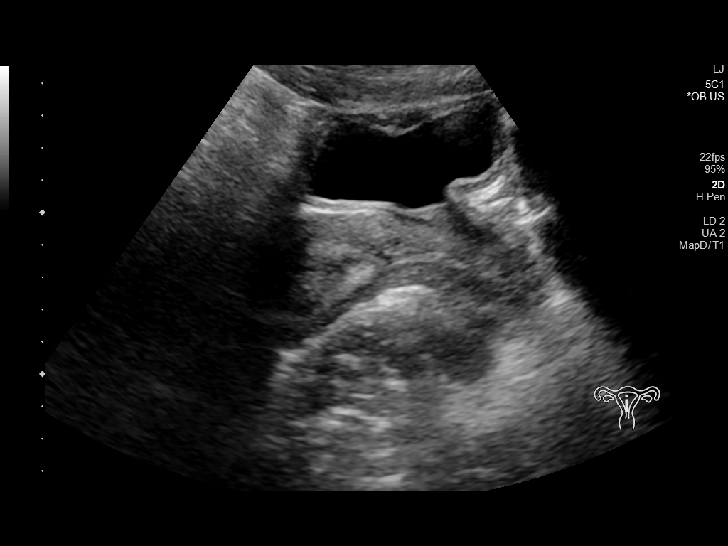
[im 13/114]
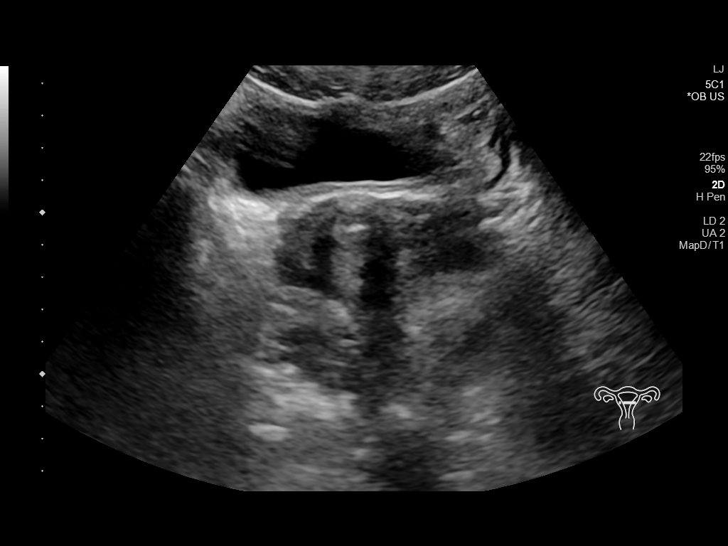
[im 21/114]
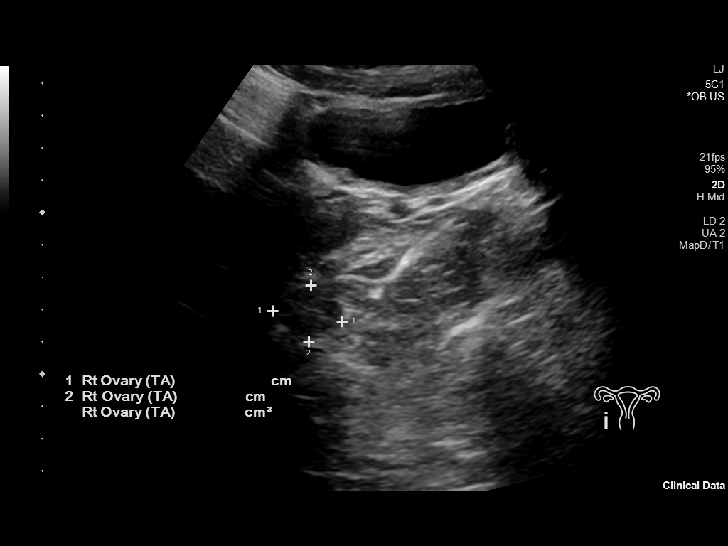
[im 30/114]
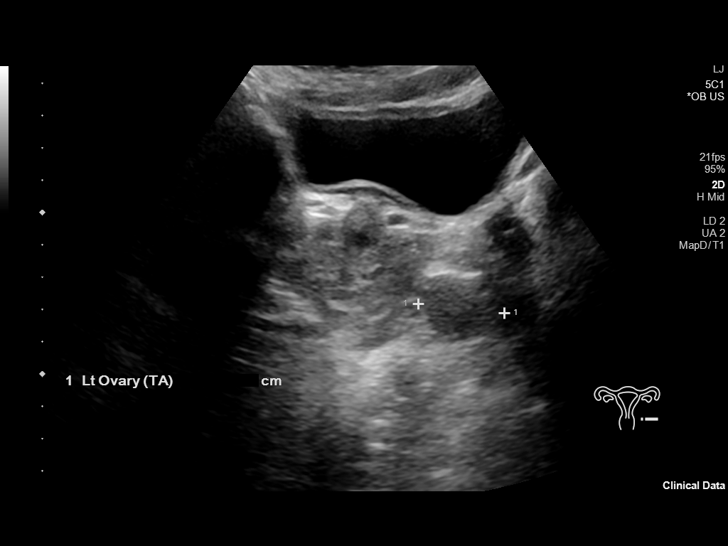
[im 38/114]
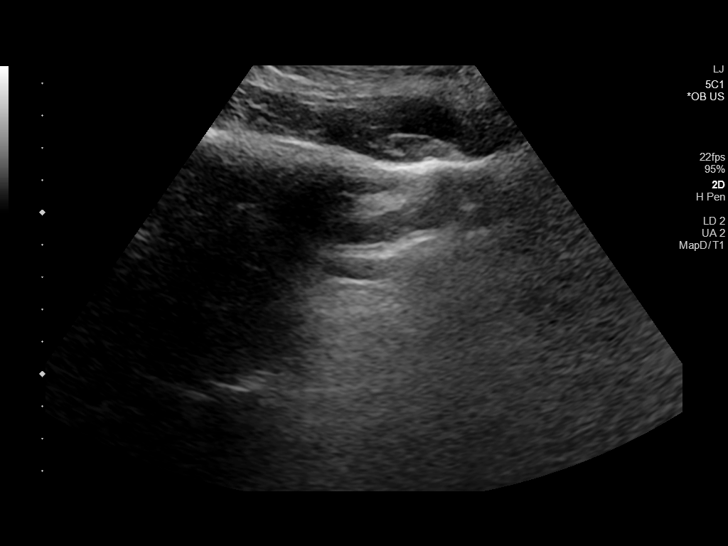
[im 47/114]
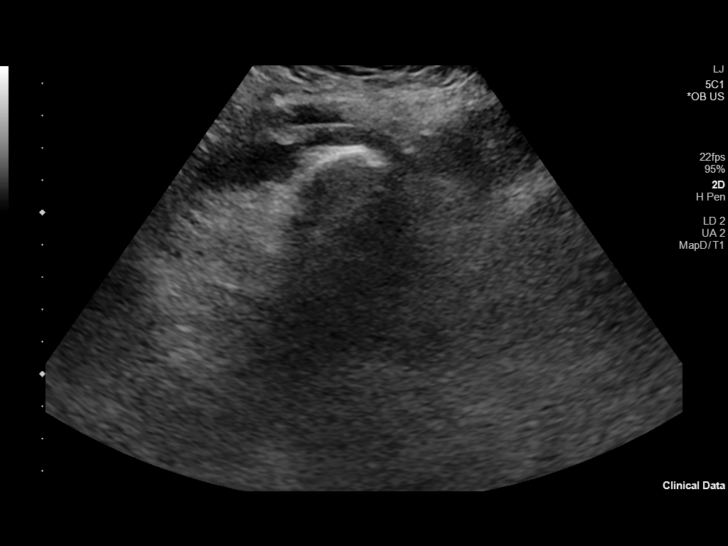
[im 55/114]
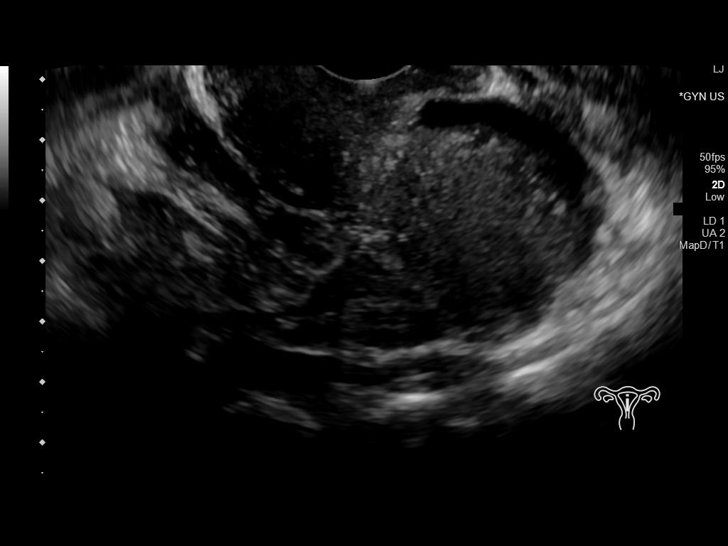
[im 63/114]
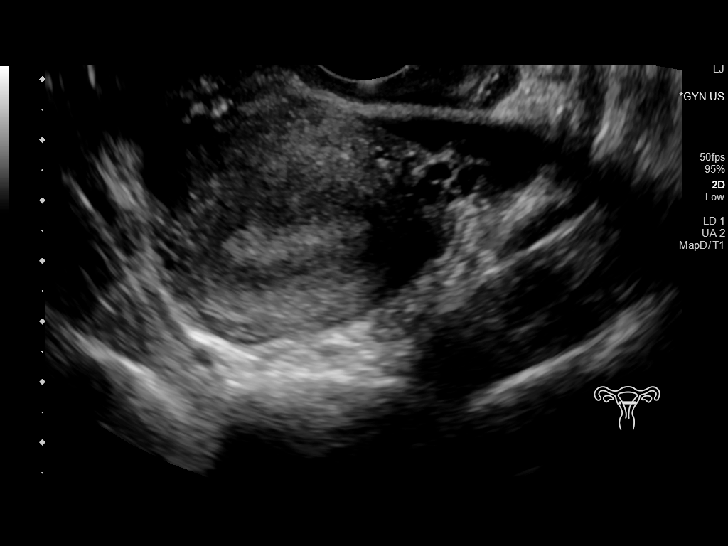
[im 72/114]
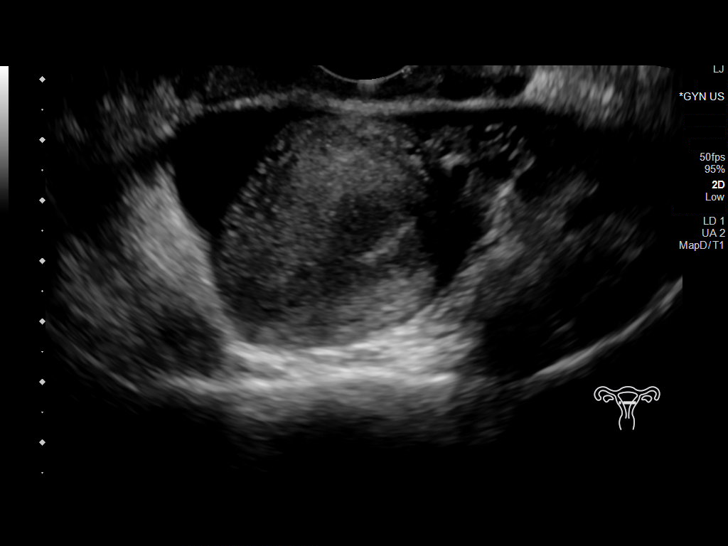
[im 80/114]
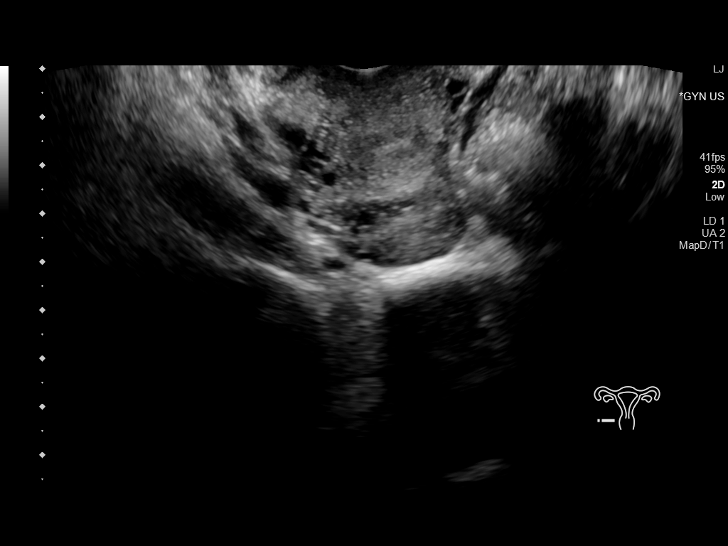
[im 88/114]
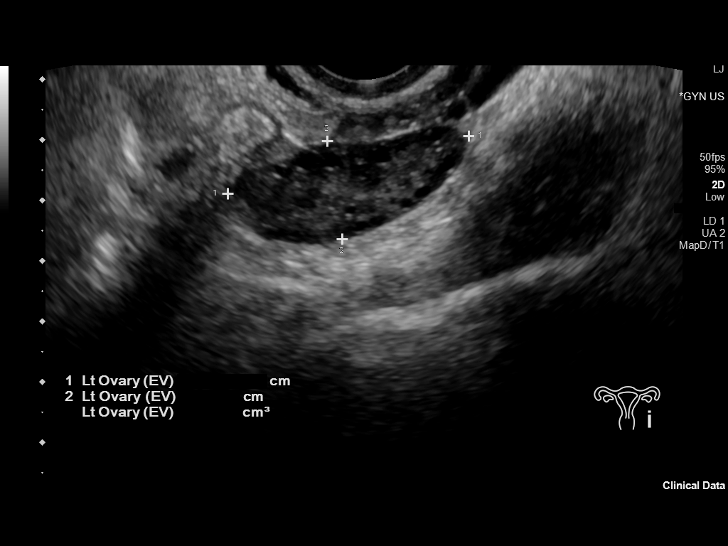
[im 97/114]
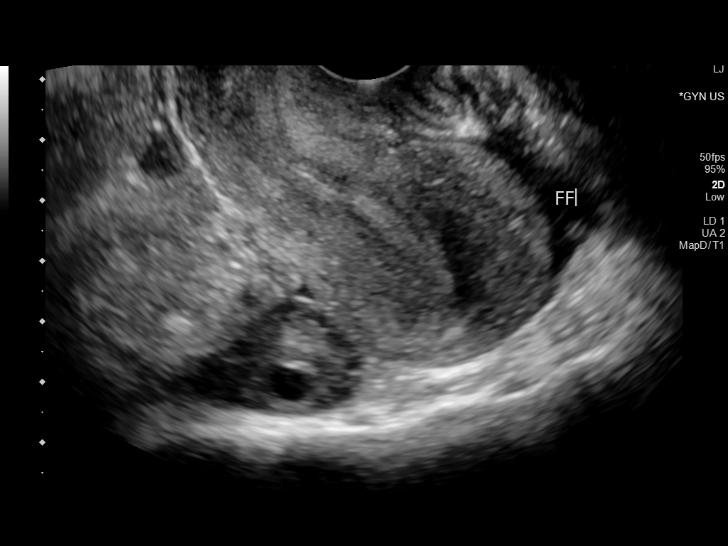
[im 105/114]
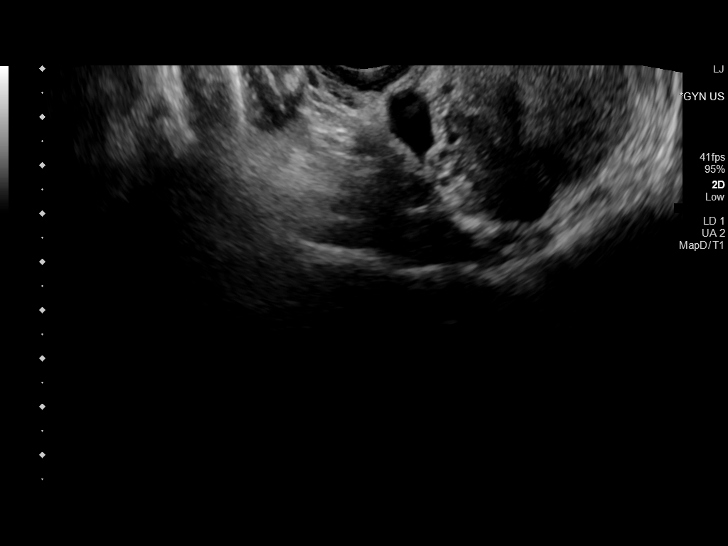
[im 114/114]
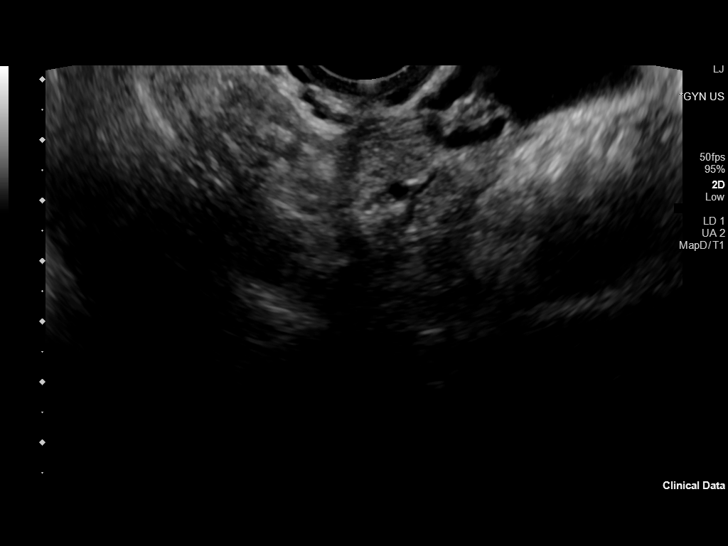

[14 of 28 positions shown; findings below may reference images not displayed]

FINDINGS: Intrauterine gestational sac: None

Maternal uterus/adnexae: Uterus is retroverted. Endometrial
thickness measures 9 mm. No fibroids identified.

Both ovaries are normal in appearance. No adnexal mass identified.
Small amount of simple free fluid noted in pelvic cul-de-sac.
IMPRESSION: Pregnancy of unknown anatomic location (no intrauterine gestational
sac or adnexal mass identified). Differential diagnosis includes
recent spontaneous abortion, IUP too early to visualize, and
non-visualized ectopic pregnancy. Recommend correlation with serial
beta-hCG levels, and follow up US if warranted clinically.

## 2021-06-08 NOTE — L&D Delivery Note (Signed)
OB/GYN Faculty Practice Delivery Note  Denise Tanner is a 21 y.o. G3P0020 s/p vag del at [redacted]w[redacted]d. She was admitted for PROM.   ROM: 22h 2m with initially clear fluid; became MSF in active labor GBS Status: neg Maximum Maternal Temperature: prior to del was 98.8; just after delivery 100.7  Labor Progress: [name] was admitted with PROM and given Pit for augmentation. She had SROM of a forebag in the early afternoon with clear fluid. She remained afebrile during her course of labor and pushed a little less than 2h to delivery.  Delivery Date/Time: April 16, 2022 at 0050 Delivery: Called to room and patient was complete and pushing. Head delivered LOA. Loose body cord present. Shoulder and body delivered in usual fashion. Infant with spontaneous cry, placed on mother's abdomen, dried and stimulated. Cord clamped x 2 after <1-minute delay due to infant with poor resp attempt, and cut by FOB then infant to warmer and awaiting peds team. Cord blood drawn. Placenta delivered spontaneously with gentle cord traction. Fundus firm with massage and Pitocin. Labia, perineum, vagina, and cervix inspected and found to be intact.   Placenta: spont, intact; to path Complications: infant w stridor upon peds eval; probably to NICU for stabilization Lacerations: none EBL: 128cc Analgesia: epidural  Postpartum Planning [x]  message to sent to schedule follow-up   Infant: boy  APGARs 7/8  3250g (7lb 2.6oz)  9/8, CNM  04/16/2022 1:14 AM

## 2021-06-11 ENCOUNTER — Encounter (HOSPITAL_COMMUNITY): Payer: Self-pay | Admitting: Emergency Medicine

## 2021-06-11 ENCOUNTER — Other Ambulatory Visit: Payer: Self-pay

## 2021-06-11 ENCOUNTER — Ambulatory Visit (HOSPITAL_COMMUNITY)
Admission: EM | Admit: 2021-06-11 | Discharge: 2021-06-11 | Disposition: A | Discharge status: Home / Self Care | Payer: Medicaid Other | Attending: Emergency Medicine | Admitting: Emergency Medicine

## 2021-06-11 DIAGNOSIS — N926 Irregular menstruation, unspecified: Secondary | ICD-10-CM | POA: Diagnosis not present

## 2021-06-11 DIAGNOSIS — J029 Acute pharyngitis, unspecified: Secondary | ICD-10-CM | POA: Diagnosis present

## 2021-06-11 LAB — POCT RAPID STREP A, ED / UC: Streptococcus, Group A Screen (Direct): NEGATIVE

## 2021-06-11 MED ORDER — FLUTICASONE PROPIONATE 50 MCG/ACT NA SUSP: 1.0000 | Freq: Every day | NASAL | 1 refills | Status: DC

## 2021-06-11 MED ORDER — CETIRIZINE HCL 10 MG PO TABS: 10.0000 mg | ORAL_TABLET | Freq: Every day | ORAL | 0 refills | Status: DC

## 2021-06-11 MED ORDER — IBUPROFEN 800 MG PO TABS: 800.0000 mg | ORAL_TABLET | Freq: Three times a day (TID) | ORAL | 0 refills | Status: DC

## 2021-06-11 MED ORDER — LIDOCAINE VISCOUS HCL 2 % MT SOLN: 15.0000 mL | OROMUCOSAL | 0 refills | Status: DC | PRN

## 2021-06-11 NOTE — ED Notes (Signed)
No answer in lobby.

## 2021-06-11 NOTE — Discharge Instructions (Addendum)
Your rapid strep test today was negative, your throat sample has been sent to the lab to see if it will grow bacteria, if this occurs you will be notified and antibiotic be sent in for use  In the meantime we must treat this as a viral symptom and manage the symptoms  You may gargle and spit lidocaine solution every 4 hours as needed for temporary relief of your sore throat  May use ibuprofen every 8 hours as needed in addition to Tylenol for additional comfort  You may follow-up at urgent care as needed    Labs pending 2-3 days, you will be contacted if positive for any sti and treatment will be sent to the pharmacy, you will have to return to the clinic if positive for gonorrhea to receive treatment   Please refrain from having sex until labs results, if positive please refrain from having sex until treatment complete and symptoms resolve   If positive for  Chlamydia  gonorrhea or trichomoniasis please notify partner or partners so they may tested as well  Moving forward, it is recommended you use some form of protection against the transmission of sti infections  such as condoms or dental dams with each sexual encounter

## 2021-06-11 NOTE — ED Provider Notes (Signed)
[place]    CSN: [id number] Arrival date & time: 06/11/21  1329      History   Chief Complaint Chief Complaint  Patient presents with   Sore Throat    HPI Denise Tanner is a 21 y.o. female.   Patient presents with sore throat, body aches and bilateral ear pain for 1 day. No known sick contatcs. Has attempted use of theraflu, somewhat helpful.  No pertinent medical history.  Denies fever, chills, nasal congestion, rhinorrhea, cough, shortness of breath, wheezing, abdominal pain, nausea, vomiting, headaches.  Concerned due to period being late by one day. Home pregnancy test negative.  Last menstrual period 05/13/2021.  Sexually active.  Requesting STD testing.  No known exposure.  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 Authorizing Provider  metroNIDAZOLE (FLAGYL) 500 MG tablet Take 1 tablet (500 mg total) by mouth 2 (two) times daily. Patient not taking: Reported on 06/11/2021 02/13/21   Shelly Bombard, MD  cetirizine (ZYRTEC) 10 MG tablet Take 1 tablet (10 mg total) by mouth daily. 02/11/19 04/04/19  Zigmund Gottron, NP  fluticasone (FLONASE) 50 MCG/ACT nasal spray Place 1 spray into both nostrils daily. 02/11/19 04/04/19  Augusto Gamble B, NP  ipratropium (ATROVENT) 0.03 % nasal spray Place 2 sprays into both nostrils 2 (two) times daily. Patient not taking: Reported on 03/25/2020  07/16/19 10/21/20  Scot Jun, FNP  medroxyPROGESTERone (DEPO-PROVERA) 150 MG/ML injection Inject 150 mg into the muscle every 3 (three) months.  11/24/18  [provider]  sodium chloride (OCEAN) 0.65 % SOLN nasal spray Place 1 spray into both nostrils as needed for congestion. And to moisturize nose 02/11/19 04/04/19  Zigmund Gottron, NP    Family History Family History  Problem Relation Age of Onset   Sickle cell anemia Mother     Social History Social History   Tobacco Use   Smoking status: Never   Smokeless tobacco: Never  Vaping Use   Vaping Use: Never used  Substance Use Topics   Alcohol use: No   Drug use: No     Allergies   Patient has no known allergies.   Review of Systems Review of Systems  Constitutional: Negative.   HENT:  Positive for ear pain and sore throat. Negative for congestion, dental problem, drooling, ear discharge, facial swelling, hearing loss, mouth sores, nosebleeds, postnasal drip, rhinorrhea, sinus pressure, sinus pain, sneezing, tinnitus, trouble swallowing and voice change.   Respiratory: Negative.    Cardiovascular: Negative.   Gastrointestinal: Negative.   Genitourinary: Negative.   Musculoskeletal:  Positive for myalgias. Negative for arthralgias, back  pain, gait problem, joint swelling, neck pain and neck stiffness.  Skin: Negative.   Neurological: Negative.     Physical Exam Triage Vital Signs ED Triage Vitals  Enc Vitals Group     BP 06/11/21 1436 111/73     Pulse Rate 06/11/21 1436 (!) 107     Resp 06/11/21 1436 16     Temp 06/11/21 1436 99 F (37.2 C)     Temp src --      SpO2 06/11/21 1436 96 %     Weight --      Height --      Head Circumference --      Peak Flow --      Pain Score 06/11/21 1433 8     Pain Loc --      Pain Edu? --      Excl. in [place]? --    No data found.  Updated Vital Signs BP 111/73 (BP Location: Left Arm)    Pulse (!) 107    Temp 99 F (37.2 C)    Resp 16    LMP 05/13/2021 (Exact  Date)    SpO2 96%    Breastfeeding Unknown   Visual Acuity Right Eye Distance:   Left Eye Distance:   Bilateral Distance:    Right Eye Near:   Left Eye Near:    Bilateral Near:     Physical Exam Constitutional:      Appearance: She is well-developed and normal weight.  HENT:     Head: Normocephalic.     Right Ear: Ear canal normal. A middle ear effusion is present.     Left Ear: Ear canal normal. A middle ear effusion is present.     Nose: No congestion or rhinorrhea.     Mouth/Throat:     Mouth: Mucous membranes are moist.     Pharynx: Posterior oropharyngeal erythema present.     Tonsils: No tonsillar exudate. 1+ on the right. 1+ on the left.  Cardiovascular:     Rate and Rhythm: Normal rate and regular rhythm.     Heart sounds: Normal heart sounds.  Pulmonary:     Effort: Pulmonary effort is normal.  Musculoskeletal:     Cervical back: Normal range of motion.  Lymphadenopathy:     Cervical: Cervical adenopathy present.  Skin:    General: Skin is warm and dry.  Neurological:     General: No focal deficit present.     Mental Status: She is alert and oriented to person, place, and time.  Psychiatric:        Mood and Affect: Mood normal.        Behavior: Behavior normal.     UC Treatments / Results  Labs (all labs ordered are listed, but only abnormal results are displayed) Labs Reviewed  POCT RAPID STREP A, ED / UC  CERVICOVAGINAL ANCILLARY ONLY    EKG   Radiology No results found.  Procedures Procedures (including critical care time)  Medications Ordered in UC Medications - No data to display  Initial Impression / Assessment and Plan / UC Course  I have reviewed the triage vital signs and the nursing notes.  Pertinent labs & imaging results that were available during my care of the patient were reviewed by me and considered in my medical decision making (see chart for details).  Viral pharyngitis Late menses  Rapid strep negative, sent for  culture, etiology of symptoms are viral, discussed with patient prescribed viscous lidocaine and ibuprofen 800  mg, prescribed zyrtec and flonase to clear drainage behind tympanic membranes.   Sti screening pending, advised abstinence until labs result and/or treatment is complete, advised condom use during all sexual encounters. UC follow up as needed   Final Clinical Impressions(s) / UC Diagnoses   Final diagnoses:  None   Discharge Instructions   None    ED Prescriptions   None    PDMP not reviewed this encounter.   Hans Eden, NP 06/11/21 1537

## 2021-06-11 NOTE — ED Triage Notes (Signed)
Sore throat, bilateral ear pain.  Pain started last night.   Period is one day late, and wants std testing

## 2021-06-12 LAB — CERVICOVAGINAL ANCILLARY ONLY
Bacterial Vaginitis (gardnerella): POSITIVE — AB
Candida Glabrata: NEGATIVE
Candida Vaginitis: POSITIVE — AB
Chlamydia: NEGATIVE
Comment: NEGATIVE
Comment: NEGATIVE
Comment: NEGATIVE
Comment: NEGATIVE
Comment: NEGATIVE
Comment: NORMAL
Neisseria Gonorrhea: POSITIVE — AB
Trichomonas: NEGATIVE

## 2021-06-13 ENCOUNTER — Ambulatory Visit (HOSPITAL_COMMUNITY)
Admission: EM | Admit: 2021-06-13 | Discharge: 2021-06-13 | Disposition: A | Discharge status: Home / Self Care | Payer: Medicaid Other | Attending: Internal Medicine | Admitting: Internal Medicine

## 2021-06-13 ENCOUNTER — Telehealth (HOSPITAL_COMMUNITY): Payer: Self-pay | Admitting: Emergency Medicine

## 2021-06-13 ENCOUNTER — Other Ambulatory Visit: Payer: Self-pay

## 2021-06-13 DIAGNOSIS — A549 Gonococcal infection, unspecified: Secondary | ICD-10-CM

## 2021-06-13 LAB — CULTURE, GROUP A STREP (THRC)

## 2021-06-13 MED ORDER — CEFTRIAXONE SODIUM 500 MG IJ SOLR
INTRAMUSCULAR | Status: AC
  Filled 2021-06-13: qty 500

## 2021-06-13 MED ORDER — FLUCONAZOLE 150 MG PO TABS: 150.0000 mg | ORAL_TABLET | Freq: Once | ORAL | 0 refills | Status: AC

## 2021-06-13 MED ORDER — CEFTRIAXONE SODIUM 500 MG IJ SOLR
500.0000 mg | Freq: Once | INTRAMUSCULAR | Status: AC
  Administered 2021-06-13: 500 mg via INTRAMUSCULAR

## 2021-06-13 MED ORDER — METRONIDAZOLE 500 MG PO TABS: 500.0000 mg | ORAL_TABLET | Freq: Two times a day (BID) | ORAL | 0 refills | Status: DC

## 2021-06-13 MED ORDER — LIDOCAINE HCL (PF) 1 % IJ SOLN
INTRAMUSCULAR | Status: AC
  Filled 2021-06-13: qty 2

## 2021-06-13 NOTE — ED Triage Notes (Signed)
Pt states told to come in for gonorrhea tx.

## 2021-06-13 NOTE — Telephone Encounter (Signed)
Per protocol, patient will need treatment with IM Rocephin 500mg  for positive Gonorrhea.  Will also need treatment with Metronidazole and Diflucan.   Patient is currently checked in to the [facility], will send meds to pharmacy on file and send mychart message.

## 2021-06-18 ENCOUNTER — Ambulatory Visit: Payer: Medicaid Other | Admitting: Obstetrics and Gynecology

## 2021-06-18 ENCOUNTER — Ambulatory Visit: Payer: Medicaid Other | Admitting: Nurse Practitioner

## 2021-06-21 ENCOUNTER — Emergency Department (HOSPITAL_COMMUNITY)
Admission: EM | Admit: 2021-06-21 | Discharge: 2021-06-21 | Disposition: A | Discharge status: Home / Self Care | Payer: Medicaid Other | Attending: Emergency Medicine | Admitting: Emergency Medicine

## 2021-06-21 ENCOUNTER — Other Ambulatory Visit: Payer: Self-pay

## 2021-06-21 ENCOUNTER — Encounter (HOSPITAL_COMMUNITY): Payer: Self-pay

## 2021-06-21 DIAGNOSIS — X58XXXA Exposure to other specified factors, initial encounter: Secondary | ICD-10-CM | POA: Diagnosis not present

## 2021-06-21 DIAGNOSIS — T162XXA Foreign body in left ear, initial encounter: Secondary | ICD-10-CM | POA: Insufficient documentation

## 2021-06-21 NOTE — ED Triage Notes (Signed)
Patient said there is a bug in her ear. She hears it buzzing on and off. Happened 2 hours ago.

## 2021-06-21 NOTE — ED Provider Notes (Addendum)
[facility]-EMERGENCY DEPT [facility] Emergency Department Provider Note MRN:  [id number]  Arrival date & time: 06/21/21     Chief Complaint   Foreign Body in Ear   History of Present Illness   Denise Tanner is a 21 y.o. year-old female presents to the ED with chief complaint of insect in ear.  She states that she got a bug in her ear.  She states that she can feel it crawling and scratching.  She reports no successful treatments PTA.    Review of Systems  Pertinent review of systems noted in HPI.    Physical Exam   Vitals:   06/21/21 0352  BP: 127/89  Pulse: 94  Resp: 16  Temp: 98.9 F (37.2 C)  SpO2: 99%    CONSTITUTIONAL:  well-appearing, NAD NEURO:  Alert and oriented x 3, CN 3-12 grossly intact EYES:  eyes equal and reactive ENT/NECK:  Supple, no stridor, there is a small spider in the left ear canal CARDIO:  normal rate PULM:  No respiratory distress,  GI/GU:  non-distended,  MSK/SPINE:  No gross deformities, no edema, moves all extremities  SKIN:  no rash, atraumatic   *Additional and/or pertinent findings included in MDM below  Diagnostic and Interventional Summary    EKG Interpretation  Date/Time:    Ventricular Rate:    PR Interval:    QRS Duration:   QT Interval:    QTC Calculation:   R Axis:     Text Interpretation:         Labs Reviewed - No data to display  No orders to display    Medications - No data to display   Procedures  /  Critical Care .Foreign Body Removal  Date/Time: 06/21/2021 4:14 AM Performed by: Roxy Horseman, PA-C Authorized by: Roxy Horseman, PA-C  Consent: Verbal consent obtained. Consent given by: patient Patient understanding: patient states understanding of the procedure being performed Patient consent: the patient's understanding of the procedure matches consent given Procedure consent: procedure consent matches procedure scheduled Relevant documents: relevant documents present and verified Test  results: test results available and properly labeled Site marked: the operative site was marked Imaging studies: imaging studies available Required items: required blood products, implants, devices, and special equipment available Time out: Immediately prior to procedure a "time out" was called to verify the correct patient, procedure, equipment, support staff and site/side marked as required. Body area: ear Location details: left ear  Sedation: Patient sedated: no  Removal mechanism: irrigation Complexity: simple 1 objects recovered. Objects recovered: spider Post-procedure assessment: foreign body removed Patient tolerance: patient tolerated the procedure well with no immediate complications   ED Course and Medical Decision Making  I have reviewed the triage vital signs, the nursing notes, and pertinent available records from the EMR.  Complexity of Problems Addressed Acute uncomplicated illness or injury with no diagnostics  Additional Data Reviewed and Analyzed Further history obtained from: None    ED Course    I witnessed the spider come out into the sink and picked it up with my glove and visualized it under the otoscope.    Final Clinical Impressions(s) / ED Diagnoses     ICD-10-CM   1. Foreign body of left ear, initial encounter  T16.2XXA       ED Discharge Orders     None        Discharge Instructions Discussed with and Provided to Patient:   Discharge Instructions   None      Roxy Horseman,  PA-C 06/21/21 0415    Roxy Horseman, PA-C 06/21/21 0416    Melene Plan, DO 06/21/21 0425

## 2021-06-25 ENCOUNTER — Ambulatory Visit (HOSPITAL_COMMUNITY)
Admission: EM | Admit: 2021-06-25 | Discharge: 2021-06-25 | Disposition: A | Discharge status: Home / Self Care | Payer: Medicaid Other

## 2021-06-25 ENCOUNTER — Encounter (HOSPITAL_COMMUNITY): Payer: Self-pay | Admitting: Emergency Medicine

## 2021-06-25 ENCOUNTER — Other Ambulatory Visit: Payer: Self-pay

## 2021-06-25 DIAGNOSIS — H9202 Otalgia, left ear: Secondary | ICD-10-CM | POA: Diagnosis not present

## 2021-06-25 NOTE — ED Notes (Signed)
Pt reports that she had left ear pain and was seen in ED 4 days ago. Reports they got a spider out of her ear but patient still c/o left ear pain.

## 2021-06-25 NOTE — ED Provider Notes (Signed)
[place]    CSN: [id number] Arrival date & time: 06/25/21  1435      History   Chief Complaint Chief Complaint  Patient presents with   Otalgia    HPI Denise Tanner is a 21 y.o. female. Pt reports having a spider in her ear last weekend. Was seen in ED and it was removed. Pt still has mild ear irritation and is concerned something is still in her ear.    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 Authorizing Provider  cetirizine (ZYRTEC ALLERGY) 10 MG tablet Take 1 tablet (10 mg total) by mouth daily. 06/11/21   White, Leitha Schuller, NP  fluticasone (FLONASE) 50 MCG/ACT nasal spray Place 1 spray into both nostrils daily. 06/11/21   White, Leitha Schuller, NP  ibuprofen (ADVIL) 800 MG tablet Take 1 tablet (800 mg total) by mouth 3 (three) times daily. 06/11/21   White, Leitha Schuller, NP  lidocaine (XYLOCAINE) 2 % solution Use as directed 15 mLs in the mouth or throat as needed for mouth pain. 06/11/21   White, Leitha Schuller, NP  metroNIDAZOLE (FLAGYL) 500 MG tablet Take 1 tablet (500 mg total) by mouth 2 (two) times daily. 06/13/21   LampteyMyrene Galas, MD  ipratropium (ATROVENT) 0.03 % nasal spray Place 2 sprays into both nostrils 2 (two) times daily. Patient not taking: Reported on 03/25/2020 07/16/19 10/21/20  Scot Jun,  FNP  medroxyPROGESTERone (DEPO-PROVERA) 150 MG/ML injection Inject 150 mg into the muscle every 3 (three) months.  11/24/18  [provider]  sodium chloride (OCEAN) 0.65 % SOLN nasal spray Place 1 spray into both nostrils as needed for congestion. And to moisturize nose 02/11/19 04/04/19  Zigmund Gottron, NP    Family History Family History  Problem Relation Age of Onset   Sickle cell anemia Mother     Social History Social History   Tobacco Use   Smoking status: Never   Smokeless tobacco: Never  Vaping Use   Vaping Use: Never used  Substance Use Topics   Alcohol use: No   Drug use: No     Allergies   Patient has no known allergies.   Review of Systems Review of Systems  Constitutional:  Negative for fever.  HENT:  Positive for ear pain. Negative for ear discharge, facial swelling and sore throat.     Physical Exam Triage Vital Signs ED Triage Vitals  Enc Vitals Group     BP 06/25/21 1536 122/74     Pulse Rate 06/25/21  1536 76     Resp 06/25/21 1536 18     Temp 06/25/21 1536 99 F (37.2 C)     Temp Source 06/25/21 1536 Oral     SpO2 06/25/21 1536 100 %     Weight --      Height --      Head Circumference --      Peak Flow --      Pain Score 06/25/21 1534 9     Pain Loc --      Pain Edu? --      Excl. in [place]? --    No data found.  Updated Vital Signs BP 122/74 (BP Location: Left Arm)    Pulse 76    Temp 99 F (37.2 C) (Oral)    Resp 18    LMP 06/17/2021    SpO2 100%   Visual Acuity Right Eye Distance:   Left Eye Distance:   Bilateral Distance:    Right Eye Near:   Left Eye Near:    Bilateral Near:     Physical Exam Constitutional:      Appearance: Normal appearance.  HENT:     Right Ear: Tympanic membrane, ear canal and external ear normal.     Left Ear: Tympanic membrane, ear canal and external ear normal.     Mouth/Throat:     Mouth: Mucous membranes are moist.     Pharynx: Oropharynx is clear.     Comments: No submandibular or  periauricular lymphadenopathy Pulmonary:     Effort: Pulmonary effort is normal.  Neurological:     Mental Status: She is alert.     UC Treatments / Results  Labs (all labs ordered are listed, but only abnormal results are displayed) Labs Reviewed - No data to display  EKG   Radiology No results found.  Procedures Procedures (including critical care time)  Medications Ordered in UC Medications - No data to display  Initial Impression / Assessment and Plan / UC Course  I have reviewed the triage vital signs and the nursing notes.  Pertinent labs & imaging results that were available during my care of the patient were reviewed by me and considered in my medical decision making (see chart for details).    Reassured pt B ears, canals, TMs appear normal and healthy  Final Clinical Impressions(s) / UC Diagnoses   Final diagnoses:  Left ear pain     Discharge Instructions      You can use ibuprofen and/or warm compresses to your ear to help it feel better.    ED Prescriptions   None    PDMP not reviewed this encounter.   Carvel Getting, NP 06/25/21 1554

## 2021-06-25 NOTE — Discharge Instructions (Signed)
You can use ibuprofen and/or warm compresses to your ear to help it feel better.

## 2021-06-28 ENCOUNTER — Ambulatory Visit (HOSPITAL_COMMUNITY)
Admission: EM | Admit: 2021-06-28 | Discharge: 2021-06-28 | Discharge status: Against Medical Advice | Payer: Medicaid Other

## 2021-06-28 ENCOUNTER — Other Ambulatory Visit: Payer: Self-pay

## 2021-06-28 NOTE — ED Notes (Signed)
Denise Tanner, patient access reports calling patient .  Reports patient responded that she left, could not wait.

## 2021-06-28 NOTE — ED Notes (Signed)
No answer in lobby.

## 2021-06-29 ENCOUNTER — Ambulatory Visit (HOSPITAL_COMMUNITY)
Admission: EM | Admit: 2021-06-29 | Discharge: 2021-06-29 | Disposition: A | Discharge status: Home / Self Care | Payer: Medicaid Other | Attending: Nurse Practitioner | Admitting: Nurse Practitioner

## 2021-06-29 ENCOUNTER — Encounter (HOSPITAL_COMMUNITY): Payer: Self-pay

## 2021-06-29 VITALS — BP 127/77 | HR 71 | Temp 98.4°F | Resp 20

## 2021-06-29 DIAGNOSIS — Z8619 Personal history of other infectious and parasitic diseases: Secondary | ICD-10-CM | POA: Diagnosis not present

## 2021-06-29 DIAGNOSIS — N3001 Acute cystitis with hematuria: Secondary | ICD-10-CM | POA: Diagnosis present

## 2021-06-29 DIAGNOSIS — Z113 Encounter for screening for infections with a predominantly sexual mode of transmission: Secondary | ICD-10-CM | POA: Diagnosis not present

## 2021-06-29 HISTORY — DX: Other specified bacterial agents as the cause of diseases classified elsewhere: N76.0

## 2021-06-29 HISTORY — DX: Acute vaginitis: B96.89

## 2021-06-29 HISTORY — DX: Gonococcal infection, unspecified: A54.9

## 2021-06-29 LAB — POCT URINALYSIS DIPSTICK, ED / UC
Bilirubin Urine: NEGATIVE
Glucose, UA: NEGATIVE mg/dL
Ketones, ur: NEGATIVE mg/dL
Nitrite: NEGATIVE
Protein, ur: NEGATIVE mg/dL
Specific Gravity, Urine: 1.02 (ref 1.005–1.030)
Urobilinogen, UA: 0.2 mg/dL (ref 0.0–1.0)
pH: 7.5 (ref 5.0–8.0)

## 2021-06-29 LAB — POC URINE PREG, ED: Preg Test, Ur: NEGATIVE

## 2021-06-29 MED ORDER — NITROFURANTOIN MONOHYD MACRO 100 MG PO CAPS: 100.0000 mg | ORAL_CAPSULE | Freq: Two times a day (BID) | ORAL | 0 refills | Status: DC

## 2021-06-29 NOTE — Discharge Instructions (Signed)
Your urine test showed possible urinary tract infection.  You will be given a prescription for antibiotics to treat this.  Make sure you take all the medications until you are finished. Testing for gonorrhea, chlamydia and trichomonas is pending. You should not have any sexual activity until you receive the results of the tests. You will only be notified for positive results. You may go online to MyChart and review your results. Practice safe sex practices by wearing a condom every time you have sex. Remember that people who have STIs may not experience any symptoms. However, even without symptoms, these infections can be spread from person to person and require treatment. STIs can be treated, and many STIs can be cured. However, some STIs cannot be cured and will affect you for the rest of your life. It's important to be checked regularly for STIs. You should also consider taking pre-exposure prophylaxis (PrEP) to prevent HIV infection.

## 2021-06-29 NOTE — ED Provider Notes (Signed)
[place]    CSN: [id number] Arrival date & time: 06/29/21  1258      History   Chief Complaint Chief Complaint  Patient presents with   STI Check   Appointment    1400    HPI Denise Tanner is a 21 y.o. female.   Subjective:   Denise Tanner is a 21 y.o. female who presents for STI re-check.  Patient was seen here on 06/11/2021 for viral pharyngitis.  During that visit, the patient also requested STD testing.  She denied any symptoms at that time or any known exposure.  Swab was subsequently positive for gonorrhea, chlamydia and trichomonas. She was notified of these results on 06/13/2021. She returned to the clinic for an a Rocephin injection and was also prescribed Flagyl and Diflucan. Patient reports finishing the prescribed medications. Her partner came in on 06/13/2021 after finding out about her positive test results and also tested positive for gonorrhea, chlamydia and trichomonas. He had delayed treatment but finished a couple of days ago. Patient admits to having intercourse once with her partner during the time of treatment but reports using a condom. Patient states that she started to have vaginal discharge, vaginal irritation and urinary frequency about 3 days ago. Symptoms started about 2 days after she shaved her private area.  She initially thought that symptoms were due to shaving. She denies any dysuria, hematuria, back pain, flank pain, nausea, vomiting, vaginal odor, abdominal pain or fevers.  Last menstrual cycle was on 06/13/2021.  She is not on birth control.  She denies having any sexual contact with anyone other than her current partner in which she has been with for the past month.   The following portions of the patient's history were reviewed and updated as appropriate: allergies, current medications, past family history, past medical history, past social history, past surgical history, and problem list.    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 Authorizing Provider  cetirizine (ZYRTEC ALLERGY) 10 MG tablet Take 1 tablet (10 mg total) by mouth daily. 06/11/21  Yes White, Adrienne R, NP  nitrofurantoin, macrocrystal-monohydrate, (MACROBID) 100 MG capsule Take 1 capsule (100 mg total) by mouth 2 (two) times daily. 06/29/21  Yes Enrique Sack, FNP  fluticasone (FLONASE) 50 MCG/ACT nasal spray Place 1 spray into both nostrils daily. 06/11/21   White, Leitha Schuller, NP  ibuprofen (ADVIL) 800 MG tablet Take 1 tablet (800 mg total) by mouth 3 (three) times daily. 06/11/21   White, Leitha Schuller, NP  lidocaine (XYLOCAINE) 2 % solution Use as directed 15 mLs in  the mouth or throat as needed for mouth pain. 06/11/21   White, Leitha Schuller, NP  ipratropium (ATROVENT) 0.03 % nasal spray Place 2 sprays into both nostrils 2 (two) times daily. Patient not taking: Reported on 03/25/2020 07/16/19 10/21/20  Scot Jun, FNP  medroxyPROGESTERone (DEPO-PROVERA) 150 MG/ML injection Inject 150 mg into the muscle every 3 (three) months.  11/24/18  [provider]  sodium chloride (OCEAN) 0.65 % SOLN nasal spray Place 1 spray into both nostrils as needed for congestion. And to moisturize nose 02/11/19 04/04/19  Zigmund Gottron, NP    Family History Family History  Problem Relation Age of Onset   Sickle cell anemia Mother     Social History Social History   Tobacco Use    Smoking status: Never   Smokeless tobacco: Never  Vaping Use   Vaping Use: Never used  Substance Use Topics   Alcohol use: No   Drug use: No     Allergies   Patient has no known allergies.   Review of Systems Review of Systems  Constitutional:  Negative for fever.  Gastrointestinal:  Negative for abdominal pain, nausea and vomiting.  Genitourinary:  Positive for frequency and vaginal discharge. Negative for dysuria and flank pain.  Musculoskeletal:  Negative for back pain.  All other systems reviewed and are negative.   Physical Exam Triage Vital Signs ED Triage Vitals  Enc Vitals Group     BP 06/29/21 1334 127/77     Pulse Rate 06/29/21 1334 71     Resp 06/29/21 1334 20     Temp 06/29/21 1334 98.4 F (36.9 C)     Temp Source 06/29/21 1334 Oral     SpO2 06/29/21 1334 97 %     Weight --      Height --      Head Circumference --      Peak Flow --      Pain Score 06/29/21 1335 0     Pain Loc --      Pain Edu? --      Excl. in [place]? --    No data found.  Updated Vital Signs BP 127/77    Pulse 71    Temp 98.4 F (36.9 C) (Oral)    Resp 20    LMP 06/19/2021 (Exact Date)    SpO2 97%    Breastfeeding No   Visual Acuity Right Eye Distance:   Left Eye Distance:   Bilateral Distance:    Right Eye Near:   Left Eye Near:    Bilateral Near:     Physical Exam Vitals reviewed.  Constitutional:      General: She is not in acute distress.    Appearance: Normal appearance. She is not ill-appearing, toxic-appearing or diaphoretic.  HENT:     Head: Normocephalic.  Cardiovascular:     Rate and Rhythm: Normal rate.  Pulmonary:     Effort: Pulmonary effort is normal.  Abdominal:     Palpations: Abdomen is soft.     Tenderness: There is no abdominal tenderness. There is no right CVA tenderness or left CVA tenderness.  Genitourinary:    Comments: Deferred. Patient performed self swab for testing  Musculoskeletal:        General: Normal range of motion.     Cervical  back: Normal range of motion.  Skin:    General: Skin is warm and dry.  Neurological:     General: No focal deficit present.     Mental  Status: She is alert and oriented to person, place, and time.     UC Treatments / Results  Labs (all labs ordered are listed, but only abnormal results are displayed) Labs Reviewed  POCT URINALYSIS DIPSTICK, ED / UC - Abnormal; Notable for the following components:      Result Value   Hgb urine dipstick TRACE (*)    Leukocytes,Ua TRACE (*)    All other components within normal limits  POC URINE PREG, ED  CERVICOVAGINAL ANCILLARY ONLY    EKG   Radiology No results found.  Procedures Procedures (including critical care time)  Medications Ordered in UC Medications - No data to display  Initial Impression / Assessment and Plan / UC Course  I have reviewed the triage vital signs and the nursing notes.  Pertinent labs & imaging results that were available during my care of the patient were reviewed by me and considered in my medical decision making (see chart for details).    21 yo female presenting with vaginal discharge, vaginal irritation and urinary frequency for the past 3 days.  She recently tested positive for gonorrhea, chlamydia and trichomonas 2 weeks ago.  Her partner also tested positive for the same.  She and her partner completed treatment but has had protected intercourse during the time of treatment.  Urinalysis shows trace leukocytes and blood.  Urine pregnancy negative.  Cervicovaginal swab and urine cultures pending.  Macrobid twice daily for 5 days prescribed.  Will hold off on any other treatment pending results of cervical vaginal swab. Scientist, clinical (histocompatibility and immunogenetics) distributed. Abstinence from intercourse discussed. Partner notification discussed. Discussed safe sex.  Today's evaluation has revealed no signs of a dangerous process. Discussed diagnosis with patient and/or guardian. Patient and/or guardian aware of their diagnosis,  possible red flag symptoms to watch out for and need for close follow up. Patient and/or guardian understands verbal and written discharge instructions. Patient and/or guardian comfortable with plan and disposition.  Patient and/or guardian has a clear mental status at this time, good insight into illness (after discussion and teaching) and has clear judgment to make decisions regarding their care  This care was provided during an unprecedented National Emergency due to the Novel Coronavirus (COVID-19) pandemic. COVID-19 infections and transmission risks place heavy strains on healthcare resources.  As this pandemic evolves, our facility, providers, and staff strive to respond fluidly, to remain operational, and to provide care relative to available resources and information. Outcomes are unpredictable and treatments are without well-defined guidelines. Further, the impact of COVID-19 on all aspects of urgent care, including the impact to patients seeking care for reasons other than COVID-19, is unavoidable during this national emergency. At this time of the global pandemic, management of patients has significantly changed, even for non-COVID positive patients given high local and regional COVID volumes at this time requiring high healthcare system and resource utilization. The standard of care for management of both COVID suspected and non-COVID suspected patients continues to change rapidly at the local, regional, national, and global levels. This patient was worked up and treated to the best available but ever changing evidence and resources available at this current time.   Documentation was completed with the aid of voice recognition software. Transcription may contain typographical errors.   Final Clinical Impressions(s) / UC Diagnoses   Final diagnoses:  Acute cystitis with hematuria  History of sexually transmitted disease  Screen for STD (sexually transmitted disease)     Discharge  Instructions      Your  urine test showed possible urinary tract infection.  You will be given a prescription for antibiotics to treat this.  Make sure you take all the medications until you are finished. Testing for gonorrhea, chlamydia and trichomonas is pending. You should not have any sexual activity until you receive the results of the tests. You will only be notified for positive results. You may go online to [place] and review your results. Practice safe sex practices by wearing a condom every time you have sex. Remember that people who have STIs may not experience any symptoms. However, even without symptoms, these infections can be spread from person to person and require treatment. STIs can be treated, and many STIs can be cured. However, some STIs cannot be cured and will affect you for the rest of your life. It's important to be checked regularly for STIs. You should also consider taking pre-exposure prophylaxis (PrEP) to prevent HIV infection.     ED Prescriptions     Medication Sig Dispense Auth. Provider   nitrofurantoin, macrocrystal-monohydrate, (MACROBID) 100 MG capsule Take 1 capsule (100 mg total) by mouth 2 (two) times daily. 10 capsule Enrique Sack, FNP      PDMP not reviewed this encounter.   Enrique Sack, [place] 06/30/21 1012

## 2021-06-29 NOTE — ED Triage Notes (Signed)
Pt reports being seen and treated for gonorrhea, candida, and BV approx 2 wks ago. States sxs have gone away, but pt wishes to be tested again "to make sure". Denies any sxs at this time. States had to take second Diflucan pill 3 days ago because she felt she was starting with yeast infection again, but no longer having discharge.

## 2021-06-30 LAB — CERVICOVAGINAL ANCILLARY ONLY
Bacterial Vaginitis (gardnerella): NEGATIVE
Candida Glabrata: NEGATIVE
Candida Vaginitis: NEGATIVE
Chlamydia: NEGATIVE
Comment: NEGATIVE
Comment: NEGATIVE
Comment: NEGATIVE
Comment: NEGATIVE
Comment: NEGATIVE
Comment: NORMAL
Neisseria Gonorrhea: NEGATIVE
Trichomonas: NEGATIVE

## 2021-07-11 ENCOUNTER — Ambulatory Visit: Payer: Medicaid Other

## 2021-07-14 ENCOUNTER — Ambulatory Visit (INDEPENDENT_AMBULATORY_CARE_PROVIDER_SITE_OTHER): Payer: Medicaid Other | Admitting: *Deleted

## 2021-07-14 ENCOUNTER — Other Ambulatory Visit: Payer: Self-pay

## 2021-07-14 ENCOUNTER — Other Ambulatory Visit (HOSPITAL_COMMUNITY)
Admission: RE | Admit: 2021-07-14 | Discharge: 2021-07-14 | Disposition: A | Discharge status: Home / Self Care | Payer: Medicaid Other | Source: Ambulatory Visit | Attending: Obstetrics and Gynecology | Admitting: Obstetrics and Gynecology

## 2021-07-14 VITALS — BP 128/87 | HR 96

## 2021-07-14 DIAGNOSIS — Z3202 Encounter for pregnancy test, result negative: Secondary | ICD-10-CM | POA: Diagnosis not present

## 2021-07-14 DIAGNOSIS — Z113 Encounter for screening for infections with a predominantly sexual mode of transmission: Secondary | ICD-10-CM | POA: Insufficient documentation

## 2021-07-14 LAB — POCT URINE PREGNANCY: Preg Test, Ur: NEGATIVE

## 2021-07-14 NOTE — Progress Notes (Signed)
SUBJECTIVE:  21 y.o. female who desires a STI screen. Denies abnormal vaginal discharge, or significant pelvic pain. No UTI symptoms. Denies history of known exposure to STD.  LMP Dec 2022  OBJECTIVE:  She appears well.   ASSESSMENT:  STI Screen   PLAN:  Pt offered STI blood screening-declined GC, chlamydia, and trichomonas probe sent to lab.  Treatment: To be determined once lab results are received.  Pt follow up as needed.   Denise Tanner presents today for UPT. She complains of vaginal bleeding. LMP: Mid December    OBJECTIVE: Appears well, in no apparent distress.  OB History     Gravida  2   Para  0   Term  0   Preterm  0   AB  1   Living  0      SAB  1   IAB  0   Ectopic  0   Multiple  0   Live Births             Home UPT Result: Positive In-Office UPT result: Negative I have reviewed the patient's medical, obstetrical, social, and family histories, and medications.   ASSESSMENT: Negative pregnancy test. SAB likely. Vital signs stable. No acute distress or signs of acute blood loss.  PLAN Follow up in the office with MD to discuss repeat SAB. Report to [facility] for acute blood loss, dizziness, or other emergent symptoms.

## 2021-07-16 LAB — CERVICOVAGINAL ANCILLARY ONLY
Chlamydia: NEGATIVE
Comment: NEGATIVE
Comment: NEGATIVE
Comment: NORMAL
Neisseria Gonorrhea: NEGATIVE
Trichomonas: NEGATIVE

## 2021-07-23 ENCOUNTER — Other Ambulatory Visit (HOSPITAL_COMMUNITY)
Admission: RE | Admit: 2021-07-23 | Discharge: 2021-07-23 | Disposition: A | Discharge status: Home / Self Care | Payer: Medicaid Other | Source: Ambulatory Visit | Attending: Obstetrics & Gynecology | Admitting: Obstetrics & Gynecology

## 2021-07-23 ENCOUNTER — Other Ambulatory Visit: Payer: Self-pay

## 2021-07-23 ENCOUNTER — Encounter: Payer: Self-pay | Admitting: Obstetrics & Gynecology

## 2021-07-23 ENCOUNTER — Ambulatory Visit (INDEPENDENT_AMBULATORY_CARE_PROVIDER_SITE_OTHER): Payer: Medicaid Other | Admitting: Obstetrics & Gynecology

## 2021-07-23 VITALS — BP 117/74 | HR 94 | Ht 65.0 in | Wt 154.0 lb

## 2021-07-23 DIAGNOSIS — Z01419 Encounter for gynecological examination (general) (routine) without abnormal findings: Secondary | ICD-10-CM | POA: Insufficient documentation

## 2021-07-23 DIAGNOSIS — Z8759 Personal history of other complications of pregnancy, childbirth and the puerperium: Secondary | ICD-10-CM | POA: Diagnosis not present

## 2021-07-23 DIAGNOSIS — Z8619 Personal history of other infectious and parasitic diseases: Secondary | ICD-10-CM | POA: Diagnosis not present

## 2021-07-23 NOTE — Progress Notes (Signed)
Patient ID: Denise Tanner, female   DOB: June 22, 2000, 22 y.o.   MRN: [id number]  Chief Complaint  Patient presents with   Miscarriage   She had a positive UPT at home in January then had bleeding HPI Denise Tanner is a 21 y.o. female.  [id number] who reports a home UPT positive with a negative test in the office. She had negative TOC for GC and her partner was treated. She would like to conceive. The only confirmed pregnancy was SAB in July 2022.  HPI  Past Medical History:  Diagnosis Date   BV (bacterial vaginosis)    Gonorrhea     Past Surgical History:  Procedure Laterality Date   NO PAST SURGERIES      Family History  Problem Relation Age of Onset   Sickle cell anemia Mother     Social History Social History   Tobacco Use   Smoking status: Never   Smokeless tobacco: Never  Vaping Use   Vaping Use: Never used  Substance Use Topics   Alcohol use: No   Drug use: No    No Known Allergies  Current Outpatient Medications  Medication Sig Dispense Refill   cetirizine (ZYRTEC ALLERGY) 10 MG tablet Take 1 tablet (10 mg total) by mouth daily. 30 tablet 0   fluticasone (FLONASE) 50 MCG/ACT nasal spray Place 1 spray into both nostrils daily. 11.1 mL 1   ibuprofen (ADVIL) 800 MG tablet Take 1 tablet (800 mg total) by mouth 3 (three) times daily. 21 tablet 0   lidocaine (XYLOCAINE) 2 % solution Use as directed 15 mLs in the mouth or throat as needed for mouth pain. 100 mL 0   nitrofurantoin, macrocrystal-monohydrate, (MACROBID) 100 MG capsule Take 1 capsule (100 mg total) by mouth 2 (two) times daily. 10 capsule 0   No current facility-administered medications for this visit.    Review of Systems Review of Systems  Constitutional: Negative.   Respiratory: Negative.    Cardiovascular: Negative.   Gastrointestinal: Negative.   Genitourinary:  Negative for menstrual problem, pelvic pain, vaginal bleeding and vaginal discharge.  Psychiatric/Behavioral:  The patient is  nervous/anxious.    Blood pressure 117/74, pulse 94, height 5\' 5"  (1.651 m), weight 154 lb (69.9 kg), last menstrual period 05/19/2021.  Physical Exam Physical Exam Vitals and nursing note reviewed. Exam conducted with a chaperone present.  Constitutional:      Appearance: Normal appearance.  HENT:     Head: Normocephalic and atraumatic.  Cardiovascular:     Rate and Rhythm: Regular rhythm.     Heart sounds: Normal heart sounds.  Pulmonary:     Effort: Pulmonary effort is normal.     Breath sounds: Normal breath sounds.  Chest:  Breasts:    Right: Normal.     Left: Normal.  Abdominal:     General: Abdomen is flat.     Palpations: Abdomen is soft.  Genitourinary:    General: Normal vulva.     Exam position: Lithotomy position.     Vagina: Normal.     Cervix: Normal.  Musculoskeletal:        General: Normal range of motion.     Cervical back: Normal range of motion.  Neurological:     Mental Status: She is alert.  Psychiatric:        Mood and Affect: Mood normal.        Behavior: Behavior normal.    Data Reviewed Pregnancy tests, STD testing  Assessment Well woman exam with  routine gynecological exam - Plan: Cytology - PAP( [facility])  History of miscarriage S/P GC and treatment  Plan Come for early care if she has positive UPT Safe sex for STD prevention    Scheryl Darter 07/23/2021, 2:57 PM

## 2021-07-23 NOTE — Progress Notes (Signed)
GYN presents for SAB FU. Pt is due for a PAP

## 2021-07-24 LAB — CYTOLOGY - PAP: Diagnosis: NEGATIVE

## 2021-08-08 IMAGING — US US OB TRANSVAGINAL
1 series · 15 of 28 positions shown · non-contrast
Comparison: 12/30/2020.

CLINICAL DATA: History of bleeding.

EXAM:
TRANSVAGINAL OB ULTRASOUND
TECHNIQUE: Transvaginal ultrasound was performed for complete evaluation of the
gestation as well as the maternal uterus, adnexal regions, and
pelvic cul-de-sac.

[Series 1: us ob transvaginal · 37 acquisitions, 15 frames shown]
[im 1/37]
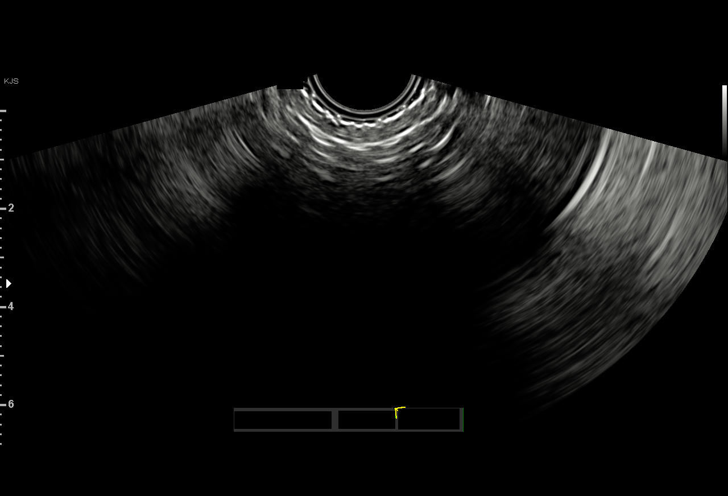
[im 3/37]
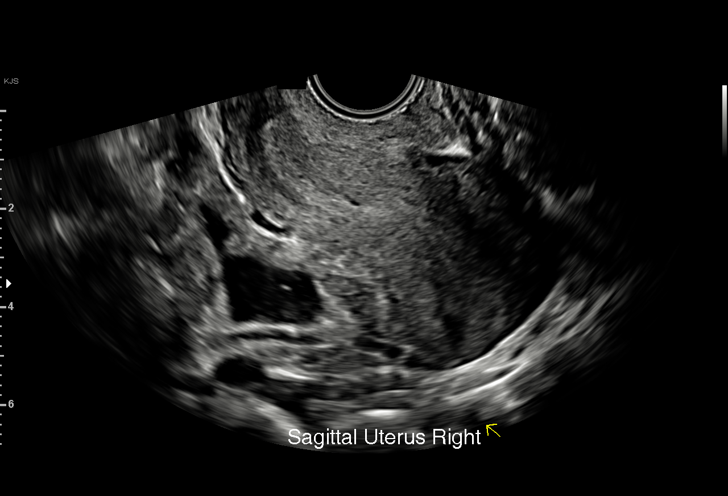
[im 6/37]
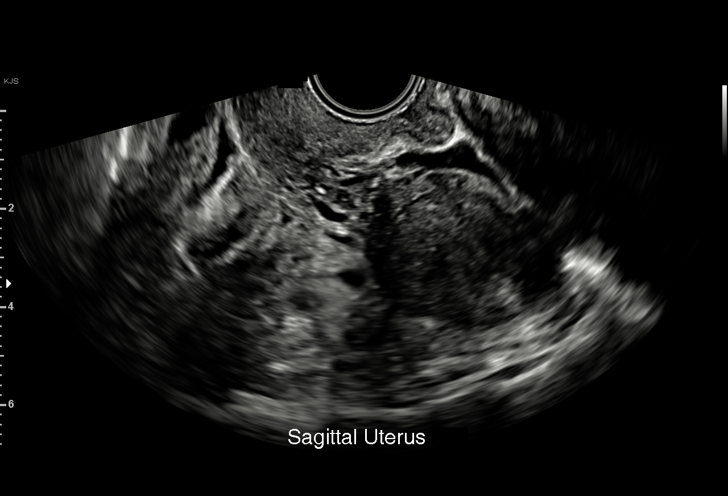
[im 9/37]
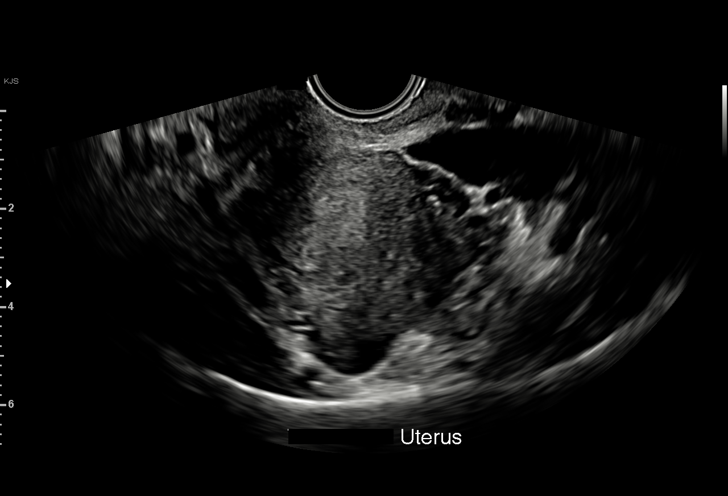
[im 11/37]
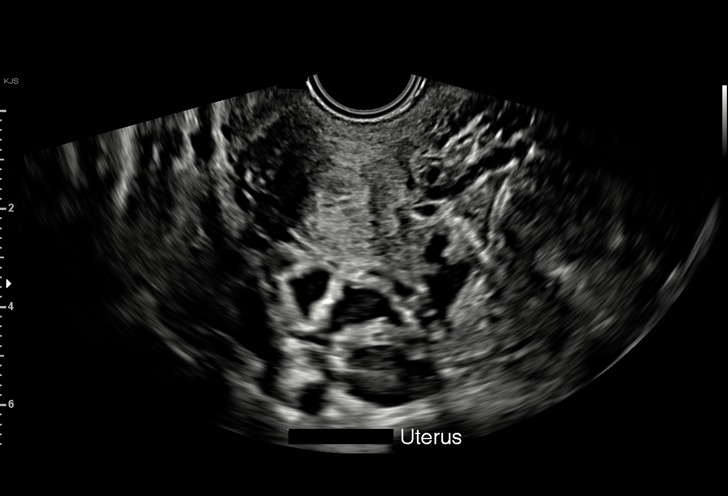
[im 14/37]
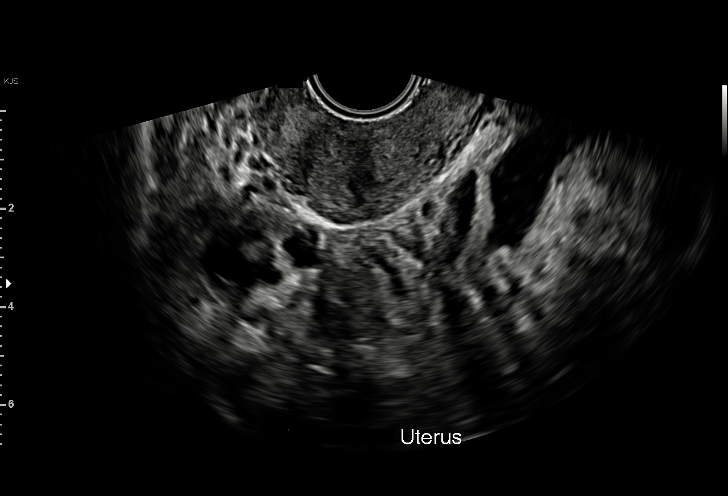
[im 17/37]
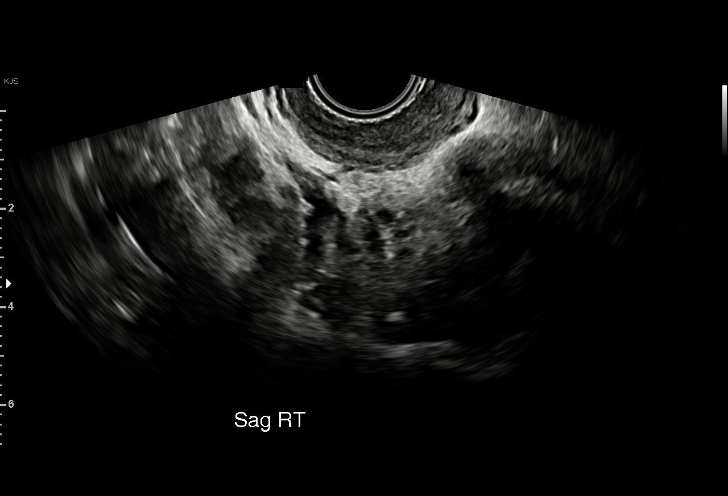
[im 19/37]
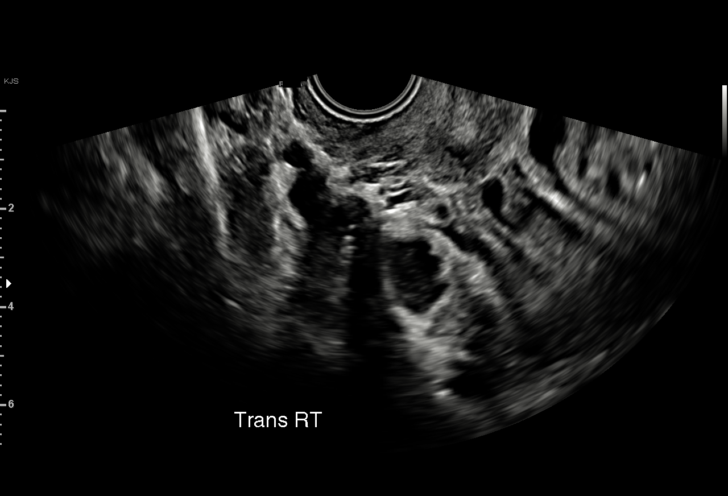
[im 21/37]
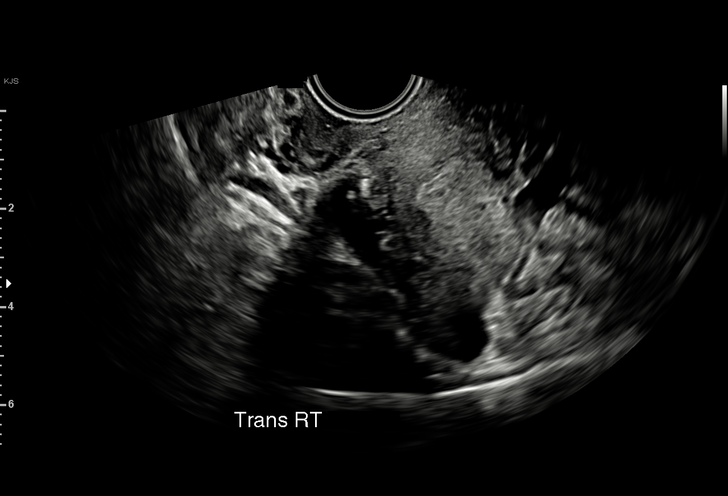
[im 23/37]
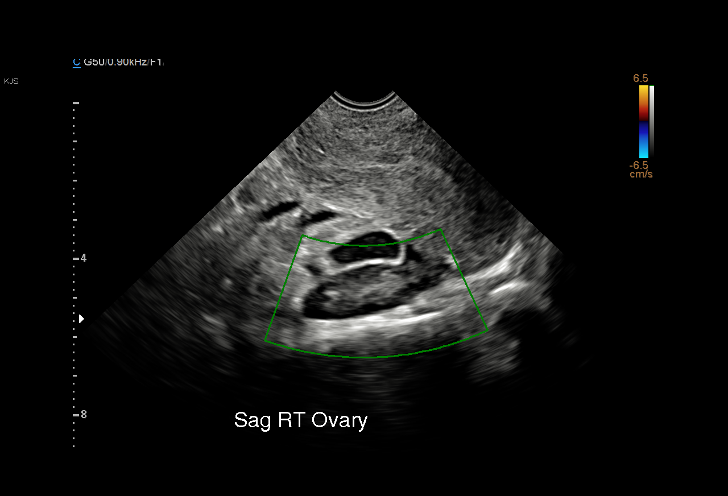
[im 26/37]
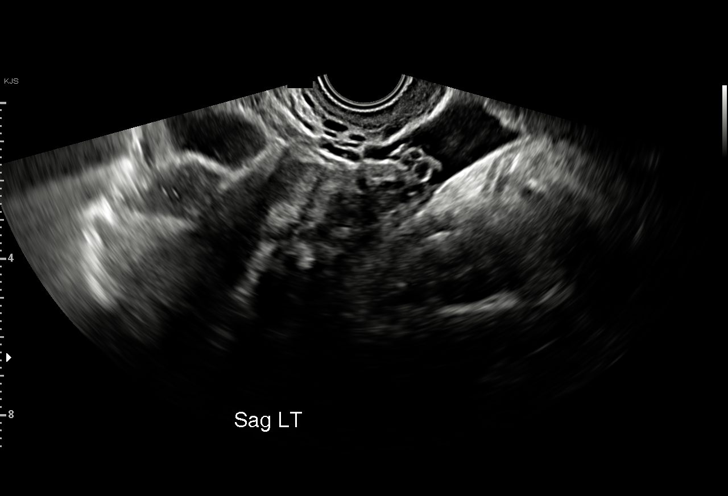
[im 29/37]
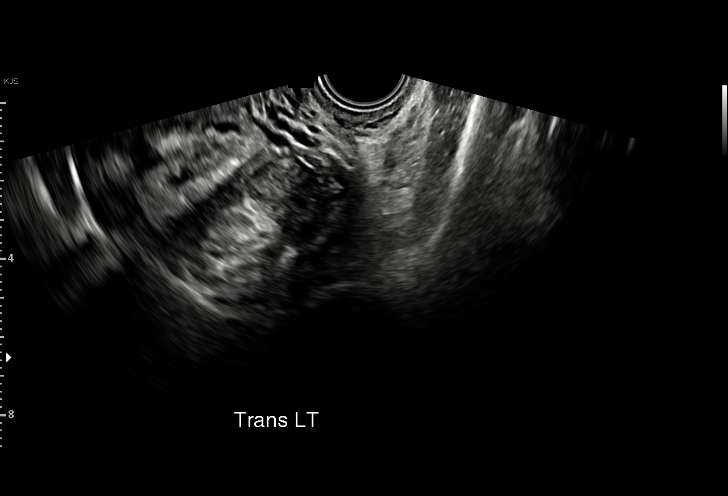
[im 31/37]
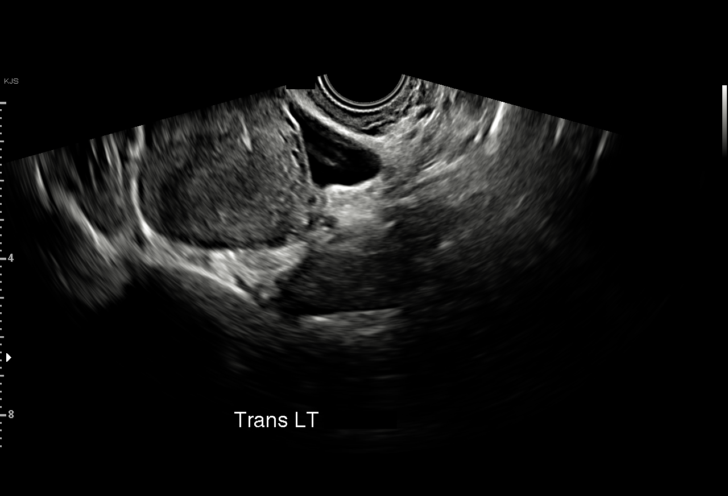
[im 34/37]
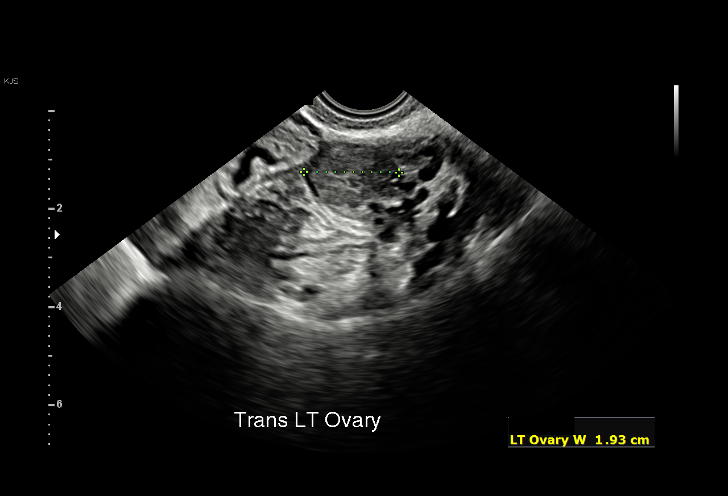
[im 37/37]
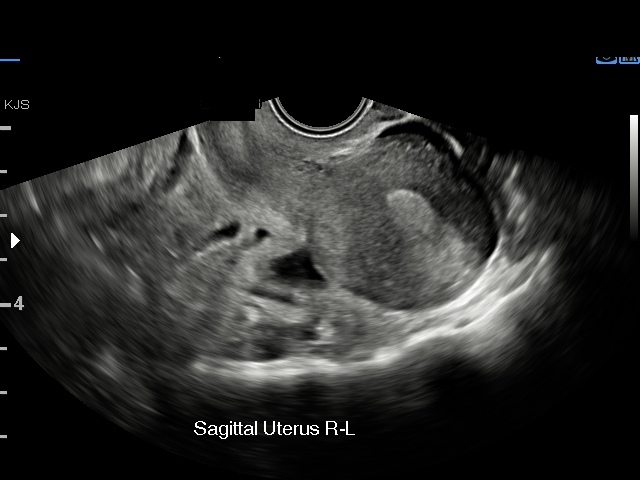

[15 of 28 positions shown; findings below may reference images not displayed]

FINDINGS: Intrauterine gestational sac: None visualized

Yolk sac:  None visualized

Embryo:  None visualized

Cardiac Activity: None visualized

Subchorionic hemorrhage:  None visualized

Maternal uterus/adnexae: Unremarkable. Small amount of free pelvic
fluid.
IMPRESSION: No intrauterine pregnancy identified. As noted on prior study of
12/30/2020 close clinical follow-up and serial quantitative beta
HCGs/ultrasounds should be considered. Small amount of free pelvic
fluid noted.

## 2021-08-11 ENCOUNTER — Ambulatory Visit: Payer: Medicaid Other

## 2021-08-14 ENCOUNTER — Ambulatory Visit: Payer: Medicaid Other

## 2021-08-14 ENCOUNTER — Encounter (HOSPITAL_COMMUNITY): Payer: Self-pay | Admitting: Obstetrics and Gynecology

## 2021-08-14 ENCOUNTER — Inpatient Hospital Stay (HOSPITAL_COMMUNITY)
Admission: AD | Admit: 2021-08-14 | Discharge: 2021-08-14 | Disposition: A | Discharge status: Home / Self Care | Payer: Medicaid Other | Attending: Obstetrics and Gynecology | Admitting: Obstetrics and Gynecology

## 2021-08-14 ENCOUNTER — Telehealth: Payer: Self-pay

## 2021-08-14 ENCOUNTER — Other Ambulatory Visit: Payer: Self-pay

## 2021-08-14 ENCOUNTER — Inpatient Hospital Stay (HOSPITAL_COMMUNITY): Payer: Medicaid Other

## 2021-08-14 DIAGNOSIS — O09291 Supervision of pregnancy with other poor reproductive or obstetric history, first trimester: Secondary | ICD-10-CM | POA: Insufficient documentation

## 2021-08-14 DIAGNOSIS — O26891 Other specified pregnancy related conditions, first trimester: Secondary | ICD-10-CM | POA: Insufficient documentation

## 2021-08-14 DIAGNOSIS — O99891 Other specified diseases and conditions complicating pregnancy: Secondary | ICD-10-CM

## 2021-08-14 DIAGNOSIS — O3680X0 Pregnancy with inconclusive fetal viability, not applicable or unspecified: Secondary | ICD-10-CM | POA: Insufficient documentation

## 2021-08-14 DIAGNOSIS — Z3A01 Less than 8 weeks gestation of pregnancy: Secondary | ICD-10-CM | POA: Insufficient documentation

## 2021-08-14 DIAGNOSIS — R109 Unspecified abdominal pain: Secondary | ICD-10-CM | POA: Insufficient documentation

## 2021-08-14 DIAGNOSIS — M545 Low back pain, unspecified: Secondary | ICD-10-CM | POA: Insufficient documentation

## 2021-08-14 DIAGNOSIS — M549 Dorsalgia, unspecified: Secondary | ICD-10-CM

## 2021-08-14 HISTORY — DX: Urinary tract infection, site not specified: N39.0

## 2021-08-14 LAB — CBC
HCT: 38.5 % (ref 36.0–46.0)
Hemoglobin: 12.8 g/dL (ref 12.0–15.0)
MCH: 27.5 pg (ref 26.0–34.0)
MCHC: 33.2 g/dL (ref 30.0–36.0)
MCV: 82.8 fL (ref 80.0–100.0)
Platelets: 289 10*3/uL (ref 150–400)
RBC: 4.65 MIL/uL (ref 3.87–5.11)
RDW: 13.2 % (ref 11.5–15.5)
WBC: 6.7 10*3/uL (ref 4.0–10.5)
nRBC: 0 % (ref 0.0–0.2)

## 2021-08-14 LAB — URINALYSIS, ROUTINE W REFLEX MICROSCOPIC
Bilirubin Urine: NEGATIVE
Glucose, UA: NEGATIVE mg/dL
Hgb urine dipstick: NEGATIVE
Ketones, ur: NEGATIVE mg/dL
Leukocytes,Ua: NEGATIVE
Nitrite: NEGATIVE
Protein, ur: NEGATIVE mg/dL
Specific Gravity, Urine: 1.02 (ref 1.005–1.030)
pH: 6 (ref 5.0–8.0)

## 2021-08-14 LAB — POCT PREGNANCY, URINE: Preg Test, Ur: POSITIVE — AB

## 2021-08-14 LAB — HCG, QUANTITATIVE, PREGNANCY: hCG, Beta Chain, Quant, S: 4568 m[IU]/mL — ABNORMAL HIGH (ref <5)

## 2021-08-14 LAB — WET PREP, GENITAL
Sperm: NONE SEEN
Trich, Wet Prep: NONE SEEN
WBC, Wet Prep HPF POC: <10 (ref <10)
Yeast Wet Prep HPF POC: NONE SEEN

## 2021-08-14 LAB — ABO/RH: ABO/RH(D): O POS

## 2021-08-14 LAB — HIV ANTIBODY (ROUTINE TESTING W REFLEX): HIV Screen 4th Generation wRfx: NONREACTIVE

## 2021-08-14 NOTE — MAU Provider Note (Addendum)
History     CSN: [id number]  Arrival date and time: 08/14/21 1439   Event Date/Time   First Provider Initiated Contact with Patient 08/14/21 1559      Chief Complaint  Patient presents with   Back Pain   Possible Pregnancy   Denise FormZachria is a G3P0020 female at 2243w0d dated by LMP. She has a history of miscarriage, so when she felt some low back pain on her left side earlier today she decided to play it safe. She denies dysuria, fever, urgency, frequency, nausea, and vomiting. Denies abdominal pain and tenderness. She states she had some constipation followed by some diarrhea that quickly resolved today as well. She is happy to be newly pregnant and hopeful that it will stick this time.   Back Pain Pertinent negatives include no abdominal pain, dysuria, fever or pelvic pain.  Possible Pregnancy Pertinent negatives include no abdominal pain, fever, nausea or vomiting.   OB History     Gravida  3   Para  0   Term  0   Preterm  0   AB  2   Living  0      SAB  1   IAB  0   Ectopic  0   Multiple  0   Live Births              Past Medical History:  Diagnosis Date   BV (bacterial vaginosis)    Gonorrhea    UTI (urinary tract infection)     Past Surgical History:  Procedure Laterality Date   NO PAST SURGERIES      Family History  Problem Relation Age of Onset   Sickle cell anemia Mother    Hypothyroidism Mother    Healthy Father        was killed    Social History   Tobacco Use   Smoking status: Never   Smokeless tobacco: Never  Vaping Use   Vaping Use: Never used  Substance Use Topics   Alcohol use: No   Drug use: No    Allergies: No Known Allergies  Medications Prior to Admission  Medication Sig Dispense Refill Last Dose   Prenatal Vit-Fe Fumarate-FA (MULTIVITAMIN-PRENATAL) 27-0.8 MG TABS tablet Take 1 tablet by mouth daily at 12 noon.   08/14/2021 at t   cetirizine (ZYRTEC ALLERGY) 10 MG tablet Take 1 tablet (10 mg total) by mouth daily. 30  tablet 0    fluticasone (FLONASE) 50 MCG/ACT nasal spray Place 1 spray into both nostrils daily. 11.1 mL 1    ibuprofen (ADVIL) 800 MG tablet Take 1 tablet (800 mg total) by mouth 3 (three) times daily. 21 tablet 0    lidocaine (XYLOCAINE) 2 % solution Use as directed 15 mLs in the mouth or throat as needed for mouth pain. 100 mL 0    nitrofurantoin, macrocrystal-monohydrate, (MACROBID) 100 MG capsule Take 1 capsule (100 mg total) by mouth 2 (two) times daily. 10 capsule 0     Review of Systems  Constitutional:  Negative for fever.  Gastrointestinal:  Negative for abdominal pain, blood in stool, constipation, diarrhea, nausea and vomiting.  Genitourinary:  Negative for dysuria, flank pain, frequency, hematuria, menstrual problem, pelvic pain, urgency, vaginal bleeding, vaginal discharge and vaginal pain.  Musculoskeletal:  Positive for back pain.  Physical Exam   Blood pressure 117/66, pulse 97, temperature 99.1 F (37.3 C), temperature source Oral, resp. rate 16, height 5\' 4"  (1.626 m), weight 70.8 kg, last menstrual period 06/19/2021, SpO2 97 %.  Physical Exam Constitutional:      Appearance: Normal appearance.  Abdominal:     Tenderness: There is no abdominal tenderness. There is no right CVA tenderness, left CVA tenderness or guarding.  Genitourinary:    Vagina: No vaginal discharge.  Musculoskeletal:        General: No tenderness.  Neurological:     Mental Status: She is alert.   Patient Vitals for the past 24 hrs:  BP Temp Temp src Pulse Resp SpO2 Height Weight  08/14/21 1815 (!) 104/59 -- -- 85 16 -- -- --  08/14/21 1519 117/66 99.1 F (37.3 C) Oral 97 16 97 % 5\' 4"  (1.626 m) 70.8 kg    OB LESS THAN 14 WEEKS WITH OB TRANSVAGINAL  Result Date: 08/14/2021 CLINICAL DATA:  Abdomen pain EXAM: OBSTETRIC <14 WK 10/14/2021 AND TRANSVAGINAL OB US TECHNIQUE: Both transabdominal and transvaginal ultrasound examinations were performed for complete evaluation of the gestation as well as the  maternal uterus, adnexal regions, and pelvic cul-de-sac. Transvaginal technique was performed to assess early pregnancy. COMPARISON:  None. FINDINGS: Intrauterine gestational sac: Probable single intrauterine gestational sac. Yolk sac:  Not visualized Embryo:  Not visualized MSD: 6.2 mm mm   5 w   2 d Subchorionic hemorrhage:  None visualized. Maternal uterus/adnexae: Ovaries are within normal limits. Right ovary measures 3.8 x 1.6 x 1.8 cm. Left ovary measures 4.3 x 2.9 x 3.2 cm and contains corpus luteum. Trace free fluid IMPRESSION: Probable early intrauterine gestational sac, but no yolk sac, fetal pole, or cardiac activity yet visualized. Recommend follow-up quantitative B-HCG levels and follow-up US in 14 days to confirm and assess viability. This recommendation follows SRU consensus guidelines: Diagnostic Criteria for Nonviable Pregnancy Early in the First Trimester. [place] Med 20132014. Electronically Signed   By: ; [id number] M.D.   On: 08/14/2021 17:56     MAU Course  Procedures  MDM Transvaginal ultrasound to confirm intrauterine pregnancy. Beta HCG to trend pregnancy. Reassessment (7:21 PM)  Ultrasound shows a gestational sac but no yolk sac. Beta HCG is 4568.  Assessment and Plan   Back pain affecting pregnancy in first trimester - Plan: Discharge patient, 10/14/2021 OB Transvaginal  Abdominal pain during pregnancy in first trimester - Plan: US OB LESS THAN 14 WEEKS WITH OB TRANSVAGINAL, US OB LESS THAN 14 WEEKS WITH OB TRANSVAGINAL  [redacted] weeks gestation of pregnancy - Plan: Discharge patient, US OB Transvaginal  Pregnancy of unknown anatomic location - Plan: Discharge patient, US OB Transvaginal   US 08/14/2021, 4:08 PM   CNM attestation:  I have seen and examined this patient and agree with above documentation in the PA student's note.   Denise Tanner is a 21 y.o. G3P0020 at [redacted]w[redacted]d by LMP reporting pain in her left lower back. Denies VB, abdominal pain, vaginal  discharge.  PE: Patient Vitals for the past 24 hrs:  BP Temp Temp src Pulse Resp SpO2 Height Weight  08/14/21 1815 (!) 104/59 -- -- 85 16 -- -- --  08/14/21 1519 117/66 99.1 F (37.3 C) Oral 97 16 97 % 5\' 4"  (1.626 m) 156 lb (70.8 kg)   Gen: calm comfortable, NAD Resp: normal effort, no distress Heart: Regular rate Abd: Soft, NT, gravid, S=D   ROS, labs, PMH reviewed  Orders Placed This Encounter  Procedures   Wet prep, genital   10/14/21 OB LESS THAN 14 WEEKS WITH OB TRANSVAGINAL   OB Transvaginal   Urinalysis, Routine w reflex  microscopic   CBC   hCG, quantitative, pregnancy   HIV Antibody (routine testing w rflx)   Pregnancy, urine POC   ABO/Rh   Discharge patient   No orders of the defined types were placed in this encounter.  Results for orders placed or performed during the hospital encounter of 08/14/21 (from the past 24 hour(s))  Pregnancy, urine POC     Status: Abnormal   Collection Time: 08/14/21  2:56 PM  Result Value Ref Range   Preg Test, Ur POSITIVE (A) NEGATIVE  Urinalysis, Routine w reflex microscopic Urine, Clean Catch     Status: Abnormal   Collection Time: 08/14/21  2:57 PM  Result Value Ref Range   Color, Urine YELLOW YELLOW   APPearance HAZY (A) CLEAR   Specific Gravity, Urine 1.020 1.005 - 1.030   pH 6.0 5.0 - 8.0   Glucose, UA NEGATIVE NEGATIVE mg/dL   Hgb urine dipstick NEGATIVE NEGATIVE   Bilirubin Urine NEGATIVE NEGATIVE   Ketones, ur NEGATIVE NEGATIVE mg/dL   Protein, ur NEGATIVE NEGATIVE mg/dL   Nitrite NEGATIVE NEGATIVE   Leukocytes,Ua NEGATIVE NEGATIVE  CBC     Status: None   Collection Time: 08/14/21  3:51 PM  Result Value Ref Range   WBC 6.7 4.0 - 10.5 K/uL   RBC 4.65 3.87 - 5.11 MIL/uL   Hemoglobin 12.8 12.0 - 15.0 g/dL   HCT 16.1 09.6 - 04.5 %   MCV 82.8 80.0 - 100.0 fL   MCH 27.5 26.0 - 34.0 pg   MCHC 33.2 30.0 - 36.0 g/dL   RDW 40.9 81.1 - 91.4 %   Platelets 289 150 - 400 K/uL   nRBC 0.0 0.0 - 0.2 %  hCG, quantitative,  pregnancy     Status: Abnormal   Collection Time: 08/14/21  3:51 PM  Result Value Ref Range   hCG, Beta Chain, Quant, S 4,568 (H) <5 mIU/mL  HIV Antibody (routine testing w rflx)     Status: None   Collection Time: 08/14/21  3:51 PM  Result Value Ref Range   HIV Screen 4th Generation wRfx Non Reactive Non Reactive  ABO/Rh     Status: None   Collection Time: 08/14/21  3:51 PM  Result Value Ref Range   ABO/RH(D) O POS    No rh immune globuloin      NOT A RH IMMUNE GLOBULIN CANDIDATE, PT RH POSITIVE Performed at [facility], [address], [place], [place] [address]   Wet prep, genital     Status: Abnormal   Collection Time: 08/14/21  4:12 PM   Specimen: Vaginal  Result Value Ref Range   Yeast Wet Prep HPF POC NONE SEEN NONE SEEN   Trich, Wet Prep NONE SEEN NONE SEEN   Clue Cells Wet Prep HPF POC PRESENT (A) NONE SEEN   WBC, Wet Prep HPF POC <10 <10   Sperm NONE SEEN      MDM Hcg 4568 with GS but no YS on [place]. Findings today could represent a normal early pregnancy, spontaneous abortion or ectopic pregnancy which can be life-threatening.  Consult Dr Crissie Reese given high hcg without evidence of IUP. D/C home with outpatient [place] in 7 days.  Return to MAU as needed for emergencies. Ectopic precautions given.  Assessment: 1. Back pain affecting pregnancy in first trimester   2. Abdominal pain during pregnancy in first trimester   3. [redacted] weeks gestation of pregnancy   4. Pregnancy of unknown anatomic location  Plan: - Discharge home in stable condition  Kendell Bane 08/14/2021 9:59 PM

## 2021-08-14 NOTE — MAU Note (Signed)
Denise Tanner is a 21 y.o. at [redacted]w[redacted]d here in MAU reporting: +HPT x2 - 2 days ago.  Hx SAB x2.  Having lower left back pain, started before +preg. Denies abd pain or bleeding.  ?LMP: 1/12 ?Onset of complaint: over a wk ago ?Pain score: 5/10 ?Vitals:  ? 08/14/21 1519  ?BP: 117/66  ?Pulse: 97  ?Resp: 16  ?Temp: 99.1 ?F (37.3 ?C)  ?SpO2: 97%  ?   ? ?Lab orders placed from triage:  urine ?

## 2021-08-14 NOTE — Telephone Encounter (Signed)
Patient called stating that she started having lower back cramping that started yesterday. Pain is 6-7/10. Denies bleeding. She has not taken anything for the pain. Patient state that she has had a hx of miscarriages in the past. LMP 06/19/21. Patient advised to go to MAU to be evaluated. ?

## 2021-08-15 LAB — GC/CHLAMYDIA PROBE AMP (~~LOC~~) NOT AT ARMC
Chlamydia: NEGATIVE
Comment: NEGATIVE
Comment: NORMAL
Neisseria Gonorrhea: NEGATIVE

## 2021-08-22 ENCOUNTER — Other Ambulatory Visit: Payer: Self-pay

## 2021-08-22 ENCOUNTER — Ambulatory Visit (HOSPITAL_COMMUNITY)
Admission: RE | Admit: 2021-08-22 | Discharge: 2021-08-22 | Disposition: A | Discharge status: Home / Self Care | Payer: Medicaid Other | Source: Ambulatory Visit | Attending: Advanced Practice Midwife | Admitting: Advanced Practice Midwife

## 2021-08-22 DIAGNOSIS — Z3A01 Less than 8 weeks gestation of pregnancy: Secondary | ICD-10-CM | POA: Diagnosis present

## 2021-08-22 DIAGNOSIS — O99891 Other specified diseases and conditions complicating pregnancy: Secondary | ICD-10-CM | POA: Diagnosis present

## 2021-08-22 DIAGNOSIS — O3680X0 Pregnancy with inconclusive fetal viability, not applicable or unspecified: Secondary | ICD-10-CM | POA: Insufficient documentation

## 2021-08-22 DIAGNOSIS — M549 Dorsalgia, unspecified: Secondary | ICD-10-CM | POA: Diagnosis present

## 2021-08-25 ENCOUNTER — Telehealth: Payer: Self-pay

## 2021-08-25 ENCOUNTER — Telehealth: Payer: Self-pay | Admitting: Advanced Practice Midwife

## 2021-08-25 DIAGNOSIS — Z3491 Encounter for supervision of normal pregnancy, unspecified, first trimester: Secondary | ICD-10-CM

## 2021-08-25 MED ORDER — CONCEPT OB 130-92.4-1 MG PO CAPS: 1.0000 | ORAL_CAPSULE | Freq: Every day | ORAL | 11 refills | Status: DC

## 2021-08-25 NOTE — Telephone Encounter (Signed)
Patient called and left VM on nurse line requesting results from [place].  Pt would like to know what next steps are for scheduling appts. ?

## 2021-08-25 NOTE — Telephone Encounter (Signed)
Called pt to review her normal viability [place] today.  EDD 04/19/22, making pt [redacted]w[redacted]d today.  Questions answered. Message sent to [facility] to start prenatal care.  Rx for PNV sent to pharmacy requested.   ?

## 2021-08-26 ENCOUNTER — Telehealth: Payer: Self-pay

## 2021-08-26 MED ORDER — VITAFOL ULTRA 29-0.6-0.4-200 MG PO CAPS: 1.0000 | ORAL_CAPSULE | Freq: Every day | ORAL | 0 refills | Status: DC

## 2021-08-26 NOTE — Telephone Encounter (Signed)
error 

## 2021-08-26 NOTE — Telephone Encounter (Signed)
Returned call, pt states PNV that was sent is not covered by insurance, sent alternate. ?

## 2021-09-09 ENCOUNTER — Ambulatory Visit (INDEPENDENT_AMBULATORY_CARE_PROVIDER_SITE_OTHER): Payer: Medicaid Other | Admitting: *Deleted

## 2021-09-09 VITALS — BP 112/76 | HR 93

## 2021-09-09 DIAGNOSIS — Z3491 Encounter for supervision of normal pregnancy, unspecified, first trimester: Secondary | ICD-10-CM

## 2021-09-09 LAB — POCT URINALYSIS DIPSTICK
Glucose, UA: NEGATIVE
Nitrite, UA: NEGATIVE
Protein, UA: NEGATIVE
Spec Grav, UA: 1.015 (ref 1.010–1.025)
Urobilinogen, UA: 0.2 E.U./dL
pH, UA: 6 (ref 5.0–8.0)

## 2021-09-09 NOTE — Progress Notes (Signed)
SUBJECTIVE: Denise Tanner is a 21 y.o. female who denies urinary frequency, urgency and dysuria x 0 days, without flank pain, fever, chills, or abnormal vaginal discharge or bleeding. Reports some frequency but feels it is related to pregnancy. Concentrated Urine. "Just wanted to be sure". ? ?OBJECTIVE: Appears well, in no apparent distress.  Vital signs are normal. Urine dipstick shows positive for RBC's, positive for leukocytes, and positive for ketones.   ? ?ASSESSMENT: Concentrated Urine ? ?PLAN: Treatment per orders.  Call or return to clinic prn if these symptoms worsen or fail to improve as anticipated. Advised to increase water intake. Urine sent for culture per Dr. Waynard Edwards order. ?

## 2021-09-11 LAB — URINE CULTURE, OB REFLEX

## 2021-09-11 LAB — CULTURE, OB URINE

## 2021-09-22 ENCOUNTER — Ambulatory Visit (INDEPENDENT_AMBULATORY_CARE_PROVIDER_SITE_OTHER): Payer: Medicaid Other

## 2021-09-22 DIAGNOSIS — Z3480 Encounter for supervision of other normal pregnancy, unspecified trimester: Secondary | ICD-10-CM

## 2021-09-22 DIAGNOSIS — Z3A00 Weeks of gestation of pregnancy not specified: Secondary | ICD-10-CM

## 2021-09-22 DIAGNOSIS — Z3490 Encounter for supervision of normal pregnancy, unspecified, unspecified trimester: Secondary | ICD-10-CM | POA: Insufficient documentation

## 2021-09-22 MED ORDER — BLOOD PRESSURE KIT DEVI: 1.0000 | 0 refills | Status: DC | PRN

## 2021-09-22 MED ORDER — GOJJI WEIGHT SCALE MISC: 1.0000 | 0 refills | Status: DC | PRN

## 2021-09-22 NOTE — Progress Notes (Signed)
New OB Intake ? ?I connected with  Denise Tanner on 09/22/21 at  2:00 PM EDT by telephone visit and verified that I am speaking with the correct person using two identifiers. Nurse is located at [facility] and pt is located at home. ? ?I discussed the limitations, risks, security and privacy concerns of performing an evaluation and management service by telephone and the availability of in person appointments. I also discussed with the patient that there may be a patient responsible charge related to this service. The patient expressed understanding and agreed to proceed. ? ?I explained I am completing New OB Intake today. We discussed her EDD of 04/19/22 that is based on 08/22/21 ultrasound and confirmed on 08/25/21 by Sharen Counter, CNM. Pt is G3/P0. I reviewed her allergies, medications, Medical/Surgical/OB history, and appropriate screenings. I informed her of [facility] services. Based on history, this is a/an  pregnancy uncomplicated.  ? ?Patient Active Problem List  ? Diagnosis Date Noted  ? Encounter for supervision of normal pregnancy, antepartum 09/22/2021  ? H/O gonorrhea 07/23/2021  ? Bacterial vaginosis 09/18/2019  ? 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 Behavior concern   ? ? ?Concerns addressed today: ?Patient reports both prescribed PNV's causing diarrhea. Diarrhea stopped after discontinuing the rx. She prefers to continue OTC PNV gummies ?PHQ9=2 ?GAD7=0 ? ?Delivery Plans:  ?Plans to deliver at [facility].  ? ?MyChart/Babyscripts ?MyChart access verified. I explained pt will have some visits in office and some virtually. Babyscripts instructions given and order placed. Patient verifies receipt of registration text/e-mail. Account successfully created and app downloaded. ? ?Blood Pressure Cuff  ?Blood pressure cuff ordered for patient to pick-up from [employer]. Explained after first prenatal appt pt will check weekly and document in  Babyscripts. ? ?Weight scale: Patient does not have weight scale. Weight scale ordered for patient to pick up from [employer].  ? ? ?Labs ?Discussed [facility] Laine genetic screening with patient. Would like both Panorama and Horizon drawn at new OB visit. Also if interested in genetic testing, tell patient she will need AFP 15-21 weeks to complete genetic testing. Routine prenatal labs needed. ? ?Covid Vaccine ?Patient has not covid vaccine. Declined. ? ?Informed patient of [facility] website  and placed link in her AVS.  ? ?Social Determinants of Health ?Food Insecurity: Patient denies food insecurity. ?WIC Referral: Patient is interested in referral to [facility].  ?Transportation: Patient denies transportation needs. ?Childcare: Discussed no children allowed at ultrasound appointments. Offered childcare services; patient declines childcare services at this time. ? ?Send link to Pregnancy Navigators ?[facility] [employer] has a partnership with the [employer] to provide prenatal navigation for the most needed resources in our community. In order to see how we can help connect you to these resources we need consent to contact you. Please complete the very short consent using the link below:  ? ?English Link: [email or web address] ? ? ?Placed OB Box on problem list and updated ? ?First visit review ?I reviewed new OB appt with pt. I explained she will have a pelvic exam, ob bloodwork with genetic screening, and PAP smear. Explained pt will be seen by Collene Gobble, Craige Cotta, CNM at first visit; encounter routed to appropriate provider. Explained that patient will be seen by pregnancy navigator following visit with provider. [facility] information placed in AVS.  ? ?Dalphine Handing, CMA ?09/22/2021  2:54 PM  ? ? ?

## 2021-09-22 NOTE — Patient Instructions (Signed)
English Link: [email or web address] ? ? ? ?

## 2021-09-24 NOTE — Progress Notes (Signed)
Patient was assessed and managed by nursing staff during this encounter. I have reviewed the chart and agree with the documentation and plan. I have also made any necessary editorial changes. ? ?Oyuki Hogan, MD ?09/24/2021 6:41 AM  ? ?

## 2021-09-30 ENCOUNTER — Ambulatory Visit (INDEPENDENT_AMBULATORY_CARE_PROVIDER_SITE_OTHER): Payer: Medicaid Other | Admitting: Advanced Practice Midwife

## 2021-09-30 ENCOUNTER — Other Ambulatory Visit (HOSPITAL_COMMUNITY)
Admission: RE | Admit: 2021-09-30 | Discharge: 2021-09-30 | Disposition: A | Discharge status: Home / Self Care | Payer: Medicaid Other | Source: Ambulatory Visit | Attending: Advanced Practice Midwife | Admitting: Advanced Practice Midwife

## 2021-09-30 VITALS — BP 129/82 | HR 101 | Wt 161.0 lb

## 2021-09-30 DIAGNOSIS — Z3A11 11 weeks gestation of pregnancy: Secondary | ICD-10-CM

## 2021-09-30 DIAGNOSIS — Z3480 Encounter for supervision of other normal pregnancy, unspecified trimester: Secondary | ICD-10-CM

## 2021-09-30 DIAGNOSIS — Z3481 Encounter for supervision of other normal pregnancy, first trimester: Secondary | ICD-10-CM

## 2021-09-30 DIAGNOSIS — O98211 Gonorrhea complicating pregnancy, first trimester: Secondary | ICD-10-CM

## 2021-09-30 MED ORDER — VITAFOL GUMMIES 3.33-0.333-34.8 MG PO CHEW: 3.0000 | CHEWABLE_TABLET | Freq: Every day | ORAL | 11 refills | Status: DC

## 2021-09-30 MED ORDER — ASPIRIN EC 81 MG PO TBEC: 81.0000 mg | DELAYED_RELEASE_TABLET | Freq: Every day | ORAL | 11 refills | Status: DC

## 2021-09-30 NOTE — Progress Notes (Signed)
?  ? ?Subjective:  ? ?Denise Tanner is a 21 y.o. G3P0020 at 59w2dby early ultrasound being seen today for her first obstetrical visit.  Her obstetrical history is significant for  none  and has Adjustment disorder with mixed disturbance of emotions and conduct; Behavior concern; Bacterial vaginosis; H/O gonorrhea; and Encounter for supervision of normal pregnancy, antepartum on their problem list.. Patient does intend to breast feed. Pregnancy history fully reviewed. ? ?Patient reports no complaints. Had nausea that has now improved.  ? ?HISTORY: ?OB History  ?Gravida Para Term Preterm AB Living  ?3 0 0 0 2 0  ?SAB IAB Ectopic Multiple Live Births  ?1 0 0 0 0  ?  ?# Outcome Date GA Lbr Len/2nd Weight Sex Delivery Anes PTL Lv  ?3 Current           ?2 AB           ?1 SAB           ? ?Past Medical History:  ?Diagnosis Date  ? BV (bacterial vaginosis)   ? Gonorrhea   ? UTI (urinary tract infection)   ? ?Past Surgical History:  ?Procedure Laterality Date  ? NO PAST SURGERIES    ? ?Family History  ?Problem Relation Age of Onset  ? Sickle cell anemia Mother   ? Hypothyroidism Mother   ? Healthy Father   ?     was killed  ? ?Social History  ? ?Tobacco Use  ? Smoking status: Never  ?  Passive exposure: Never  ? Smokeless tobacco: Never  ?Vaping Use  ? Vaping Use: Never used  ?Substance Use Topics  ? Alcohol use: No  ? Drug use: No  ? ?No Known Allergies ?Current Outpatient Medications on File Prior to Visit  ?Medication Sig Dispense Refill  ? Blood Pressure Monitoring (BLOOD PRESSURE KIT) DEVI 1 Device by Does not apply route as needed. 1 each 0  ? cetirizine (ZYRTEC ALLERGY) 10 MG tablet Take 1 tablet (10 mg total) by mouth daily. (Patient not taking: Reported on 09/22/2021) 30 tablet 0  ? fluticasone (FLONASE) 50 MCG/ACT nasal spray Place 1 spray into both nostrils daily. (Patient not taking: Reported on 09/22/2021) 11.1 mL 1  ? lidocaine (XYLOCAINE) 2 % solution Use as directed 15 mLs in the mouth or throat as needed  for mouth pain. (Patient not taking: Reported on 09/22/2021) 100 mL 0  ? Misc. Devices (GOJJI WEIGHT SCALE) MISC 1 Device by Does not apply route as needed. 1 each 0  ? nitrofurantoin, macrocrystal-monohydrate, (MACROBID) 100 MG capsule Take 1 capsule (100 mg total) by mouth 2 (two) times daily. (Patient not taking: Reported on 09/22/2021) 10 capsule 0  ? Prenat w/o A Vit-FeFum-FePo-FA (CONCEPT OB) 130-92.4-1 MG CAPS Take 1 capsule by mouth daily. (Patient not taking: Reported on 09/22/2021) 30 capsule 11  ? Prenat-Fe Poly-Methfol-FA-DHA (VITAFOL ULTRA) 29-0.6-0.4-200 MG CAPS Take 1 tablet by mouth daily. (Patient not taking: Reported on 09/22/2021) 30 capsule 0  ? Prenatal Vit-Fe Fumarate-FA (MULTIVITAMIN-PRENATAL) 27-0.8 MG TABS tablet Take 1 tablet by mouth daily at 12 noon. (Patient not taking: Reported on 09/22/2021)    ? Prenatal Vit-Fe Fumarate-FA (PRENATAL 19) 29-1 MG CHEW Chew by mouth. Over the counter    ? [DISCONTINUED] ipratropium (ATROVENT) 0.03 % nasal spray Place 2 sprays into both nostrils 2 (two) times daily. (Patient not taking: Reported on 03/25/2020) 30 mL 0  ? [DISCONTINUED] medroxyPROGESTERone (DEPO-PROVERA) 150 MG/ML injection Inject 150 mg into the muscle  every 3 (three) months.    ? [DISCONTINUED] sodium chloride (OCEAN) 0.65 % SOLN nasal spray Place 1 spray into both nostrils as needed for congestion. And to moisturize nose 60 mL 0  ? ?No current facility-administered medications on file prior to visit.  ? ? ? Indications for ASA therapy (per uptodate) ?One of the following: ?Previous pregnancy with preeclampsia, especially early onset and with an adverse outcome No ?Multifetal gestation No ?Chronic hypertension No ?Type 1 or 2 diabetes mellitus No ?Chronic kidney disease No ?Autoimmune disease (antiphospholipid syndrome, systemic lupus erythematosus) No ? ? Two or more of the following: ?Nulliparity Yes ?Obesity (body mass index >30 kg/m2) No ?Family history of preeclampsia in mother or sister  No ?Age ?35 years No ?Sociodemographic characteristics (African American race, low socioeconomic level) Yes ?Personal risk factors (eg, previous pregnancy with low birth weight or small for gestational age infant, previous adverse pregnancy outcome [eg, stillbirth], interval >10 years between pregnancies) No ? ? Indications for early 1 hour GTT (per uptodate) ? ?BMI >25 (>23 in Asian women) AND one of the following ? ?Gestational diabetes mellitus in a previous pregnancy No ?Glycated hemoglobin ?5.7 percent (39 mmol/mol), impaired glucose tolerance, or impaired fasting glucose on previous testing No ?First-degree relative with diabetes No ?High-risk race/ethnicity (eg, African American, Latino, Native American, [place] American, [place]) Yes ?History of cardiovascular disease No ?Hypertension or on therapy for hypertension No ?High-density lipoprotein cholesterol level <35 mg/dL (0.90 mmol/L) and/or a triglyceride level >250 mg/dL (2.82 mmol/L) No ?Polycystic ovary syndrome No ?Physical inactivity No ?Other clinical condition associated with insulin resistance (eg, severe obesity, acanthosis nigricans) No ?Previous birth of an infant weighing ?4000 g No ?Previous stillbirth of unknown cause No ?Exam  ? ?Vitals:  ? 09/30/21 0848  ?BP: 129/82  ?Pulse: (!) 101  ?Weight: 161 lb (73 kg)  ? ?Fetal Heart Rate (bpm): 161 ? ?Uterus:     ?Pelvic Exam: Perineum: no hemorrhoids, normal perineum  ? Vulva: normal external genitalia, no lesions  ? Vagina:  normal mucosa, normal discharge  ? Cervix: no lesions and normal, pap smear done.   ? Adnexa: normal adnexa and no mass, fullness, tenderness  ? Bony Pelvis: average  ?System: General: well-developed, well-nourished female in no acute distress  ? Breast:  normal appearance, no masses or tenderness  ? Skin: normal coloration and turgor, no rashes  ? Neurologic: oriented, normal, negative, normal mood  ? Extremities: normal strength, tone, and muscle mass, ROM of all joints  is normal  ? HEENT PERRLA, extraocular movement intact and sclera clear, anicteric  ? Mouth/Teeth mucous membranes moist, pharynx normal without lesions and dental hygiene good  ? Neck supple and no masses  ? Cardiovascular: regular rate and rhythm  ? Respiratory:  no respiratory distress, normal breath sounds  ? Abdomen: soft, non-tender; bowel sounds normal; no masses,  no organomegaly  ? ?  ?Assessment:  ? ?Pregnancy: [id number] ?Patient Active Problem List  ? Diagnosis Date Noted  ? Encounter for supervision of normal pregnancy, antepartum 09/22/2021  ? H/O gonorrhea 07/23/2021  ? Bacterial vaginosis 09/18/2019  ? 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 Behavior concern   ? ?  ?Plan:  ?1. Supervision of other normal pregnancy, antepartum ?--Anticipatory guidance about next visits/weeks of pregnancy given.  ?--Discussed early screening for GDM and preventing preeclampsia based on risk factors.  BASA to start this week.  ?--Pap 07/23/21 ? ?2. [redacted] weeks gestation of pregnancy ? ? ? ? ?  Initial labs drawn. ?Continue prenatal vitamins. ?Discussed and offered genetic screening options, including Quad screen/AFP, NIPS testing, and option to decline testing. Benefits/risks/alternatives reviewed. Pt aware that anatomy US is form of genetic screening with lower accuracy in detecting trisomies than blood work.  Pt chooses genetic screening today. NIPS: ordered. ?Ultrasound discussed; fetal anatomic survey: ordered. ?Problem list reviewed and updated. ?The nature of [place] with multiple MDs and other Advanced Practice Providers was explained to patient; also emphasized that residents, students are part of our team. ?Routine obstetric precautions reviewed. ?No follow-ups on file. ? ? ?Fatima Blank, CNM ?09/30/21 ?9:05 AM ? ?

## 2021-10-01 ENCOUNTER — Ambulatory Visit (HOSPITAL_COMMUNITY)
Admission: EM | Admit: 2021-10-01 | Discharge: 2021-10-01 | Disposition: A | Discharge status: Home / Self Care | Payer: Medicaid Other | Attending: Family Medicine | Admitting: Family Medicine

## 2021-10-01 ENCOUNTER — Encounter (HOSPITAL_COMMUNITY): Payer: Self-pay | Admitting: Emergency Medicine

## 2021-10-01 ENCOUNTER — Encounter: Payer: Self-pay | Admitting: Advanced Practice Midwife

## 2021-10-01 DIAGNOSIS — H9201 Otalgia, right ear: Secondary | ICD-10-CM

## 2021-10-01 DIAGNOSIS — O98211 Gonorrhea complicating pregnancy, first trimester: Secondary | ICD-10-CM | POA: Insufficient documentation

## 2021-10-01 DIAGNOSIS — Z20822 Contact with and (suspected) exposure to covid-19: Secondary | ICD-10-CM | POA: Diagnosis not present

## 2021-10-01 DIAGNOSIS — J302 Other seasonal allergic rhinitis: Secondary | ICD-10-CM

## 2021-10-01 LAB — CBC/D/PLT+RPR+RH+ABO+RUBIGG...
Antibody Screen: NEGATIVE
Basophils Absolute: 0 10*3/uL (ref 0.0–0.2)
Basos: 0 %
EOS (ABSOLUTE): 0.1 10*3/uL (ref 0.0–0.4)
Eos: 1 %
HCV Ab: NONREACTIVE
HIV Screen 4th Generation wRfx: NONREACTIVE
Hematocrit: 42.8 % (ref 34.0–46.6)
Hemoglobin: 14 g/dL (ref 11.1–15.9)
Hepatitis B Surface Ag: NEGATIVE
Immature Grans (Abs): 0.1 10*3/uL (ref 0.0–0.1)
Immature Granulocytes: 1 %
Lymphocytes Absolute: 2 10*3/uL (ref 0.7–3.1)
Lymphs: 21 %
MCH: 27.7 pg (ref 26.6–33.0)
MCHC: 32.7 g/dL (ref 31.5–35.7)
MCV: 85 fL (ref 79–97)
Monocytes Absolute: 0.9 10*3/uL (ref 0.1–0.9)
Monocytes: 10 %
Neutrophils Absolute: 6.5 10*3/uL (ref 1.4–7.0)
Neutrophils: 67 %
Platelets: 272 10*3/uL (ref 150–450)
RBC: 5.06 x10E6/uL (ref 3.77–5.28)
RDW: 12.5 % (ref 11.7–15.4)
RPR Ser Ql: NONREACTIVE
Rh Factor: POSITIVE
Rubella Antibodies, IGG: 3.41 index (ref >0.99)
WBC: 9.6 10*3/uL (ref 3.4–10.8)

## 2021-10-01 LAB — CERVICOVAGINAL ANCILLARY ONLY
Chlamydia: NEGATIVE
Comment: NEGATIVE
Comment: NEGATIVE
Comment: NORMAL
Neisseria Gonorrhea: POSITIVE — AB
Trichomonas: NEGATIVE

## 2021-10-01 LAB — HCV INTERPRETATION

## 2021-10-01 LAB — HEMOGLOBIN A1C
Est. average glucose Bld gHb Est-mCnc: 100 mg/dL
Hgb A1c MFr Bld: 5.1 % (ref 4.8–5.6)

## 2021-10-01 MED ORDER — CETIRIZINE HCL 5 MG/5ML PO SOLN: 10.0000 mg | Freq: Every day | ORAL | 1 refills | Status: DC

## 2021-10-01 MED ORDER — CEFTRIAXONE SODIUM 500 MG IJ SOLR
500.0000 mg | Freq: Once | INTRAMUSCULAR | Status: AC
  Administered 2021-10-02: 500 mg via INTRAMUSCULAR

## 2021-10-01 NOTE — ED Provider Notes (Signed)
?  [facility] ? ? ?[id number] ?10/01/21 Arrival Time: 1150 ? ?ASSESSMENT & PLAN: ? ?1. Otalgia of right ear   ?2. Seasonal allergies   ? ?No signs of bacterial infection. ?COVID testing sent at pt request. ?Discussed typical duration of viral illnesses +/- allergies. ?OTC symptom care as needed. ? ?Begin: ?Discharge Medication List as of 10/01/2021  2:08 PM  ?  ? ?START taking these medications  ? Details  ?cetirizine HCl (ZYRTEC) 5 MG/5ML SOLN Take 10 mLs (10 mg total) by mouth daily., Starting Wed 10/01/2021, Normal  ?  ?  ? ? ? Follow-up Information   ? ? Radene Gunning, NP.   ?Specialty: Pediatrics ?Why: If worsening or failing to improve as anticipated. ?Contact information: ?[address] ?[place] [address] ?[phone] ? ? ?  ?  ? ?  ?  ? ?  ? ? ?Reviewed expectations re: course of current medical issues. Questions answered. ?Outlined signs and symptoms indicating need for more acute intervention. ?Understanding verbalized. ?After Visit Summary given. ? ? ?SUBJECTIVE: ?History from: Patient. ?Denise Tanner is a 21 y.o. female. Reports: 11 w pregnant. Reports R. Denies: fever and difficulty breathing. Normal PO intake without n/v/d. ? ?OBJECTIVE: ? ?Vitals:  ? 10/01/21 1323  ?BP: 102/74  ?Pulse: (!) 102  ?Resp: 18  ?Temp: 99.2 ?F (37.3 ?C)  ?TempSrc: Oral  ?SpO2: 98%  ?  ?Recheck P: 96 ?General appearance: alert; no distress ?Eyes: PERRLA; EOMI; conjunctiva normal ?HENT: [place]; AT; with mild nasal congestion; R serous otitis; R EAC normal ?Neck: supple  ?Lungs: speaks full sentences without difficulty; unlabored ?Extremities: no edema ?Skin: warm and dry ?Neurologic: normal gait ?Psychological: alert and cooperative; normal mood and affect ? ?Labs: ? ?Labs Reviewed  ?SARS CORONAVIRUS 2 (TAT 6-24 HRS)  ? ? ? ?No Known Allergies ? ?Past Medical History:  ?Diagnosis Date  ? BV (bacterial vaginosis)   ? Gonorrhea   ? UTI (urinary tract infection)   ? ?Social History  ? ?Socioeconomic  History  ? Marital status: Single  ?  Spouse name: Not on file  ? Number of children: Not on file  ? Years of education: Not on file  ? Highest education level: Not on file  ?Occupational History  ? Not on file  ?Tobacco Use  ? Smoking status: Never  ?  Passive exposure: Never  ? Smokeless tobacco: Never  ?Vaping Use  ? Vaping Use: Never used  ?Substance and Sexual Activity  ? Alcohol use: No  ? Drug use: No  ? Sexual activity: Not Currently  ?  Partners: Male  ?  Birth control/protection: None  ?Other Topics Concern  ? Not on file  ?Social History Narrative  ?   ?   ? ?Social Determinants of Health  ? ?Financial Resource Strain: Not on file  ?Food Insecurity: Not on file  ?Transportation Needs: Not on file  ?Physical Activity: Not on file  ?Stress: Not on file  ?Social Connections: Not on file  ?Intimate Partner Violence: Not on file  ? ?Family History  ?Problem Relation Age of Onset  ? Sickle cell anemia Mother   ? Hypothyroidism Mother   ? Healthy Father   ?     was killed  ? ?Past Surgical History:  ?Procedure Laterality Date  ? NO PAST SURGERIES    ? ?  ?Mardella Layman, MD ?10/01/21 1456 ? ?

## 2021-10-01 NOTE — ED Triage Notes (Signed)
Pt reports little over [redacted] weeks pregnant. Reports bilat ear pain but worse on right side. Having congestion.  ?

## 2021-10-01 NOTE — Addendum Note (Signed)
Addended by: Sharen Counter A on: 10/01/2021 06:01 PM ? ? Modules accepted: Orders ? ?

## 2021-10-01 NOTE — Discharge Instructions (Addendum)
You may take Tylenol while pregnant. ?

## 2021-10-02 ENCOUNTER — Ambulatory Visit (INDEPENDENT_AMBULATORY_CARE_PROVIDER_SITE_OTHER): Payer: Medicaid Other | Admitting: Emergency Medicine

## 2021-10-02 VITALS — BP 114/77 | HR 112 | Wt 161.0 lb

## 2021-10-02 DIAGNOSIS — O98211 Gonorrhea complicating pregnancy, first trimester: Secondary | ICD-10-CM | POA: Diagnosis not present

## 2021-10-02 DIAGNOSIS — Z3A11 11 weeks gestation of pregnancy: Secondary | ICD-10-CM | POA: Diagnosis not present

## 2021-10-02 LAB — SARS CORONAVIRUS 2 (TAT 6-24 HRS): SARS Coronavirus 2: NEGATIVE

## 2021-10-02 NOTE — Progress Notes (Signed)
Patient presents for Rocephin injection for Gonnorhea tx per protocol. Given in [facility], tolerated well, no adverse reactions reported. ?

## 2021-10-06 ENCOUNTER — Encounter: Payer: Self-pay | Admitting: Advanced Practice Midwife

## 2021-10-28 ENCOUNTER — Encounter: Payer: Self-pay | Admitting: Obstetrics & Gynecology

## 2021-10-28 ENCOUNTER — Other Ambulatory Visit (HOSPITAL_COMMUNITY)
Admission: RE | Admit: 2021-10-28 | Discharge: 2021-10-28 | Disposition: A | Discharge status: Home / Self Care | Payer: Medicaid Other | Source: Ambulatory Visit | Attending: Obstetrics & Gynecology | Admitting: Obstetrics & Gynecology

## 2021-10-28 ENCOUNTER — Ambulatory Visit (INDEPENDENT_AMBULATORY_CARE_PROVIDER_SITE_OTHER): Payer: Medicaid Other | Admitting: Obstetrics & Gynecology

## 2021-10-28 VITALS — BP 116/78 | HR 91 | Wt 163.2 lb

## 2021-10-28 DIAGNOSIS — O98219 Gonorrhea complicating pregnancy, unspecified trimester: Secondary | ICD-10-CM

## 2021-10-28 DIAGNOSIS — Z3A15 15 weeks gestation of pregnancy: Secondary | ICD-10-CM

## 2021-10-28 DIAGNOSIS — Z3480 Encounter for supervision of other normal pregnancy, unspecified trimester: Secondary | ICD-10-CM

## 2021-10-28 NOTE — Progress Notes (Signed)
   PRENATAL VISIT NOTE  Subjective:  Denise Tanner is a 21 y.o. G3P0020 at [redacted]w[redacted]d being seen today for ongoing prenatal care.  She is currently monitored for the following issues for this low-risk pregnancy and has Adjustment disorder with mixed disturbance of emotions and conduct; Behavior concern; Bacterial vaginosis; H/O gonorrhea; Encounter for supervision of normal pregnancy, antepartum; and Gonorrhea affecting pregnancy in first trimester on their problem list.  Patient reports no complaints.  Contractions: Not present. Vag. Bleeding: None.  Movement: Present. Denies leaking of fluid.   The following portions of the patient's history were reviewed and updated as appropriate: allergies, current medications, past family history, past medical history, past social history, past surgical history and problem list.   Objective:   Vitals:   10/28/21 0836  BP: 116/78  Pulse: 91  Weight: 163 lb 3.2 oz (74 kg)    Fetal Status: Fetal Heart Rate (bpm): 155   Movement: Present     General:  Alert, oriented and cooperative. Patient is in no acute distress.  Skin: Skin is warm and dry. No rash noted.   Cardiovascular: Normal heart rate noted  Respiratory: Normal respiratory effort, no problems with respiration noted  Abdomen: Soft, gravid, appropriate for gestational age.  Pain/Pressure: Absent     Pelvic: Cervical exam deferred        Extremities: Normal range of motion.  Edema: None  Mental Status: Normal mood and affect. Normal behavior. Normal judgment and thought content.   Assessment and Plan:  Pregnancy: G3P0020 at [redacted]w[redacted]d 1. Gonorrhea affecting pregnancy, antepartum Test of cure done today, will follow up results and manage accordingly. - Cervicovaginal ancillary only( [place]) self swab done  2. [redacted] weeks gestation of pregnancy 3. Supervision of other normal pregnancy, antepartum LR NIPS, anatomy scan already scheduled. AFP today - AFP, Serum, Open Spina Bifida No other  complaints or concerns.  Routine obstetric precautions reviewed.  Please refer to After Visit Summary for other counseling recommendations.   Return in about 4 weeks (around 11/25/2021) for OFFICE OB VISIT (MD or APP).  Future Appointments  Date Time Provider Department Center  11/24/2021 11:30 AM [facility]    Jaynie Collins, MD

## 2021-10-29 LAB — CERVICOVAGINAL ANCILLARY ONLY
Comment: NEGATIVE
Comment: NEGATIVE
Comment: NORMAL
Trichomonas: NEGATIVE

## 2021-10-29 NOTE — Progress Notes (Signed)
Patient needs to come in to re-do the self-swab for test of cure for gonorrhea, had inadequate specimen. Please call to inform patient of results and recommendations.

## 2021-10-30 ENCOUNTER — Ambulatory Visit (INDEPENDENT_AMBULATORY_CARE_PROVIDER_SITE_OTHER): Payer: Medicaid Other

## 2021-10-30 ENCOUNTER — Other Ambulatory Visit (HOSPITAL_COMMUNITY)
Admission: RE | Admit: 2021-10-30 | Discharge: 2021-10-30 | Disposition: A | Discharge status: Home / Self Care | Payer: Medicaid Other | Source: Ambulatory Visit | Attending: Obstetrics & Gynecology | Admitting: Obstetrics & Gynecology

## 2021-10-30 DIAGNOSIS — O98219 Gonorrhea complicating pregnancy, unspecified trimester: Secondary | ICD-10-CM

## 2021-10-30 LAB — AFP, SERUM, OPEN SPINA BIFIDA
AFP MoM: 0.81
AFP Value: 26.5 ng/mL
Gest. Age on Collection Date: 15.3 weeks
Maternal Age At EDD: 22 yr
OSBR Risk 1 IN: 10000
Test Results:: NEGATIVE
Weight: 163 [lb_av]

## 2021-10-30 NOTE — Progress Notes (Cosign Needed)
Patient presents for a repeat TOC for gonorrhea due to not getting enough sample with the first test. Patient informed that it may take 24-48 hours for results to return. Instructed patient on the correct way to collect specimen. Patient verbalized understanding.

## 2021-11-04 LAB — CERVICOVAGINAL ANCILLARY ONLY
Bacterial Vaginitis (gardnerella): NEGATIVE
Candida Glabrata: NEGATIVE
Candida Vaginitis: NEGATIVE
Chlamydia: NEGATIVE
Comment: NEGATIVE
Comment: NEGATIVE
Comment: NEGATIVE
Comment: NEGATIVE
Comment: NEGATIVE
Comment: NORMAL
Neisseria Gonorrhea: NEGATIVE
Trichomonas: NEGATIVE

## 2021-11-11 ENCOUNTER — Other Ambulatory Visit: Payer: Self-pay | Admitting: Emergency Medicine

## 2021-11-11 NOTE — Progress Notes (Signed)
Insurance will not cover gummy PNV. Pt's mother made aware, states she will purchase otc.

## 2021-11-24 ENCOUNTER — Ambulatory Visit: Payer: Medicaid Other | Attending: Advanced Practice Midwife

## 2021-11-24 DIAGNOSIS — O358XX0 Maternal care for other (suspected) fetal abnormality and damage, not applicable or unspecified: Secondary | ICD-10-CM | POA: Insufficient documentation

## 2021-11-24 DIAGNOSIS — Z363 Encounter for antenatal screening for malformations: Secondary | ICD-10-CM | POA: Insufficient documentation

## 2021-11-24 DIAGNOSIS — Z3A19 19 weeks gestation of pregnancy: Secondary | ICD-10-CM | POA: Insufficient documentation

## 2021-11-24 DIAGNOSIS — Z3480 Encounter for supervision of other normal pregnancy, unspecified trimester: Secondary | ICD-10-CM | POA: Diagnosis not present

## 2021-11-25 ENCOUNTER — Ambulatory Visit (INDEPENDENT_AMBULATORY_CARE_PROVIDER_SITE_OTHER): Payer: Medicaid Other | Admitting: Advanced Practice Midwife

## 2021-11-25 ENCOUNTER — Other Ambulatory Visit (HOSPITAL_COMMUNITY)
Admission: RE | Admit: 2021-11-25 | Discharge: 2021-11-25 | Disposition: A | Discharge status: Home / Self Care | Payer: Medicaid Other | Source: Ambulatory Visit | Attending: Advanced Practice Midwife | Admitting: Advanced Practice Midwife

## 2021-11-25 VITALS — BP 116/74 | HR 84 | Wt 166.0 lb

## 2021-11-25 DIAGNOSIS — Z113 Encounter for screening for infections with a predominantly sexual mode of transmission: Secondary | ICD-10-CM | POA: Diagnosis present

## 2021-11-25 DIAGNOSIS — Z3A19 19 weeks gestation of pregnancy: Secondary | ICD-10-CM

## 2021-11-25 DIAGNOSIS — Z3480 Encounter for supervision of other normal pregnancy, unspecified trimester: Secondary | ICD-10-CM

## 2021-11-25 DIAGNOSIS — O98219 Gonorrhea complicating pregnancy, unspecified trimester: Secondary | ICD-10-CM

## 2021-11-25 NOTE — Progress Notes (Signed)
   PRENATAL VISIT NOTE  Subjective:  Denise Tanner is a 21 y.o. G3P0020 at [redacted]w[redacted]d being seen today for ongoing prenatal care.  She is currently monitored for the following issues for this low-risk pregnancy and has Adjustment disorder with mixed disturbance of emotions and conduct; Encounter for supervision of normal pregnancy, antepartum; and Gonorrhea affecting pregnancy in first trimester on their problem list.  Patient reports no complaints.  Contractions: Not present. Vag. Bleeding: None.  Movement: Present. Denies leaking of fluid.   The following portions of the patient's history were reviewed and updated as appropriate: allergies, current medications, past family history, past medical history, past social history, past surgical history and problem list.   Objective:   Vitals:   11/25/21 0934  BP: 116/74  Pulse: 84  Weight: 166 lb (75.3 kg)    Fetal Status: Fetal Heart Rate (bpm): 145 Fundal Height: 19 cm Movement: Present     General:  Alert, oriented and cooperative. Patient is in no acute distress.  Skin: Skin is warm and dry. No rash noted.   Cardiovascular: Normal heart rate noted  Respiratory: Normal respiratory effort, no problems with respiration noted  Abdomen: Soft, gravid, appropriate for gestational age.  Pain/Pressure: Absent     Pelvic: Cervical exam deferred        Extremities: Normal range of motion.  Edema: Trace  Mental Status: Normal mood and affect. Normal behavior. Normal judgment and thought content.   Assessment and Plan:  Pregnancy: G3P0020 at [redacted]w[redacted]d 1. Supervision of other normal pregnancy, antepartum --Anticipatory guidance about next visits/weeks of pregnancy given.  --Reviewed normal anatomy US results from 6/19. EFW 92%, FH wnl today.  F/U as clinically indicated per MFM. --Total weight gain 16 lbs so wnl  2. Gonorrhea affecting pregnancy, antepartum --Tx 4/27 and TOC neg on 5/23  3. [redacted] weeks gestation of pregnancy   4. Routine screening  for STI (sexually transmitted infection) --Pt TOC for gonorrhea neg and now using condoms with her partner. She denies any symptoms but desires STI testing today. Declines serum testing. - Cervicovaginal ancillary only( [place])   Preterm labor symptoms and general obstetric precautions including but not limited to vaginal bleeding, contractions, leaking of fluid and fetal movement were reviewed in detail with the patient. Please refer to After Visit Summary for other counseling recommendations.   Return in about 4 weeks (around 12/23/2021) for LOB.  No future appointments.   Sharen Counter, CNM

## 2021-11-26 LAB — CERVICOVAGINAL ANCILLARY ONLY
Chlamydia: NEGATIVE
Comment: NEGATIVE
Comment: NEGATIVE
Comment: NORMAL
Neisseria Gonorrhea: NEGATIVE
Trichomonas: POSITIVE — AB

## 2021-11-27 ENCOUNTER — Other Ambulatory Visit: Payer: Self-pay

## 2021-11-27 ENCOUNTER — Telehealth: Payer: Self-pay

## 2021-11-27 DIAGNOSIS — A599 Trichomoniasis, unspecified: Secondary | ICD-10-CM

## 2021-11-27 MED ORDER — METRONIDAZOLE 500 MG PO TABS: 500.0000 mg | ORAL_TABLET | Freq: Two times a day (BID) | ORAL | 0 refills | Status: DC

## 2021-11-27 NOTE — Telephone Encounter (Signed)
Patient called requesting call back regarding results and treatment.  Discussed results with patient and that treatment has been sent to the pharmacy. Patient advised to abstain from sexual intercourse for at least a week after she and her partner has completed treatment. Patient also advised to inform her partner about the positive result and the need for treatment. Patient verbalized understanding.

## 2021-12-01 ENCOUNTER — Telehealth: Payer: Self-pay

## 2021-12-01 NOTE — Telephone Encounter (Signed)
Patient called in reporting vaginal irritation and stated that she used her BF's clippers to shave and clippers were not properly cleaned. Pt is already taking antibiotics for an infection,advised to finish meds and monitor symptoms. Pt states that symptoms have already improved since yesterday.

## 2021-12-06 ENCOUNTER — Other Ambulatory Visit: Payer: Self-pay

## 2021-12-06 ENCOUNTER — Inpatient Hospital Stay (HOSPITAL_COMMUNITY)
Admission: AD | Admit: 2021-12-06 | Discharge: 2021-12-06 | Disposition: A | Discharge status: Home / Self Care | Payer: Medicaid Other | Attending: Obstetrics and Gynecology | Admitting: Obstetrics and Gynecology

## 2021-12-06 DIAGNOSIS — A6009 Herpesviral infection of other urogenital tract: Secondary | ICD-10-CM | POA: Insufficient documentation

## 2021-12-06 DIAGNOSIS — Z3A20 20 weeks gestation of pregnancy: Secondary | ICD-10-CM | POA: Insufficient documentation

## 2021-12-06 DIAGNOSIS — Z8349 Family history of other endocrine, nutritional and metabolic diseases: Secondary | ICD-10-CM | POA: Insufficient documentation

## 2021-12-06 DIAGNOSIS — O98512 Other viral diseases complicating pregnancy, second trimester: Secondary | ICD-10-CM

## 2021-12-06 DIAGNOSIS — R21 Rash and other nonspecific skin eruption: Secondary | ICD-10-CM | POA: Diagnosis present

## 2021-12-06 DIAGNOSIS — O98312 Other infections with a predominantly sexual mode of transmission complicating pregnancy, second trimester: Secondary | ICD-10-CM | POA: Insufficient documentation

## 2021-12-06 DIAGNOSIS — B009 Herpesviral infection, unspecified: Secondary | ICD-10-CM

## 2021-12-06 MED ORDER — VALACYCLOVIR HCL 1 G PO TABS: 1000.0000 mg | ORAL_TABLET | Freq: Three times a day (TID) | ORAL | 0 refills | Status: AC

## 2021-12-06 NOTE — MAU Provider Note (Signed)
History     CSN: [id number]  Arrival date and time: 12/06/21 1527   Event Date/Time   First Provider Initiated Contact with Patient 12/06/21 1649      Chief Complaint  Patient presents with   Leg Rash   HPI  Denise Tanner is a 21 y.o. G3P0020 at [redacted]w[redacted]d who presents for evaluation of a rash around her vagina and inner thighs. Patient reports she has been walking outside with no underwear on and thinks the rubbing has caused this. She reports she has tried multiple ointments with no relief. Patient rates the pain as a 6/10. She denies any vaginal bleeding, discharge, and leaking of fluid. Denies any constipation, diarrhea or any urinary complaints. Reports normal fetal movement.   OB History     Gravida  3   Para  0   Term  0   Preterm  0   AB  2   Living  0      SAB  1   IAB  0   Ectopic  0   Multiple  0   Live Births              Past Medical History:  Diagnosis Date   BV (bacterial vaginosis)    Gonorrhea    UTI (urinary tract infection)     Past Surgical History:  Procedure Laterality Date   NO PAST SURGERIES      Family History  Problem Relation Age of Onset   Sickle cell anemia Mother    Hypothyroidism Mother    Healthy Father        was killed    Social History   Tobacco Use   Smoking status: Never    Passive exposure: Never   Smokeless tobacco: Never  Vaping Use   Vaping Use: Never used  Substance Use Topics   Alcohol use: No   Drug use: No    Allergies: No Known Allergies  No medications prior to admission.    Review of Systems  Constitutional: Negative.  Negative for fatigue and fever.  HENT: Negative.    Respiratory: Negative.  Negative for shortness of breath.   Cardiovascular: Negative.  Negative for chest pain.  Gastrointestinal: Negative.  Negative for abdominal pain, constipation, diarrhea, nausea and vomiting.  Genitourinary:  Positive for vaginal pain. Negative for dysuria, vaginal bleeding and vaginal  discharge.  Neurological: Negative.  Negative for dizziness and headaches.   Physical Exam   Blood pressure 111/67, pulse 89, temperature 98.4 F (36.9 C), temperature source Oral, resp. rate 15, height 5\' 5"  (1.651 m), weight 75.2 kg, last menstrual period 06/19/2021, SpO2 100 %.  Patient Vitals for the past 24 hrs:  BP Temp Temp src Pulse Resp SpO2 Height Weight  12/06/21 1727 111/67 -- -- 89 -- -- -- --  12/06/21 1641 117/64 98.4 F (36.9 C) Oral 96 15 100 % 5\' 5"  (1.651 m) 75.2 kg    Physical Exam Vitals and nursing note reviewed.  Constitutional:      General: She is not in acute distress.    Appearance: She is well-developed.  HENT:     Head: Normocephalic.  Eyes:     Pupils: Pupils are equal, round, and reactive to light.  Cardiovascular:     Rate and Rhythm: Normal rate and regular rhythm.     Heart sounds: Normal heart sounds.  Pulmonary:     Effort: Pulmonary effort is normal. No respiratory distress.     Breath sounds: Normal breath  sounds.  Abdominal:     General: Bowel sounds are normal. There is no distension.     Palpations: Abdomen is soft.     Tenderness: There is no abdominal tenderness.  Genitourinary:      Comments: Diffuse vesicular rash on inner left thigh and weeping vesicles on right labia Skin:    General: Skin is warm and dry.  Neurological:     Mental Status: She is alert and oriented to person, place, and time.  Psychiatric:        Mood and Affect: Mood normal.        Behavior: Behavior normal.        Thought Content: Thought content normal.        Judgment: Judgment normal.    FHR: 132 bpm  MAU Course  Procedures  MDM Labs ordered and reviewed.   HSV PCR  Discussed with patient that lesions are suspicious for HSV and recommend that we swap and treat as HSV to prevent worsening of the outbreak. She reports she recently had trichomonas as well. Discussed inability to determine when she would have gotten HSV and how to treat  outbreaks  Assessment and Plan   1. HSV-2 infection complicating pregnancy, second trimester   2. [redacted] weeks gestation of pregnancy     -Discharge home in stable condition -Rx for valtrex sent to patient's pharmacy -HSV precautions discussed -Patient advised to follow-up with OB as scheduled for prenatal care -Patient may return to MAU as needed or if her condition were to change or worsen  Rolm Bookbinder, CNM 12/06/2021, 6:56 PM

## 2021-12-06 NOTE — MAU Note (Addendum)
...  Denise Tanner is a 21 y.o. at [redacted]w[redacted]d here in MAU reporting: Rash with tiny bumps on her inner upper thighs near her vagina. She reports she has been wearing dresses with no underwear and her legs have been rubbing together so she is unsure if this is what caused it. She reports the rash is not on her vagina. She reports she has tried applying Vaseline and A&D ointment per her OB but it does not relieve her pain. She reports it is worse with walking.  Onset of complaint: Three days ago Pain score: 8/10 upper inner thighs  FHT: 132 doppler

## 2021-12-07 ENCOUNTER — Other Ambulatory Visit: Payer: Self-pay

## 2021-12-07 ENCOUNTER — Encounter (HOSPITAL_COMMUNITY): Payer: Self-pay | Admitting: *Deleted

## 2021-12-07 ENCOUNTER — Ambulatory Visit (HOSPITAL_COMMUNITY)
Admission: EM | Admit: 2021-12-07 | Discharge: 2021-12-07 | Disposition: A | Discharge status: Home / Self Care | Payer: Medicaid Other | Attending: Student | Admitting: Student

## 2021-12-07 DIAGNOSIS — Z3A21 21 weeks gestation of pregnancy: Secondary | ICD-10-CM | POA: Diagnosis not present

## 2021-12-07 DIAGNOSIS — N760 Acute vaginitis: Secondary | ICD-10-CM | POA: Diagnosis not present

## 2021-12-07 DIAGNOSIS — O23592 Infection of other part of genital tract in pregnancy, second trimester: Secondary | ICD-10-CM | POA: Diagnosis not present

## 2021-12-07 DIAGNOSIS — O98312 Other infections with a predominantly sexual mode of transmission complicating pregnancy, second trimester: Secondary | ICD-10-CM

## 2021-12-07 MED ORDER — MICONAZOLE NITRATE 2 % VA CREA
1.0000 | TOPICAL_CREAM | Freq: Every day | VAGINAL | 0 refills | Status: AC
Start: 2021-12-07 — End: 2021-12-14

## 2021-12-07 NOTE — MAU Note (Signed)
Opened chart to review previous day's order with lab.

## 2021-12-07 NOTE — ED Triage Notes (Signed)
Rash on bil inner thigh. [facility] and was [facility]bed for herpes . Pt wants medication for yeast on inner thigh.

## 2021-12-07 NOTE — Discharge Instructions (Addendum)
-  Miconazole applied externally while awaiting test results -Follow-up with OB-GYN tomorrow to discuss further management/ possible herpes  -We have sent testing for sexually transmitted infections including yeast. We will notify you of any positive results once they are received. If required, we will prescribe any medications you might need. Please refrain from all sexual activity until treatment is complete.  -Seek additional medical attention if you develop fevers/chills, new/worsening abdominal pain, new/worsening vaginal discomfort/discharge, etc.

## 2021-12-07 NOTE — ED Provider Notes (Signed)
[facility]    CSN: [id number] Arrival date & time: 12/07/21  1348      History   Chief Complaint Chief Complaint  Patient presents with   Rash    HPI Denise Tanner is a 21 y.o. female presenting with rash on the inner thighs and labia.  She is currently [redacted] weeks pregnant.  She was actually evaluated at the MAU 1 day ago on 12/06/2021 for this, they diagnosed her with HSV clinically and prescribed Valtrex.  She has not started the Valtrex as she is not certain their diagnosis was correct.  She continues to have uncomfortable rash around the vagina and inner thighs.  She has tried various over-the-counter creams including Vaseline, and has tried sleeping naked, which has not helped at all.  Of note, she was recently diagnosed with trichomonas on 11/25/2021 and has completed the metronidazole as directed with resolution of this.  She denies current vaginal discharge, vaginal odor, abdominal pain, flank pain, vaginal bleeding, dysuria, hematuria.  States same female partner, denies STI risk.  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 Authorizing Provider  miconazole (MICONAZOLE 7) 2 % vaginal cream Place 1 Applicatorful vaginally at bedtime for 7 days. Apply externally only  12/07/21 12/14/21 Yes Hazel Sams, PA-C  aspirin EC 81 MG tablet Take 1 tablet (81 mg total) by mouth daily. Swallow whole. 09/30/21   Leftwich-Kirby, Kathie Dike, CNM  Blood Pressure Monitoring (BLOOD PRESSURE KIT) DEVI 1 Device by Does not apply route as needed. 09/22/21   Constant, Peggy, MD  Prenatal Vit-Fe Phos-FA-Omega (VITAFOL GUMMIES) 3.33-0.333-34.8 MG CHEW Chew 3 tablets by mouth daily. 09/30/21   Leftwich-Kirby, Kathie Dike, CNM  valACYclovir (VALTREX) 1000 MG tablet Take 1 tablet (1,000 mg total) by mouth 3 (three) times daily for 10 days. 12/06/21 12/16/21  Wende Mott, CNM  ipratropium (ATROVENT) 0.03 % nasal spray Place 2 sprays into both nostrils 2 (two) times daily. Patient not taking: Reported on 03/25/2020 07/16/19 10/21/20  Scot Jun, FNP  medroxyPROGESTERone (DEPO-PROVERA) 150 MG/ML injection Inject 150 mg into the muscle every 3 (three) months.  11/24/18  [provider]  sodium chloride (OCEAN) 0.65 % SOLN nasal spray Place 1 spray into both nostrils as needed for congestion. And to moisturize nose 02/11/19 04/04/19  Zigmund Gottron, NP    Family History Family History  Problem Relation Age of Onset   Sickle cell anemia Mother    Hypothyroidism Mother    Healthy  Father        was killed    Social History Social History   Tobacco Use   Smoking status: Never    Passive exposure: Never   Smokeless tobacco: Never  Vaping Use   Vaping Use: Never used  Substance Use Topics   Alcohol use: No   Drug use: No     Allergies   Patient has no known allergies.   Review of Systems Review of Systems  Genitourinary:  Positive for genital sores.  All other systems reviewed and are negative.    Physical Exam Triage Vital Signs ED Triage Vitals  Enc Vitals Group     BP 12/07/21 1451 109/77     Pulse Rate 12/07/21 1451 88     Resp 12/07/21 1451 18     Temp 12/07/21 1451 99.6 F (37.6 C)     Temp src --      SpO2 12/07/21 1451 99 %     Weight --       Height --      Head Circumference --      Peak Flow --      Pain Score 12/07/21 1450 0     Pain Loc --      Pain Edu? --      Excl. in [place]? --    No data found.  Updated Vital Signs BP 109/77   Pulse 88   Temp 99.6 F (37.6 C)   Resp 18   LMP 06/19/2021 (Exact Date) Comment: neg test at clinic 2/6 ?test  SpO2 99%   Visual Acuity Right Eye Distance:   Left Eye Distance:   Bilateral Distance:    Right Eye Near:   Left Eye Near:    Bilateral Near:     Physical Exam Vitals reviewed.  Constitutional:      General: She is not in acute distress.    Appearance: Normal appearance. She is not ill-appearing.  HENT:     Head: Normocephalic and atraumatic.     Mouth/Throat:     Mouth: Mucous membranes are moist.     Comments: Moist mucous membranes Eyes:     Extraocular Movements: Extraocular movements intact.     Pupils: Pupils are equal, round, and reactive to light.  Cardiovascular:     Rate and Rhythm: Normal rate and regular rhythm.     Heart sounds: Normal heart sounds.  Pulmonary:     Effort: Pulmonary effort is normal.     Breath sounds: Normal breath sounds. No wheezing, rhonchi or rales.  Abdominal:     General: Bowel sounds are normal. There is no distension.     Palpations: Abdomen is soft. There is no mass.     Tenderness: There is no abdominal tenderness. There is no right CVA tenderness, left CVA tenderness, guarding or rebound.  Genitourinary:    Labia:        Right: Rash present.        Left: Rash present.        Comments: Vesicular rash L inner thigh and vesicles R labia. Internal exam not performed. No discharge visualized externally. Skin:    General: Skin is warm.     Capillary Refill: Capillary refill takes less than 2 seconds.     Comments: Good skin turgor  Neurological:     General: No focal deficit present.     Mental Status: She is alert and oriented to person, place, and time.  Psychiatric:  Mood and Affect: Mood normal.         Behavior: Behavior normal.      UC Treatments / Results  Labs (all labs ordered are listed, but only abnormal results are displayed) Labs Reviewed - No data to display  EKG   Radiology No results found.  Procedures Procedures (including critical care time)  Medications Ordered in UC Medications - No data to display  Initial Impression / Assessment and Plan / UC Course  I have reviewed the triage vital signs and the nursing notes.  Pertinent labs & imaging results that were available during my care of the patient were reviewed by me and considered in my medical decision making (see chart for details).     This patient is a very pleasant 21 y.o. year old female presenting with vaginal lesions - suspected HSV. She is [redacted] weeks pregnant. Afebrile, nontachycardic, no reproducible abd pain or CVAT. Was recently treated for trichomoniasis 11/25/21 with metronidazole, which she completed as directed. Was then clinically diagnosed with HSV on 12/06/21 at the MAU.  Has not started the Valtrex as prescribed 1 day ago.  Patient reports that the MAU did collect an HSV swab, but I unfortunately do not see this in the chart yet. It does not appear they collected a cervicovaginal swab, and patient does not think that this was collected. Will send self-swab for G/C, trich, yeast, BV testing. Declines HIV, RPR. Did not collect another HSV swab today. Symptomatic relief with monistat externally while awaiting test results.  We discussed that clinically she does appear to have HSV, and it would probably be safest to start on the Valtrex.  She verbalizes that she prefers to call her OB/GYN tomorrow to discuss this before starting the medication. Safe sex precautions - she states she is monogamous with the same female partner.  ED return precautions discussed. Patient verbalizes understanding and agreement.   Final Clinical Impressions(s) / UC Diagnoses   Final diagnoses:  Vaginitis and vulvovaginitis  [redacted]  weeks gestation of pregnancy     Discharge Instructions      -Miconazole applied externally while awaiting test results -Follow-up with OB-GYN tomorrow to discuss further management/ possible herpes  -We have sent testing for sexually transmitted infections including yeast. We will notify you of any positive results once they are received. If required, we will prescribe any medications you might need. Please refrain from all sexual activity until treatment is complete.  -Seek additional medical attention if you develop fevers/chills, new/worsening abdominal pain, new/worsening vaginal discomfort/discharge, etc.     ED Prescriptions     Medication Sig Dispense Auth. Provider   miconazole (MICONAZOLE 7) 2 % vaginal cream Place 1 Applicatorful vaginally at bedtime for 7 days. Apply externally only 45 g Hazel Sams, PA-C      PDMP not reviewed this encounter.   Hazel Sams, PA-C 12/07/21 1526

## 2021-12-08 ENCOUNTER — Telehealth: Payer: Self-pay | Admitting: Family Medicine

## 2021-12-08 ENCOUNTER — Telehealth: Payer: Self-pay

## 2021-12-08 LAB — CERVICOVAGINAL ANCILLARY ONLY
Bacterial Vaginitis (gardnerella): NEGATIVE
Candida Glabrata: NEGATIVE
Candida Vaginitis: POSITIVE — AB
Chlamydia: NEGATIVE
Comment: NEGATIVE
Comment: NEGATIVE
Comment: NEGATIVE
Comment: NEGATIVE
Comment: NEGATIVE
Comment: NORMAL
Neisseria Gonorrhea: NEGATIVE
Trichomonas: NEGATIVE

## 2021-12-08 NOTE — Telephone Encounter (Signed)
Patient called MAU requesting results of HSV PCR swab. Reviewed chart, in 7/1 MAU note it reported that a HSV PCR was collected but no order was placed.   Called the lab, they stated they do not have any HSV swab in their lab.   Called and informed patient that unfortunately the swab was not sent. Additionally discussed from documentation that her rash was clinically quite consistent with HSV. She does not want to take any medication until she has lab confirmation that this was HSV. She is thinking it may have been from something else as it is already getting better and starting to crust. Discussed that this is also very common with HSV and encouraged valtrex use.   She would like to have a swab still done. Discussed that she can call  [facility] to see if they can get her in or present to MAU if necessary. Provided expectations that if indeed all lesions are already crusted that we may not be even able to get a swab at this point. She endorsed understanding.   Allayne Stack, DO

## 2021-12-10 ENCOUNTER — Telehealth: Payer: Self-pay | Admitting: *Deleted

## 2021-12-10 ENCOUNTER — Telehealth (HOSPITAL_COMMUNITY): Payer: Self-pay | Admitting: Emergency Medicine

## 2021-12-10 MED ORDER — TERCONAZOLE 0.4 % VA CREA: 1.0000 | TOPICAL_CREAM | Freq: Every day | VAGINAL | 0 refills | Status: AC

## 2021-12-10 NOTE — Telephone Encounter (Signed)
Pt called to office with questions about yeast cream, how to use. Discussed and instructed pt on use.

## 2021-12-11 ENCOUNTER — Ambulatory Visit: Payer: Medicaid Other

## 2021-12-23 ENCOUNTER — Encounter: Payer: Self-pay | Admitting: Obstetrics and Gynecology

## 2021-12-23 ENCOUNTER — Other Ambulatory Visit (HOSPITAL_COMMUNITY)
Admission: RE | Admit: 2021-12-23 | Discharge: 2021-12-23 | Disposition: A | Discharge status: Home / Self Care | Payer: Medicaid Other | Source: Ambulatory Visit | Attending: Obstetrics and Gynecology | Admitting: Obstetrics and Gynecology

## 2021-12-23 ENCOUNTER — Ambulatory Visit (INDEPENDENT_AMBULATORY_CARE_PROVIDER_SITE_OTHER): Payer: Medicaid Other | Admitting: Obstetrics and Gynecology

## 2021-12-23 VITALS — BP 106/73 | HR 94 | Wt 171.0 lb

## 2021-12-23 DIAGNOSIS — Z3480 Encounter for supervision of other normal pregnancy, unspecified trimester: Secondary | ICD-10-CM

## 2021-12-23 DIAGNOSIS — Z3482 Encounter for supervision of other normal pregnancy, second trimester: Secondary | ICD-10-CM

## 2021-12-23 DIAGNOSIS — Z3A23 23 weeks gestation of pregnancy: Secondary | ICD-10-CM

## 2021-12-23 NOTE — Progress Notes (Signed)
   PRENATAL VISIT NOTE  Subjective:  Denise Tanner is a 21 y.o. G3P0020 at [redacted]w[redacted]d being seen today for ongoing prenatal care.  She is currently monitored for the following issues for this low-risk pregnancy and has Adjustment disorder with mixed disturbance of emotions and conduct; Encounter for supervision of normal pregnancy, antepartum; and Gonorrhea affecting pregnancy in first trimester on their problem list.  Patient reports no complaints.  Contractions: Not present. Vag. Bleeding: None.  Movement: Present. Denies leaking of fluid.   The following portions of the patient's history were reviewed and updated as appropriate: allergies, current medications, past family history, past medical history, past social history, past surgical history and problem list.   Objective:   Vitals:   12/23/21 1312  BP: 106/73  Pulse: 94  Weight: 171 lb (77.6 kg)    Fetal Status: Fetal Heart Rate (bpm): 160 Fundal Height: 24 cm Movement: Present     General:  Alert, oriented and cooperative. Patient is in no acute distress.  Skin: Skin is warm and dry. No rash noted.   Cardiovascular: Normal heart rate noted  Respiratory: Normal respiratory effort, no problems with respiration noted  Abdomen: Soft, gravid, appropriate for gestational age.  Pain/Pressure: Absent     Pelvic: Cervical exam deferred        Extremities: Normal range of motion.     Mental Status: Normal mood and affect. Normal behavior. Normal judgment and thought content.   Assessment and Plan:  Pregnancy: G3P0020 at [redacted]w[redacted]d 1. Supervision of other normal pregnancy, antepartum Patient is doing well Desires to be tested for resolution of yeast infection and UTI Third trimester labs and glucola next vist Discussed excessive weight gain thus far- Information on nutrition in pregnancy provided - Cervicovaginal ancillary only( [place]) - Culture, OB Urine  Preterm labor symptoms and general obstetric precautions including but not  limited to vaginal bleeding, contractions, leaking of fluid and fetal movement were reviewed in detail with the patient. Please refer to After Visit Summary for other counseling recommendations.   Return in about 4 weeks (around 01/20/2022) for in person, ROB, Low risk, 2 hr glucola next visit.  Future Appointments  Date Time Provider Department Center  12/23/2021  1:30 PM Chet Greenley, Gigi Gin, MD [facility] None    Catalina Antigua, MD

## 2021-12-24 LAB — CERVICOVAGINAL ANCILLARY ONLY
Candida Glabrata: NEGATIVE
Candida Vaginitis: NEGATIVE
Comment: NEGATIVE
Comment: NEGATIVE

## 2021-12-25 LAB — URINE CULTURE, OB REFLEX

## 2021-12-25 LAB — CULTURE, OB URINE

## 2021-12-26 ENCOUNTER — Telehealth: Payer: Self-pay | Admitting: Emergency Medicine

## 2021-12-26 NOTE — Telephone Encounter (Signed)
Attempted RC to patient to disucss. LVM to return call to clinic.

## 2021-12-29 ENCOUNTER — Telehealth: Payer: Self-pay | Admitting: Emergency Medicine

## 2021-12-29 NOTE — Telephone Encounter (Signed)
TC to patient to discuss negative results. No other questions.

## 2021-12-31 NOTE — Progress Notes (Signed)
Patient was assessed and managed by nursing staff during this encounter. I have reviewed the chart and agree with the documentation and plan. I have also made any necessary editorial changes.  Scheryl Darter, MD 12/31/2021 2:22 PM

## 2022-01-11 ENCOUNTER — Inpatient Hospital Stay (HOSPITAL_COMMUNITY)
Admission: AD | Admit: 2022-01-11 | Discharge: 2022-01-11 | Disposition: A | Discharge status: Home / Self Care | Payer: Medicaid Other | Attending: Family Medicine | Admitting: Family Medicine

## 2022-01-11 ENCOUNTER — Encounter (HOSPITAL_COMMUNITY): Payer: Self-pay | Admitting: Family Medicine

## 2022-01-11 DIAGNOSIS — R519 Headache, unspecified: Secondary | ICD-10-CM | POA: Insufficient documentation

## 2022-01-11 DIAGNOSIS — Z3A26 26 weeks gestation of pregnancy: Secondary | ICD-10-CM | POA: Insufficient documentation

## 2022-01-11 DIAGNOSIS — O26892 Other specified pregnancy related conditions, second trimester: Secondary | ICD-10-CM | POA: Insufficient documentation

## 2022-01-11 DIAGNOSIS — Z3689 Encounter for other specified antenatal screening: Secondary | ICD-10-CM

## 2022-01-11 LAB — URINALYSIS, ROUTINE W REFLEX MICROSCOPIC
Bilirubin Urine: NEGATIVE
Glucose, UA: NEGATIVE mg/dL
Hgb urine dipstick: NEGATIVE
Ketones, ur: 20 mg/dL — AB
Leukocytes,Ua: NEGATIVE
Nitrite: NEGATIVE
Protein, ur: NEGATIVE mg/dL
Specific Gravity, Urine: 1.016 (ref 1.005–1.030)
pH: 5 (ref 5.0–8.0)

## 2022-01-11 MED ORDER — ACETAMINOPHEN-CAFFEINE 500-65 MG PO TABS
2.0000 | ORAL_TABLET | Freq: Once | ORAL | Status: AC
  Administered 2022-01-11: 2 via ORAL
  Filled 2022-01-11: qty 2

## 2022-01-11 MED ORDER — PROCHLORPERAZINE MALEATE 10 MG PO TABS
10.0000 mg | ORAL_TABLET | Freq: Once | ORAL | Status: AC
  Administered 2022-01-11: 10 mg via ORAL
  Filled 2022-01-11 (×2): qty 1

## 2022-01-11 NOTE — MAU Provider Note (Signed)
History     CSN: [id number]  Arrival date and time: 01/11/22 0441   Event Date/Time   First Provider Initiated Contact with Patient 01/11/22 0541      Chief Complaint  Patient presents with   Abdominal Pain   Headache   HPI Denise Tanner is a 21 y.o. year old G21P0020 female at 76w0dweeks gestation who presents to MAU via EMS reporting a new onset of RLQ abdominal pain since 0330. She states, "This is my first pregnancy, so I'm not used to this, but I think that is where he was moving. He moves a lot." She reports the abdominal pain has decreased since arriving to MAU. She als reports a dull H/A since 03003 as well. She has not taken any medications for the pain, because she "didn't know what I could take." She denies DFM, UCs, VB or LOF. [facility] next appt is 01/21/2022.   OB History     Gravida  3   Para  0   Term  0   Preterm  0   AB  2   Living  0      SAB  1   IAB  0   Ectopic  0   Multiple  0   Live Births              Past Medical History:  Diagnosis Date   BV (bacterial vaginosis)    Gonorrhea    UTI (urinary tract infection)     Past Surgical History:  Procedure Laterality Date   NO PAST SURGERIES      Family History  Problem Relation Age of Onset   Sickle cell anemia Mother    Hypothyroidism Mother    Healthy Father        was killed    Social History   Tobacco Use   Smoking status: Never    Passive exposure: Never   Smokeless tobacco: Never  Vaping Use   Vaping Use: Never used  Substance Use Topics   Alcohol use: No   Drug use: No    Allergies: No Known Allergies  Medications Prior to Admission  Medication Sig Dispense Refill Last Dose   Prenatal Vit-Fe Phos-FA-Omega (VITAFOL GUMMIES) 3.33-0.333-34.8 MG CHEW Chew 3 tablets by mouth daily. 90 tablet 11 01/10/2022   aspirin EC 81 MG tablet Take 1 tablet (81 mg total) by mouth daily. Swallow whole. 30 tablet 11    Blood Pressure Monitoring (BLOOD  PRESSURE KIT) DEVI 1 Device by Does not apply route as needed. 1 each 0     Review of Systems  Constitutional: Negative.   HENT: Negative.    Eyes: Negative.   Respiratory: Negative.    Cardiovascular: Negative.   Gastrointestinal: Negative.   Endocrine: Negative.   Genitourinary:  Positive for pelvic pain (RLQ).  Musculoskeletal: Negative.   Skin: Negative.   Allergic/Immunologic: Negative.   Neurological:  Positive for headaches.  Hematological: Negative.   Psychiatric/Behavioral: Negative.     Physical Exam   Blood pressure 107/64, pulse 80, temperature 98.1 F (36.7 C), temperature source Oral, resp. rate 16, height 5' 5"  (1.651 m), last menstrual period 06/19/2021, SpO2 100 %.  Physical Exam Vitals and nursing note reviewed.  Constitutional:      Appearance: Normal appearance.  Cardiovascular:     Rate and Rhythm: Normal rate.  Pulmonary:     Effort: Pulmonary effort is normal.  Abdominal:     Palpations: Abdomen is soft.  Genitourinary:    Comments: Not indicated Musculoskeletal:        General: Normal range of motion.  Skin:    General: Skin is warm and dry.  Neurological:     Mental Status: She is alert and oriented to person, place, and time.  Psychiatric:        Mood and Affect: Mood normal.        Behavior: Behavior normal.        Thought Content: Thought content normal.        Judgment: Judgment normal.    REACTIVE NST - FHR: 135 bpm / moderate variability / accels present / decels absent / TOCO: UI noted MAU Course  Procedures  MDM Excedrin Tension H/A po -- improving pain H/A-- now rated a 3/10 Compazine 10 mg po -- no N/V  Results for orders placed or performed during the hospital encounter of 01/11/22 (from the past 24 hour(s))  Urinalysis, Routine w reflex microscopic     Status: Abnormal   Collection Time: 01/11/22  4:55 AM  Result Value Ref Range   Color, Urine YELLOW YELLOW   APPearance HAZY (A) CLEAR   Specific Gravity, Urine 1.016  1.005 - 1.030   pH 5.0 5.0 - 8.0   Glucose, UA NEGATIVE NEGATIVE mg/dL   Hgb urine dipstick NEGATIVE NEGATIVE   Bilirubin Urine NEGATIVE NEGATIVE   Ketones, ur 20 (A) NEGATIVE mg/dL   Protein, ur NEGATIVE NEGATIVE mg/dL   Nitrite NEGATIVE NEGATIVE   Leukocytes,Ua NEGATIVE NEGATIVE    Assessment and Plan  Headache in pregnancy, antepartum, second trimester  - Information provided on safe meds in pregnancy list, general H/A w/o cause and form to track H/As   NST (non-stress test) reactive - Reassurance given that baby's well-being is normal on the monitor - Category 1 FHR tracing  [redacted] weeks gestation of pregnancy   - Discharge patient - Keep scheduled appt with [facility] on 01/21/2022 - Patient verbalized an understanding of the plan of care and agrees.   Laury Deep, CNM 01/11/2022, 5:42 AM

## 2022-01-11 NOTE — Discharge Instructions (Signed)

## 2022-01-11 NOTE — MAU Note (Signed)
.  Denise Tanner is a 21 y.o. at [redacted]w[redacted]d here in MAU reporting: arriving via EMS with newly onset sharp ABD on right lower side that started around 0330 and a HA for the last 3 hours. Pt reports she did not take any pain medication. Pt stated the ABD pain was sudden and is currently dull but still there. PT denies DFM, VB, LOF, rt epigastric pain or vision changes, and complications in the pregnancy.  Onset of complaint: 0100 Pain score: 7/10 HA, 5/10 ABD Vitals:   01/11/22 0450  BP: 103/67  Pulse: 81  Resp: 16  Temp: 98.1 F (36.7 C)  SpO2: 100%     FHT:138 Lab orders placed from triage:  UA

## 2022-01-21 ENCOUNTER — Other Ambulatory Visit: Payer: Medicaid Other

## 2022-01-21 ENCOUNTER — Ambulatory Visit (INDEPENDENT_AMBULATORY_CARE_PROVIDER_SITE_OTHER): Payer: Medicaid Other | Admitting: Student

## 2022-01-21 VITALS — BP 122/75 | HR 95 | Wt 175.0 lb

## 2022-01-21 DIAGNOSIS — Z3A27 27 weeks gestation of pregnancy: Secondary | ICD-10-CM

## 2022-01-21 DIAGNOSIS — Z3482 Encounter for supervision of other normal pregnancy, second trimester: Secondary | ICD-10-CM

## 2022-01-21 DIAGNOSIS — Z3480 Encounter for supervision of other normal pregnancy, unspecified trimester: Secondary | ICD-10-CM

## 2022-01-21 NOTE — Progress Notes (Signed)
Pt reports fetal movement, denies pain.  

## 2022-01-21 NOTE — Patient Instructions (Signed)
AREA FAMILY PRACTICE PHYSICIANS  [place] ([address]) [facility] [address] [place], [place] [address] [phone] Mon-Fri 8:30-12:30, 1:30-5:00 Accepting Medicaid [facility] [address], [place], [place] [address] [phone] Mon-Fri 8:00-5:30 [facility] [address] [place], [place] [address] [phone] Mon, Tue, Thur, Fri 8:30-5:00, Wed 10:00-7:00 (closed 1-2pm) Accepting Medicaid [facility] [address], [place], [place]  [address] Phone - [phone]   Fax - [phone]  [place] ([address]) [facility] [address] [place], [place] [address] [phone] Mon-Fri 8:00-5:00 [facility] ([facility])  [address] [place], [place] [address] [phone] Mon-Fri 8:30-5:30, Sat (Oct.-Mar.) 9:00-1:00 Accepting Medicaid  [place] ([address]) [facility] [address], [place], [place] [address] [phone] Mon-Fri 8:00-5:00  [place] ([address]) [facility] [address], [place], [place] [address] [phone] Mon-Fri 8:00-5:00 [facility] [address], [place], [place] [address] [phone] Mon-Fri 8:00-5:00 [facility] [address] [place], [place] [address] [phone] Mon-Fri 8:00-5:00 [facility] [address] [place] [address] [phone] Mon-Fri 7:30-5:30  [place] ([address] & [address]) [facility] [address] [place], [place] [address] [phone] Mon-Thur 8:00-6:00 Accepting Medicaid [facility] [address] [place], [place] [address] [phone] Mon-Thur 7:30-7:30, Fri 7:30-4:30 Accepting  Medicaid [facility] [address], [place], [place]  [address] [phone]   Fax - [phone]  [place] ([address] & [address]) [facility] [address] [place], [place] [address] [phone] Mon-Fri 7:00-5:00 [facility] [address], [place], [place] [address] [phone] Mon-Fri 8:00-5:00 Accepting Medicaid [facility] [address], , [place] [address] [phone] Mon-Fri 8:00-5:00 Accepting Medicaid  [place] ([address]) [facility] [place] [address] [place], [place] [address] [phone] Mon-Fri 8:00-5:00 [facility] [address], [place], [place] [address] [phone] Mon-Fri 8:00-5:00 Accepting Medicaid [facility] ([facility]) [address], [place], [place] [address] [phone] Mon-Fri 8:00-5:30, Sat&Sun by appointment (phones open at 8:30) Accepting Medicaid  [place] ([address]) [facility] [address] [place], [place] [address] [phone] Mon-Thur 8:00-7:00, Fri 8:00-5:00, Sat 8:00-12:00, Sun 9:00-12:00 Accepting Medicaid [facility] [place] [address], [place], [place] [address] [phone] Mon-Thur 8:00-5:00 Accepting Medicaid [facility] [address] [place], [place] [address] [phone] Mon-Fri 8:00-5:30, Sat (Oct.-Mar.) 9:00-1:00 Accepting Medicaid  [place] ([address]) [facility] [address], [place], [place] [address] [phone] Mon-Fri 8:00-5:00 Accepting Medicaid   [place] ([address]) [facility] [place] [address], [place], [place] [address] [phone] Mon-Fri 8:00-5:00 [facility] [place] [address], [place], [place] [address] [phone] Mon-Fri 8:00-5:00 [facility] [place] [address] [place] [address], [place],  [address] [phone] Mon-Fri 8:00-5:00 After hours clinic ([address] [place],  [address]) [phone] Mon-Fri 5:00-8:00, Sat 12:00-6:00, Sun 10:00-4:00 Accepting Medicaid [facility] [place]   [address], [place], [place]  [address] [phone]   Fax - [phone]  [place] ([address]) [facility] [place] [address], [place], [place] [address] [phone] Mon-Fri 8:00-5:00 [facility] [place] ([facility] [place]) [address], [place], [place] [address] [phone] Mon-Thur 8:00-7:00, Fri 8:00-5:00, Sat 8:00-12:00    

## 2022-01-21 NOTE — Progress Notes (Signed)
   PRENATAL VISIT NOTE  Subjective:  Denise Tanner is a 21 y.o. G3P0020 at [redacted]w[redacted]d being seen today for ongoing prenatal care.  She is currently monitored for the following issues for this low-risk pregnancy and has Adjustment disorder with mixed disturbance of emotions and conduct; Encounter for supervision of normal pregnancy, antepartum; and Gonorrhea affecting pregnancy in first trimester on their problem list.  Patient reports no complaints. No longer has headache from MAU visit last week. Contractions: Not present. Vag. Bleeding: None.  Movement: Present. Denies leaking of fluid.   The following portions of the patient's history were reviewed and updated as appropriate: allergies, current medications, past family history, past medical history, past social history, past surgical history and problem list.   Objective:   Vitals:   01/21/22 0915  BP: 122/75  Pulse: 95  Weight: 175 lb (79.4 kg)    Fetal Status: Fetal Heart Rate (bpm): 140 Fundal Height: 28 cm Movement: Present     General:  Alert, oriented and cooperative. Patient is in no acute distress.  Skin: Skin is warm and dry. No rash noted.   Cardiovascular: Normal heart rate noted  Respiratory: Normal respiratory effort, no problems with respiration noted  Abdomen: Soft, gravid, appropriate for gestational age.  Pain/Pressure: Absent     Pelvic: Cervical exam deferred        Extremities: Normal range of motion.  Edema: Trace  Mental Status: Normal mood and affect. Normal behavior. Normal judgment and thought content.   Assessment and Plan:  Pregnancy: G3P0020 at [redacted]w[redacted]d 1. Supervision of other normal pregnancy, antepartum - Doing well, reports vigorous fetal movement - Glucose Tolerance, 2 Hours w/1 Hour - RPR - HIV antibody (with reflex) - CBC   2. [redacted] weeks gestation of pregnancy  - Glucose Tolerance, 2 Hours w/1 Hour - RPR - HIV antibody (with reflex) - CBC  Preterm labor symptoms and general obstetric  precautions including but not limited to vaginal bleeding, contractions, leaking of fluid and fetal movement were reviewed in detail with the patient. Please refer to After Visit Summary for other counseling recommendations.   Return in about 2 weeks (around 02/04/2022) for LOB, IN-PERSON.  Future Appointments  Date Time Provider Department Center  02/03/2022  8:55 AM Leftwich-Kirby, Wilmer Floor, CNM [facility] None  02/19/2022 10:35 AM Adam Phenix, MD [facility] None  03/05/2022 10:35 AM Warden Fillers, MD [facility] None  03/26/2022 10:35 AM Hermina Staggers, MD [facility] None  04/02/2022 10:35 AM Gerrit Heck, CNM [facility] None  04/07/2022 10:35 AM Adam Phenix, MD [facility] None    Corlis Hove, NP

## 2022-01-22 LAB — CBC
Hematocrit: 36.4 % (ref 34.0–46.6)
Hemoglobin: 12 g/dL (ref 11.1–15.9)
MCH: 27.8 pg (ref 26.6–33.0)
MCHC: 33 g/dL (ref 31.5–35.7)
MCV: 85 fL (ref 79–97)
Platelets: 233 10*3/uL (ref 150–450)
RBC: 4.31 x10E6/uL (ref 3.77–5.28)
RDW: 12.6 % (ref 11.7–15.4)
WBC: 12.8 10*3/uL — ABNORMAL HIGH (ref 3.4–10.8)

## 2022-01-22 LAB — HIV ANTIBODY (ROUTINE TESTING W REFLEX): HIV Screen 4th Generation wRfx: NONREACTIVE

## 2022-01-22 LAB — GLUCOSE TOLERANCE, 2 HOURS W/ 1HR
Glucose, 1 hour: 90 mg/dL (ref 70–179)
Glucose, 2 hour: 93 mg/dL (ref 70–152)
Glucose, Fasting: 74 mg/dL (ref 70–91)

## 2022-01-22 LAB — RPR: RPR Ser Ql: NONREACTIVE

## 2022-02-03 ENCOUNTER — Encounter: Payer: Medicaid Other | Admitting: Advanced Practice Midwife

## 2022-02-06 ENCOUNTER — Ambulatory Visit: Payer: Medicaid Other | Admitting: Family Medicine

## 2022-02-06 VITALS — BP 117/68 | HR 79 | Wt 178.4 lb

## 2022-02-06 DIAGNOSIS — Z3480 Encounter for supervision of other normal pregnancy, unspecified trimester: Secondary | ICD-10-CM

## 2022-02-06 NOTE — Progress Notes (Signed)
Pt presents for routine prenatal visit.

## 2022-02-06 NOTE — Progress Notes (Signed)
Left without seeing the provider

## 2022-02-19 ENCOUNTER — Ambulatory Visit (INDEPENDENT_AMBULATORY_CARE_PROVIDER_SITE_OTHER): Payer: Medicaid Other | Admitting: Obstetrics and Gynecology

## 2022-02-19 ENCOUNTER — Encounter: Payer: Self-pay | Admitting: Obstetrics and Gynecology

## 2022-02-19 VITALS — BP 107/70 | HR 103 | Wt 179.9 lb

## 2022-02-19 DIAGNOSIS — Z3483 Encounter for supervision of other normal pregnancy, third trimester: Secondary | ICD-10-CM

## 2022-02-19 DIAGNOSIS — Z3480 Encounter for supervision of other normal pregnancy, unspecified trimester: Secondary | ICD-10-CM

## 2022-02-19 DIAGNOSIS — Z3A31 31 weeks gestation of pregnancy: Secondary | ICD-10-CM

## 2022-02-19 NOTE — Progress Notes (Signed)
   PRENATAL VISIT NOTE  Subjective:  Denise Tanner is a 21 y.o. G3P0020 at [redacted]w[redacted]d being seen today for ongoing prenatal care.  She is currently monitored for the following issues for this low-risk pregnancy and has Adjustment disorder with mixed disturbance of emotions and conduct; Encounter for supervision of normal pregnancy, antepartum; and Gonorrhea affecting pregnancy in first trimester on their problem list.  Patient reports no complaints.  Contractions: Not present. Vag. Bleeding: None.  Movement: Present. Denies leaking of fluid.   The following portions of the patient's history were reviewed and updated as appropriate: allergies, current medications, past family history, past medical history, past social history, past surgical history and problem list.   Objective:   Vitals:   02/19/22 1053  BP: 107/70  Pulse: (!) 103  Weight: 179 lb 14.4 oz (81.6 kg)    Fetal Status: Fetal Heart Rate (bpm): 146 Fundal Height: 32 cm Movement: Present     General:  Alert, oriented and cooperative. Patient is in no acute distress.  Skin: Skin is warm and dry. No rash noted.   Cardiovascular: Normal heart rate noted  Respiratory: Normal respiratory effort, no problems with respiration noted  Abdomen: Soft, gravid, appropriate for gestational age.  Pain/Pressure: Present     Pelvic: Cervical exam deferred        Extremities: Normal range of motion.  Edema: None  Mental Status: Normal mood and affect. Normal behavior. Normal judgment and thought content.   Assessment and Plan:  Pregnancy: G3P0020 at [redacted]w[redacted]d 1. Supervision of other normal pregnancy, antepartum Patient is doing well without complaints Plans to use [facility] Does not plan on using contraception  Preterm labor symptoms and general obstetric precautions including but not limited to vaginal bleeding, contractions, leaking of fluid and fetal movement were reviewed in detail with the patient. Please refer to After Visit Summary for  other counseling recommendations.   Return in about 2 weeks (around 03/05/2022) for in person, ROB, Low risk.  Future Appointments  Date Time Provider Department Center  03/05/2022 10:35 AM Warden Fillers, MD [facility] None  03/26/2022 10:35 AM Hermina Staggers, MD [facility] None  04/02/2022 10:35 AM Gerrit Heck, CNM [facility] None  04/07/2022 10:35 AM Reva Bores, MD [facility] None    Catalina Antigua, MD

## 2022-02-19 NOTE — Progress Notes (Signed)
Pt presents for ROB visit. Pt has questions about possible 3rd trimester [place] to check on baby's growth. No other concerns at this time.

## 2022-03-05 ENCOUNTER — Ambulatory Visit (INDEPENDENT_AMBULATORY_CARE_PROVIDER_SITE_OTHER): Payer: Medicaid Other | Admitting: Obstetrics and Gynecology

## 2022-03-05 VITALS — BP 100/67 | HR 110 | Wt 185.0 lb

## 2022-03-05 DIAGNOSIS — Z3483 Encounter for supervision of other normal pregnancy, third trimester: Secondary | ICD-10-CM

## 2022-03-05 DIAGNOSIS — Q831 Accessory breast: Secondary | ICD-10-CM | POA: Insufficient documentation

## 2022-03-05 DIAGNOSIS — Z3A33 33 weeks gestation of pregnancy: Secondary | ICD-10-CM

## 2022-03-05 DIAGNOSIS — Z3480 Encounter for supervision of other normal pregnancy, unspecified trimester: Secondary | ICD-10-CM

## 2022-03-05 NOTE — Progress Notes (Signed)
Pt has area of concern under right arm/breast area.

## 2022-03-05 NOTE — Progress Notes (Signed)
   PRENATAL VISIT NOTE  Subjective:  Denise Tanner is a 21 y.o. G3P0020 at [redacted]w[redacted]d being seen today for ongoing prenatal care.  She is currently monitored for the following issues for this low-risk pregnancy and has Adjustment disorder with mixed disturbance of emotions and conduct; Encounter for supervision of normal pregnancy, antepartum; Gonorrhea affecting pregnancy in first trimester; and Accessory breast tissue of axilla on their problem list.  Patient doing well with no acute concerns today. She reports no complaints.  Contractions: Not present. Vag. Bleeding: None.  Movement: Present. Denies leaking of fluid.   Pt was concerned about a soft tissue mass in her right axilla.  Per pt she said it was present prior to pregnancy, but that it got a little bigger and more tender after she was pregnant.  Upon exam it appears to be accessory breast tissue in the axilla which has responded to pregnancy hormones.  The following portions of the patient's history were reviewed and updated as appropriate: allergies, current medications, past family history, past medical history, past social history, past surgical history and problem list. Problem list updated.  Objective:   Vitals:   03/05/22 1057  BP: 100/67  Pulse: (!) 110  Weight: 185 lb (83.9 kg)    Fetal Status: Fetal Heart Rate (bpm): 152 Fundal Height: 33 cm Movement: Present     General:  Alert, oriented and cooperative. Patient is in no acute distress.  Skin: Skin is warm and dry. No rash noted.   Cardiovascular: Normal heart rate noted  Respiratory: Normal respiratory effort, no problems with respiration noted  Abdomen: Soft, gravid, appropriate for gestational age.  Pain/Pressure: Absent     Pelvic: Cervical exam deferred        Extremities: Normal range of motion.     Mental Status:  Normal mood and affect. Normal behavior. Normal judgment and thought content.   Assessment and Plan:  Pregnancy: [id number] at [redacted]w[redacted]d  1.  Supervision of other normal pregnancy, antepartum Continue routine prenatal care  2. [redacted] weeks gestation of pregnancy   3. Accessory breast tissue of axilla Expectant management until after pregnancy to see if it decreases in size  Preterm labor symptoms and general obstetric precautions including but not limited to vaginal bleeding, contractions, leaking of fluid and fetal movement were reviewed in detail with the patient.  Please refer to After Visit Summary for other counseling recommendations.   Return in about 2 weeks (around 03/19/2022) for ROB, in person.   Lynnda Shields, MD Faculty Attending [facility] for [facility]

## 2022-03-23 ENCOUNTER — Encounter (HOSPITAL_COMMUNITY): Payer: Self-pay | Admitting: Obstetrics & Gynecology

## 2022-03-23 ENCOUNTER — Inpatient Hospital Stay (HOSPITAL_COMMUNITY)
Admission: AD | Admit: 2022-03-23 | Discharge: 2022-03-23 | Disposition: A | Discharge status: Home / Self Care | Payer: Medicaid Other | Attending: Obstetrics & Gynecology | Admitting: Obstetrics & Gynecology

## 2022-03-23 DIAGNOSIS — Z3689 Encounter for other specified antenatal screening: Secondary | ICD-10-CM

## 2022-03-23 DIAGNOSIS — O4700 False labor before 37 completed weeks of gestation, unspecified trimester: Secondary | ICD-10-CM

## 2022-03-23 DIAGNOSIS — O4703 False labor before 37 completed weeks of gestation, third trimester: Secondary | ICD-10-CM | POA: Diagnosis present

## 2022-03-23 DIAGNOSIS — Z3A36 36 weeks gestation of pregnancy: Secondary | ICD-10-CM | POA: Diagnosis not present

## 2022-03-23 NOTE — MAU Provider Note (Signed)
S: Denise Tanner is a 21 y.o. G3P0020 at [redacted]w[redacted]d  who presents to MAU today for labor evaluation.   Nurse reports patient cervix closed, but strip initially not reactive.  Nurse now reports reactivity with position change.   Cervical exam by RN:  Dilation: Closed Presentation: Undeterminable Exam by:: F. Morris, RNC  Fetal Monitoring: Baseline: 135 Variability: Moderate Accelerations: Present 15x15 Decelerations: None Contractions: Irregular with Irritability  MDM Discussed patient with RN. NST reviewed.   A: SIUP at [redacted]w[redacted]d  False labor  Cat I FT  P: Discharge home Labor precautions and kick counts included in AVS Patient to follow-up with primary office as scheduled  Patient may return to MAU as needed or when in labor   [name], [place] 03/23/2022 8:21 PM

## 2022-03-23 NOTE — MAU Note (Signed)
...  Denise Tanner is a 21 y.o. at [redacted]w[redacted]d here in MAU reporting: Around 1639 she was cleaning the kitchen and felt a sharp shooting pain from her vagina to her buttock region as well as right sided lower abdominal pains that are also sharp. She reports these pains are worse with movement and she tried to relieve her pain by showering but it did not help. She reports over the past hour it has happened 3-4 times. Denies VB or LOF. +FM.  Onset of complaint: 1639 Pain score:  7/10 vagina to buttocks 7/10 right sided lower abdomen  FHT: 154 doppler Lab orders placed from triage: MAU Labor

## 2022-03-26 ENCOUNTER — Encounter: Payer: Medicaid Other | Admitting: Obstetrics and Gynecology

## 2022-03-27 ENCOUNTER — Inpatient Hospital Stay (HOSPITAL_COMMUNITY)
Admission: AD | Admit: 2022-03-27 | Discharge: 2022-03-28 | Disposition: A | Discharge status: Home / Self Care | Payer: Medicaid Other | Attending: Obstetrics & Gynecology | Admitting: Obstetrics & Gynecology

## 2022-03-27 ENCOUNTER — Encounter (HOSPITAL_COMMUNITY): Payer: Self-pay | Admitting: Obstetrics & Gynecology

## 2022-03-27 ENCOUNTER — Ambulatory Visit (INDEPENDENT_AMBULATORY_CARE_PROVIDER_SITE_OTHER): Payer: Medicaid Other

## 2022-03-27 ENCOUNTER — Other Ambulatory Visit (HOSPITAL_COMMUNITY)
Admission: RE | Admit: 2022-03-27 | Discharge: 2022-03-27 | Disposition: A | Discharge status: Home / Self Care | Payer: Medicaid Other | Source: Ambulatory Visit | Attending: Obstetrics and Gynecology | Admitting: Obstetrics and Gynecology

## 2022-03-27 VITALS — BP 123/76 | HR 92 | Wt 188.8 lb

## 2022-03-27 DIAGNOSIS — O4703 False labor before 37 completed weeks of gestation, third trimester: Secondary | ICD-10-CM | POA: Diagnosis present

## 2022-03-27 DIAGNOSIS — Z3A36 36 weeks gestation of pregnancy: Secondary | ICD-10-CM | POA: Insufficient documentation

## 2022-03-27 DIAGNOSIS — Z3483 Encounter for supervision of other normal pregnancy, third trimester: Secondary | ICD-10-CM

## 2022-03-27 DIAGNOSIS — Z3480 Encounter for supervision of other normal pregnancy, unspecified trimester: Secondary | ICD-10-CM

## 2022-03-27 DIAGNOSIS — Z3689 Encounter for other specified antenatal screening: Secondary | ICD-10-CM

## 2022-03-27 LAB — POCT URINALYSIS DIPSTICK
Bilirubin, UA: NEGATIVE
Blood, UA: NEGATIVE
Glucose, UA: NEGATIVE
Ketones, UA: NEGATIVE
Nitrite, UA: NEGATIVE
Protein, UA: NEGATIVE
Spec Grav, UA: 1.01 (ref 1.010–1.025)
Urobilinogen, UA: 0.2 E.U./dL
pH, UA: 6 (ref 5.0–8.0)

## 2022-03-27 NOTE — Progress Notes (Signed)
LOW-RISK PREGNANCY OFFICE VISIT  Patient name: Denise Tanner MRN [id number]  Date of birth: 01-11-2001 Chief Complaint:   Routine Prenatal Visit  Subjective:   Denise Tanner is a 21 y.o. G41P0020 female at [redacted]w[redacted]d with an Estimated Date of Delivery: 04/19/22 being seen today for ongoing management of a low-risk pregnancy aeb has Adjustment disorder with mixed disturbance of emotions and conduct; Encounter for supervision of normal pregnancy, antepartum; Gonorrhea affecting pregnancy in first trimester; and Accessory breast tissue of axilla on their problem list.  Patient presents today with, her mother, and has no complaints.  Patient endorses fetal movement. Patient denies abdominal cramping or contractions.  Patient denies vaginal concerns including abnormal discharge, leaking of fluid, and bleeding.  Contractions: Not present. Vag. Bleeding: None.  Movement: Present.  Reviewed past medical,surgical, social, obstetrical and family history as well as problem list, medications and allergies.  Objective   Vitals:   03/27/22 1040  BP: 123/76  Pulse: 92  Weight: 188 lb 12.8 oz (85.6 kg)  Body mass index is 31.42 kg/m.  Total Weight Gain:38 lb 12.8 oz (17.6 kg)         Physical Examination:   General appearance: Well appearing, and in no distress  Mental status: Alert, oriented to person, place, and time  Skin: Warm & dry  Cardiovascular: Normal heart rate noted  Respiratory: Normal respiratory effort, no distress  Abdomen: Soft, gravid, nontender, AGA with Fundal Height: 36 cm  Pelvic: Cervical exam performed  Dilation: Closed Effacement (%): Thick      Extremities: Edema: None  Fetal Status: Fetal Heart Rate (bpm): 145  Movement: Present   Results for orders placed or performed in visit on 03/27/22 (from the past 24 hour(s))  POCT urinalysis dipstick   Collection Time: 03/27/22 10:55 AM  Result Value Ref Range   Color, UA Amber    Clarity, UA Hazy    Glucose, UA  Negative Negative   Bilirubin, UA Negative    Ketones, UA Negative    Spec Grav, UA 1.010 1.010 - 1.025   Blood, UA Negative    pH, UA 6.0 5.0 - 8.0   Protein, UA Negative Negative   Urobilinogen, UA 0.2 0.2 or 1.0 E.U./dL   Nitrite, UA Negative    Leukocytes, UA Small (1+) (A) Negative   Appearance     Odor      Assessment & Plan:  Low-risk pregnancy of a 21 y.o., [id number] at [redacted]w[redacted]d with an Estimated Date of Delivery: 04/19/22   1. Supervision of other normal pregnancy, antepartum -Anticipatory guidance for upcoming appts. -Patient to schedule next appt in one weeks for an in-person visit. -Requested Urine dip, but no symptoms. Dip Negative.    2. [redacted] weeks gestation of pregnancy -Doing well overall. -Labs as below. -Educated on GBS bacteria including what it is, why we test, and how and when we treat if needed. -Discussed releasing of results to mychart. -Discussed and reviewed postpartum planning including contraception, pediatricians, and infant feedings and circumcision desires. *Patient desires to breastfeed and is considering no method for contraception.       Meds: No orders of the defined types were placed in this encounter.  Labs/procedures today: Lab Orders         Strep Gp B NAA         POCT urinalysis dipstick      Reviewed: Preterm labor symptoms and general obstetric precautions including but not limited to vaginal bleeding, contractions, leaking of fluid and  fetal movement were reviewed in detail with the patient.  All questions were answered.  Follow-up: Return in about 1 week (around 04/03/2022).  Orders Placed This Encounter  Procedures   Strep Gp B NAA   POCT urinalysis dipstick   Maryann Conners MSN, CNM 03/27/2022

## 2022-03-27 NOTE — MAU Provider Note (Signed)
S: [name] is a 21 y.o. G3P0020 at [redacted]w[redacted]d  who presents to MAU today for labor evaluation.   Nurse reports patient with no cervical change after evaluation. Requests review of strip and discharge.   Cervical exam by RN:  Dilation: Closed Effacement (%): Thick Cervical Position: Posterior Exam by:: Verdie Shire, RN  Fetal Monitoring: Baseline: 130 Variability: Moderate Accelerations: Present 15x15 Decelerations: None Contractions: Every 2-30min  MDM Discussed patient with RN. NST reviewed.   A: SIUP at [redacted]w[redacted]d  False labor Cat I FT  P: -Discharge home -Labor precautions and kick counts included in AVS -Patient to follow-up with primary office as scheduled  -Patient may return to MAU as needed or when in labor   [name], [place] 03/27/2022 11:57 PM

## 2022-03-27 NOTE — MAU Note (Signed)
Pt says she was here on Wed - with UC- VE - closed  Tonight with to [place] - collected GBS VE-closed , collected Urine  At 6pm- washing dishes- started UC Came by EMS for like UC strong and no ride here

## 2022-03-27 NOTE — Progress Notes (Signed)
Pt presents for ROB visit. No concerns at this time.  

## 2022-03-29 LAB — CULTURE, OB URINE

## 2022-03-29 LAB — STREP GP B NAA: Strep Gp B NAA: NEGATIVE

## 2022-03-30 ENCOUNTER — Telehealth: Payer: Self-pay | Admitting: Emergency Medicine

## 2022-03-30 ENCOUNTER — Other Ambulatory Visit: Payer: Self-pay | Admitting: Emergency Medicine

## 2022-03-30 LAB — CERVICOVAGINAL ANCILLARY ONLY
Chlamydia: NEGATIVE
Comment: NEGATIVE
Comment: NEGATIVE
Comment: NORMAL
Neisseria Gonorrhea: NEGATIVE
Trichomonas: NEGATIVE

## 2022-03-30 IMAGING — US US OB TRANSVAGINAL
1 series · 14 of 28 positions shown · non-contrast
Comparison: 08/14/2021

CLINICAL DATA: Pregnancy of unknown location.

EXAM:
TRANSVAGINAL OB ULTRASOUND
TECHNIQUE: Transvaginal ultrasound was performed for complete evaluation of the
gestation as well as the maternal uterus, adnexal regions, and
pelvic cul-de-sac.

[Series 1: us ob transvaginal · 57 acquisitions, 14 frames shown]
[im 3/57]
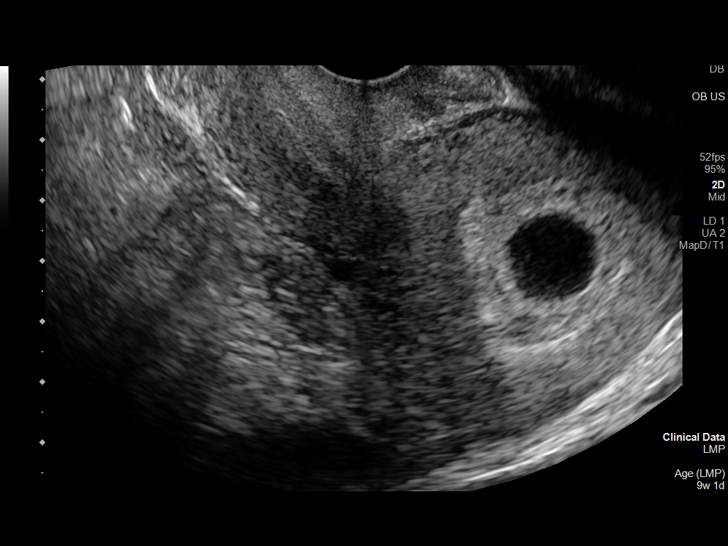
[im 7/57]
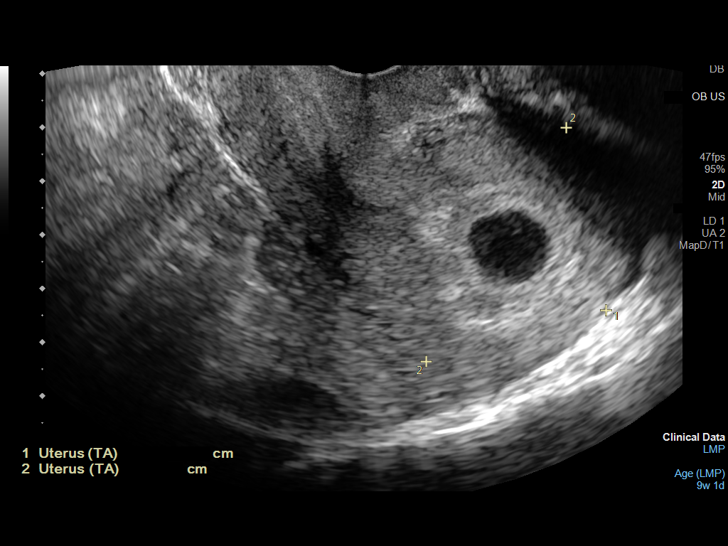
[im 11/57]
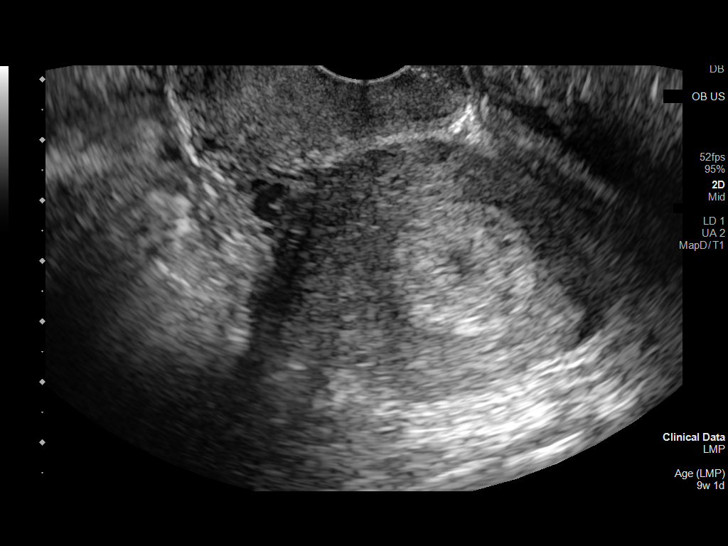
[im 15/57]
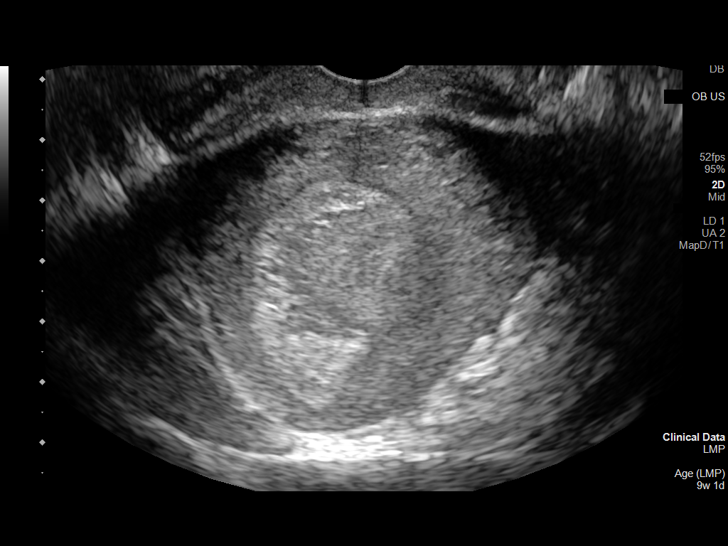
[im 19/57]
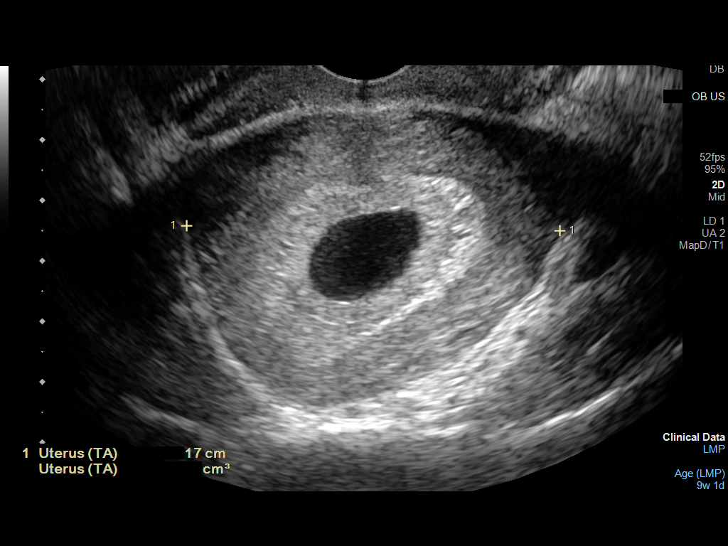
[im 23/57]
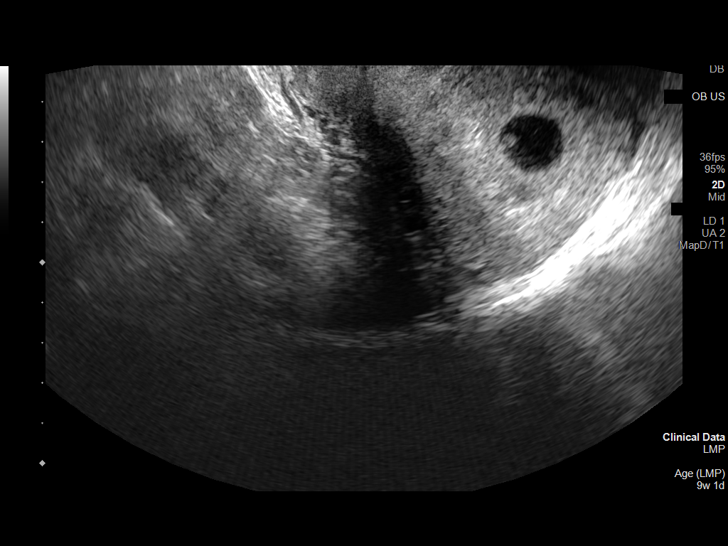
[im 27/57]
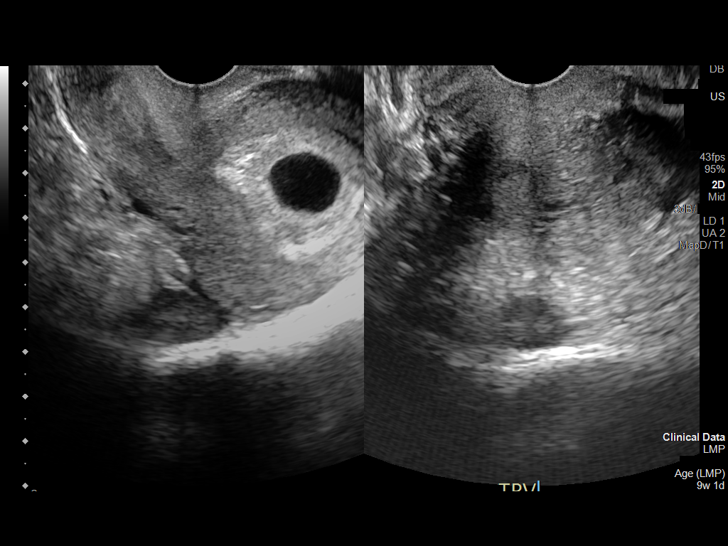
[im 32/57]
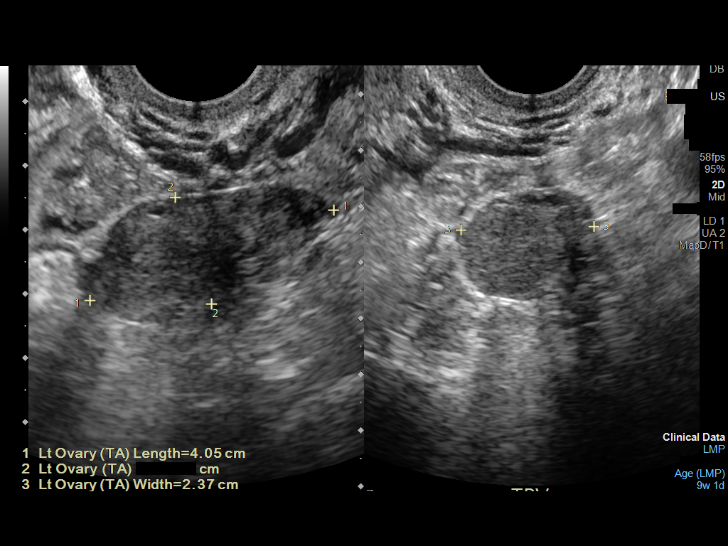
[im 36/57]
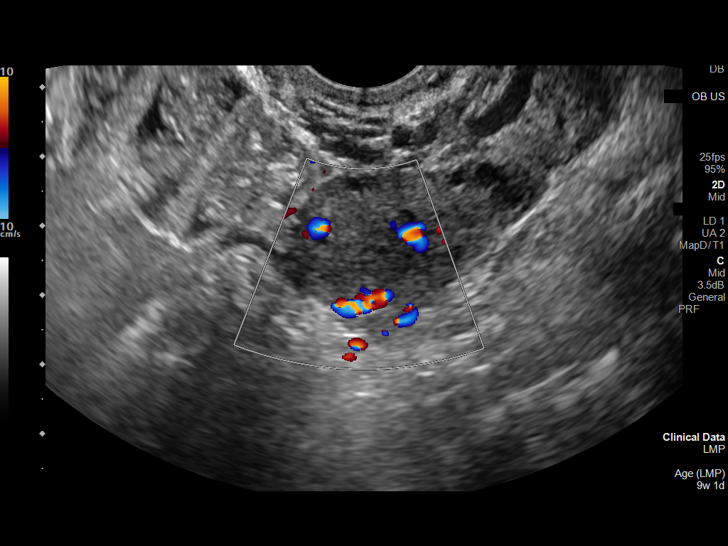
[im 40/57]
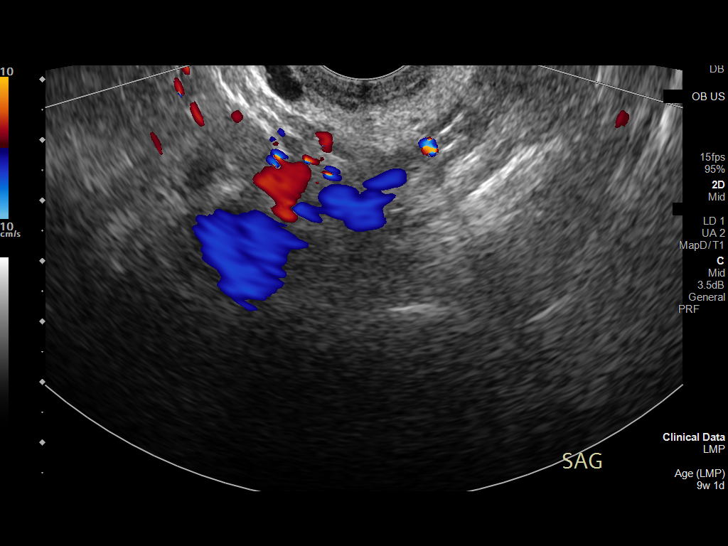
[im 44/57]
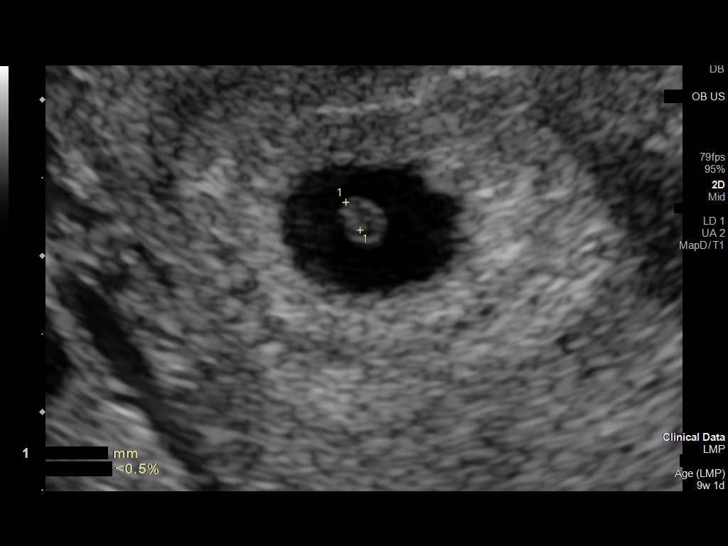
[im 48/57]
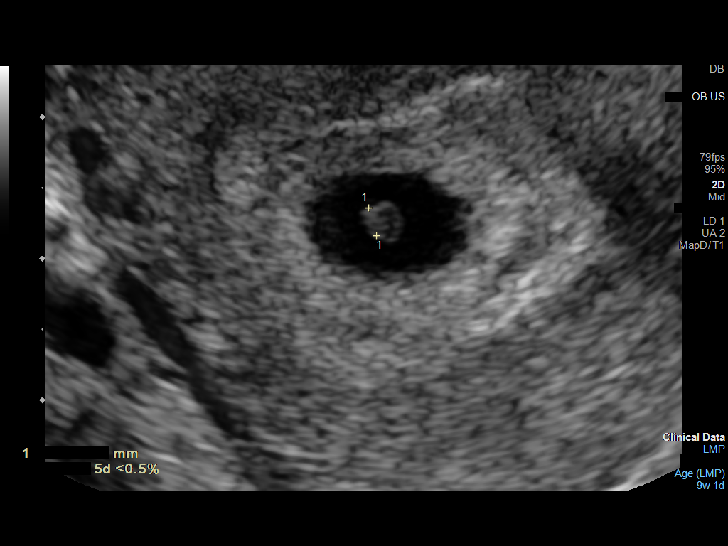
[im 52/57]
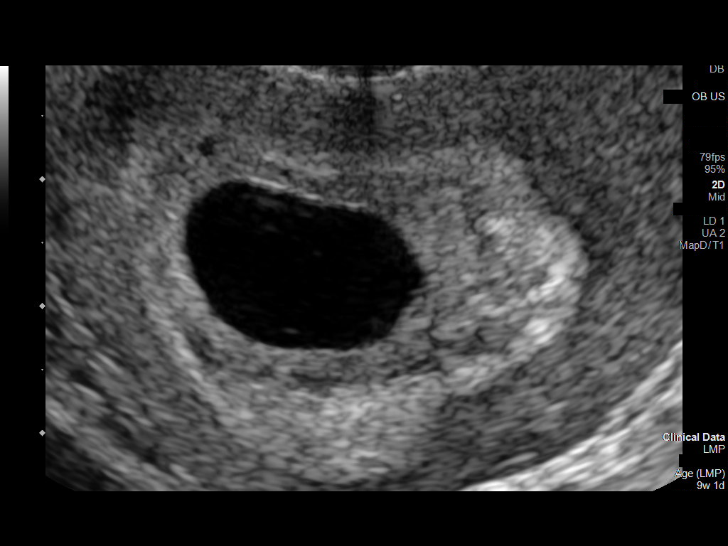
[im 57/57]
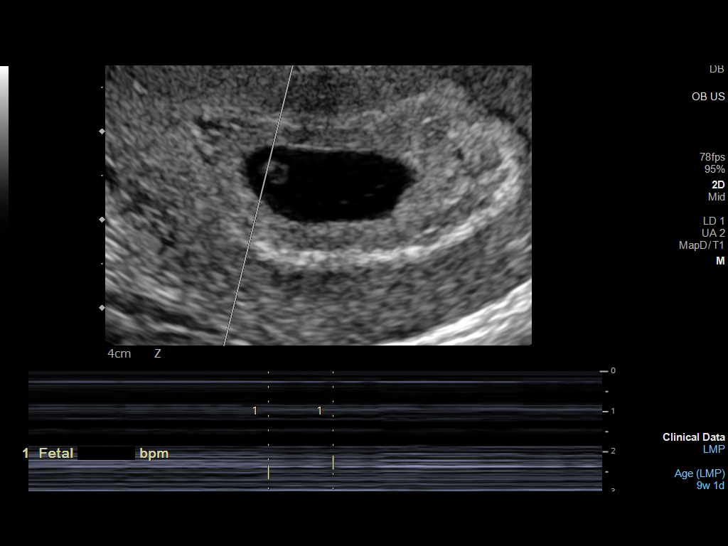

[14 of 28 positions shown; findings below may reference images not displayed]

FINDINGS: Intrauterine gestational sac: Single

Yolk sac:  Yes

Embryo:  Yes

Cardiac Activity: Yes

Heart Rate: 158 bpm

MSD:   mm    w     d

CRL:   2.04 mm   5 w 5 5 d                  US EDC: 04/19/2022.

Subchorionic hemorrhage:  None visualized.

Maternal uterus/adnexae: The right ovary is within normal limits. A
1.8 x 1.5 x 1.7 cm complex hypoechoic lesion is noted in the left
ovary, likely corpus luteal cyst. A small amount of free fluid is
noted in the cul-de-sac.
IMPRESSION: 1. Single live intrauterine pregnancy with estimated gestational age
of 5 weeks 5 days. EDC: 04/19/2022.
2. Small amount of free fluid in the cul-de-sac.

## 2022-03-30 NOTE — Progress Notes (Signed)
TC to patient to discuss results.  Awaiting provider to review results to send Rx.

## 2022-03-30 NOTE — Telephone Encounter (Signed)
TC to patient. Informed that she needs to provider another urine sample. Transferred to front desk to schedule apt.

## 2022-04-03 ENCOUNTER — Ambulatory Visit (INDEPENDENT_AMBULATORY_CARE_PROVIDER_SITE_OTHER): Payer: Medicaid Other | Admitting: Student

## 2022-04-03 VITALS — BP 128/71 | HR 87 | Wt 191.8 lb

## 2022-04-03 DIAGNOSIS — Z3483 Encounter for supervision of other normal pregnancy, third trimester: Secondary | ICD-10-CM

## 2022-04-03 DIAGNOSIS — Z3480 Encounter for supervision of other normal pregnancy, unspecified trimester: Secondary | ICD-10-CM

## 2022-04-03 DIAGNOSIS — Z3A37 37 weeks gestation of pregnancy: Secondary | ICD-10-CM

## 2022-04-03 NOTE — Progress Notes (Signed)
Pt presents for ROB. No concerns at this time. 

## 2022-04-03 NOTE — Progress Notes (Signed)
   PRENATAL VISIT NOTE  Subjective:  Denise Tanner is a 21 y.o. G3P0020 at [redacted]w[redacted]d being seen today for ongoing prenatal care.  She is currently monitored for the following issues for this low-risk pregnancy and has Adjustment disorder with mixed disturbance of emotions and conduct; Encounter for supervision of normal pregnancy, antepartum; Gonorrhea affecting pregnancy in first trimester; and Accessory breast tissue of axilla on their problem list.  Patient reports occasional contractions.  Contractions: Irregular. Vag. Bleeding: None.  Movement: Present. Denies leaking of fluid.   The following portions of the patient's history were reviewed and updated as appropriate: allergies, current medications, past family history, past medical history, past social history, past surgical history and problem list.   Objective:   Vitals:   04/03/22 1033  BP: 128/71  Pulse: 87  Weight: 191 lb 12.8 oz (87 kg)    Fetal Status: Fetal Heart Rate (bpm): 146   Movement: Present  Presentation: Vertex  General:  Alert, oriented and cooperative. Patient is in no acute distress.  Skin: Skin is warm and dry. No rash noted.   Cardiovascular: Normal heart rate noted  Respiratory: Normal respiratory effort, no problems with respiration noted  Abdomen: Soft, gravid, appropriate for gestational age.  Pain/Pressure: Absent     Pelvic: Cervical exam deferred        Extremities: Normal range of motion.  Edema: Trace  Mental Status: Normal mood and affect. Normal behavior. Normal judgment and thought content.   Assessment and Plan:  Pregnancy: [id number] at [redacted]w[redacted]d 1. Supervision of other normal pregnancy, antepartum - Feeling well, frequent and vigorous fetal movement - Culture, OB Urine- due to inconclusive specimen with multiple species present collected last week   2. [redacted] weeks gestation of pregnancy - Culture, OB Urine  Term labor symptoms and general obstetric precautions including but not limited to vaginal  bleeding, contractions, leaking of fluid and fetal movement were reviewed in detail with the patient. Please refer to After Visit Summary for other counseling recommendations.   No follow-ups on file.  Future Appointments  Date Time Provider [place]  04/10/2022  9:55 AM Johnston Ebbs, NP [facility] None  04/17/2022 10:35 AM Griffin Basil, MD [facility] None  04/24/2022 10:35 AM Constant, Vickii Chafe, MD [place] None    Johnston Ebbs, NP

## 2022-04-05 ENCOUNTER — Other Ambulatory Visit: Payer: Self-pay

## 2022-04-05 ENCOUNTER — Inpatient Hospital Stay (HOSPITAL_COMMUNITY)
Admission: AD | Admit: 2022-04-05 | Discharge: 2022-04-05 | Disposition: A | Discharge status: Home / Self Care | Payer: Medicaid Other | Attending: Obstetrics and Gynecology | Admitting: Obstetrics and Gynecology

## 2022-04-05 ENCOUNTER — Encounter (HOSPITAL_COMMUNITY): Payer: Self-pay | Admitting: Obstetrics and Gynecology

## 2022-04-05 DIAGNOSIS — O471 False labor at or after 37 completed weeks of gestation: Secondary | ICD-10-CM | POA: Insufficient documentation

## 2022-04-05 DIAGNOSIS — O479 False labor, unspecified: Secondary | ICD-10-CM

## 2022-04-05 DIAGNOSIS — Z3A38 38 weeks gestation of pregnancy: Secondary | ICD-10-CM | POA: Insufficient documentation

## 2022-04-05 LAB — URINE CULTURE, OB REFLEX

## 2022-04-05 LAB — CULTURE, OB URINE

## 2022-04-05 NOTE — MAU Note (Signed)
.  Denise Tanner is a 21 y.o. at [redacted]w[redacted]d here in MAU reporting: ctx q3-5 min since 1100.  Pt denies lof or vag bleeding. +FM  Onset of complaint: 1100 Pain score: 6 Vitals:   04/05/22 1610  BP: 114/68  Pulse: 96  Resp: 17  Temp: 98.1 F (36.7 C)  SpO2: 99%     FHT:145 Lab orders placed from triage:

## 2022-04-06 ENCOUNTER — Telehealth: Payer: Self-pay | Admitting: *Deleted

## 2022-04-06 NOTE — Telephone Encounter (Signed)
Returned TC to pt concerned with urine culture results. Explanation of "mixed urogenital flora" given. Pt verbalized understanding. Reassurance given.

## 2022-04-07 ENCOUNTER — Encounter: Payer: Medicaid Other | Admitting: Family Medicine

## 2022-04-10 ENCOUNTER — Ambulatory Visit (INDEPENDENT_AMBULATORY_CARE_PROVIDER_SITE_OTHER): Payer: Medicaid Other | Admitting: Student

## 2022-04-10 ENCOUNTER — Encounter: Payer: Self-pay | Admitting: Student

## 2022-04-10 VITALS — BP 111/76 | HR 103 | Wt 188.4 lb

## 2022-04-10 DIAGNOSIS — Z3A38 38 weeks gestation of pregnancy: Secondary | ICD-10-CM

## 2022-04-10 DIAGNOSIS — Z3483 Encounter for supervision of other normal pregnancy, third trimester: Secondary | ICD-10-CM

## 2022-04-10 DIAGNOSIS — Z3480 Encounter for supervision of other normal pregnancy, unspecified trimester: Secondary | ICD-10-CM

## 2022-04-10 NOTE — Progress Notes (Signed)
   PRENATAL VISIT NOTE  Subjective:  Denise Tanner is a 21 y.o. G3P0020 at [redacted]w[redacted]d being seen today for ongoing prenatal care.  She is currently monitored for the following issues for this low-risk pregnancy and has Adjustment disorder with mixed disturbance of emotions and conduct; Encounter for supervision of normal pregnancy, antepartum; Gonorrhea affecting pregnancy in first trimester; and Accessory breast tissue of axilla on their problem list.  Patient reports no complaints. Patient has questions about when to schedule an IOL. Contractions: Irregular. Vag. Bleeding: None.  Movement: Present. Denies leaking of fluid.   The following portions of the patient's history were reviewed and updated as appropriate: allergies, current medications, past family history, past medical history, past social history, past surgical history and problem list.   Objective:   Vitals:   04/10/22 1011  BP: 111/76  Pulse: (!) 103  Weight: 188 lb 6.4 oz (85.5 kg)    Fetal Status: Fetal Heart Rate (bpm): 141   Movement: Present     General:  Alert, oriented and cooperative. Patient is in no acute distress.  Skin: Skin is warm and dry. No rash noted.   Cardiovascular: Normal heart rate noted  Respiratory: Normal respiratory effort, no problems with respiration noted  Abdomen: Soft, gravid, appropriate for gestational age.  Pain/Pressure: Absent     Pelvic: Cervical exam deferred        Extremities: Normal range of motion.  Edema: Trace  Mental Status: Normal mood and affect. Normal behavior. Normal judgment and thought content.   Assessment and Plan:  Pregnancy: [id number] at [redacted]w[redacted]d 1. Supervision of other normal pregnancy, antepartum - Doing well, frequent and vigorous fetal movement - US Fetal BPP W/O Non Stress; Future  2. [redacted] weeks gestation of pregnancy - Discussed ways to induce labor such as walking little every day, exercise ball, Red Raspberry Leaf capsules or tea , Evening Primrose Oil capsules,  and/or sexual intercourse. Discussed that there is no medical indication for delivery until 41 weeks. Ordered antenatal testing after completion of 40 weeks.   Term labor symptoms and general obstetric precautions including but not limited to vaginal bleeding, contractions, leaking of fluid and fetal movement were reviewed in detail with the patient. Please refer to After Visit Summary for other counseling recommendations.   No follow-ups on file.  Future Appointments  Date Time Provider [place]  04/17/2022 10:35 AM Griffin Basil, MD [facility] None  04/24/2022 10:35 AM Constant, Vickii Chafe, MD [place] None    Johnston Ebbs, NP

## 2022-04-10 NOTE — Progress Notes (Signed)
Pt presents for ROB visit. No concerns at this time.  

## 2022-04-12 ENCOUNTER — Inpatient Hospital Stay (HOSPITAL_COMMUNITY)
Admission: AD | Admit: 2022-04-12 | Discharge: 2022-04-12 | Disposition: A | Discharge status: Home / Self Care | Payer: Medicaid Other | Attending: Obstetrics and Gynecology | Admitting: Obstetrics and Gynecology

## 2022-04-12 ENCOUNTER — Encounter (HOSPITAL_COMMUNITY): Payer: Self-pay | Admitting: Obstetrics and Gynecology

## 2022-04-12 ENCOUNTER — Other Ambulatory Visit: Payer: Self-pay

## 2022-04-12 DIAGNOSIS — O471 False labor at or after 37 completed weeks of gestation: Secondary | ICD-10-CM | POA: Insufficient documentation

## 2022-04-12 DIAGNOSIS — O479 False labor, unspecified: Secondary | ICD-10-CM | POA: Diagnosis not present

## 2022-04-12 DIAGNOSIS — Z3A39 39 weeks gestation of pregnancy: Secondary | ICD-10-CM

## 2022-04-12 DIAGNOSIS — B379 Candidiasis, unspecified: Secondary | ICD-10-CM | POA: Diagnosis not present

## 2022-04-12 NOTE — Progress Notes (Signed)
S: [name] is a 21 y.o. G3P0020 at [redacted]w[redacted]d  who presents to MAU today for a labor eval. See nursing note for further HPI.  O: BP 123/74   Pulse 97   Temp 98.4 F (36.9 C) (Oral)   Resp 16   LMP 06/19/2021 (Exact Date) Comment: neg test at clinic 2/6 ?test  SpO2 99%   Cervical exam:  Dilation: Closed Effacement (%): Thick Cervical Position: Posterior Presentation: Undeterminable Exam by:: jessica hashem rn  Fetal Monitoring: Baseline: 130bpm Variability: Moderate Accelerations: Present Decelerations: Absent Contractions: Occasional w/ irritability  A: SIUP at [redacted]w[redacted]d  False labor  P: -F/u w/ OB -Return precautions  Autry-Lott, Tremaine Fuhriman, DO 04/12/2022 5:33 PM

## 2022-04-12 NOTE — MAU Note (Signed)
Pt reports to mau with c/o ctx, ongoing since last night, pt unable to tell how far apart ctx are, states "they are close".  Denies LOF.

## 2022-04-13 ENCOUNTER — Other Ambulatory Visit: Payer: Self-pay

## 2022-04-13 ENCOUNTER — Telehealth: Payer: Self-pay | Admitting: Emergency Medicine

## 2022-04-13 ENCOUNTER — Inpatient Hospital Stay (HOSPITAL_COMMUNITY)
Admission: AD | Admit: 2022-04-13 | Discharge: 2022-04-13 | Disposition: A | Discharge status: Home / Self Care | Payer: Medicaid Other | Attending: Family Medicine | Admitting: Family Medicine

## 2022-04-13 ENCOUNTER — Encounter (HOSPITAL_COMMUNITY): Payer: Self-pay | Admitting: Family Medicine

## 2022-04-13 DIAGNOSIS — O98813 Other maternal infectious and parasitic diseases complicating pregnancy, third trimester: Secondary | ICD-10-CM | POA: Diagnosis not present

## 2022-04-13 DIAGNOSIS — O471 False labor at or after 37 completed weeks of gestation: Secondary | ICD-10-CM | POA: Insufficient documentation

## 2022-04-13 DIAGNOSIS — Z3A39 39 weeks gestation of pregnancy: Secondary | ICD-10-CM | POA: Insufficient documentation

## 2022-04-13 DIAGNOSIS — O479 False labor, unspecified: Secondary | ICD-10-CM

## 2022-04-13 DIAGNOSIS — B379 Candidiasis, unspecified: Secondary | ICD-10-CM | POA: Insufficient documentation

## 2022-04-13 LAB — WET PREP, GENITAL
Clue Cells Wet Prep HPF POC: NONE SEEN
Sperm: NONE SEEN
Trich, Wet Prep: NONE SEEN
WBC, Wet Prep HPF POC: >=10 — AB (ref <10)
Yeast Wet Prep HPF POC: NONE SEEN

## 2022-04-13 LAB — POCT FERN TEST: POCT Fern Test: NEGATIVE

## 2022-04-13 MED ORDER — TERCONAZOLE 0.4 % VA CREA: 1.0000 | TOPICAL_CREAM | Freq: Every day | VAGINAL | 0 refills | Status: DC

## 2022-04-13 NOTE — MAU Note (Signed)
Denise Tanner is a 21 y.o. at [redacted]w[redacted]d here in MAU reporting: she noticed a wet spot on her bed when she woke up @ 0900 this morning.  States went to the restroom, wiped, and fluid was clear.  Denies VB.  Endorses +FM LMP: N/A Onset of complaint: today Pain score: 8 Vitals:   04/13/22 1103  BP: 124/73  Pulse: (!) 109  Resp: 18  Temp: 98.6 F (37 C)  SpO2: 97%     FHT:145 bpm Lab orders placed from triage:   None

## 2022-04-13 NOTE — Telephone Encounter (Signed)
TC from patient's mother to nurse line. States patient woke up this morning, has lost mucus plug and has fluid in the bed. Mother reports patient is in pain. This RN is unable to assess patient directly, as patient was on separate phone call with FOB. Pt mother states that EMS arrived, but did not take patient to hospital because they stated she was not in labor. This RN recommended pt to present to MAU to be evaluated ROM. Pt's mother agreed and states she will take pt to be evaluated.

## 2022-04-13 NOTE — MAU Provider Note (Signed)
Event Date/Time   First Provider Initiated Contact with Patient 04/13/22 1131       S: [name] is a 21 y.o. G3P0020 at [redacted]w[redacted]d  who presents to MAU today complaining of leaking of fluid since 0900. She denies vaginal bleeding. She endorses contractions. Currently rating pain 8/10 . She reports normal fetal movement.    O: BP 117/81 (BP Location: Right Arm)   Pulse (!) 110   Temp 98.6 F (37 C) (Oral)   Resp 18   LMP 06/19/2021 (Exact Date) Comment: neg test at clinic 2/6 ?test  SpO2 100%  GENERAL: Well-developed, well-nourished female in no acute distress.  HEAD: Normocephalic, atraumatic.  CHEST: Normal effort of breathing, regular heart rate ABDOMEN: Soft, nontender, gravid PELVIC: Normal external female genitalia. Vagina is pink and rugated. Cervix with normal contour, no lesions. Copious amounts of white clumpy discharge observed. Patient noted moderate tenderness on exam. Negative for pooling.   Cervical exam:  Dilation: 1 Effacement (%): 70 Station: -3 Presentation: Undeterminable Exam by:: Huntley Dec, CNMDilation: 1 Effacement (%): 46 Station: -3 Presentation: Undeterminable Exam by:: Huntley Dec, CNM    Fetal Monitoring: Baseline: 150bpm Variability: moderate Accelerations: present  Decelerations: absent  Contractions: irregular   Results for orders placed or performed during the hospital encounter of 04/13/22 (from the past 24 hour(s))  POCT fern test     Status: None   Collection Time: 04/13/22 12:28 PM  Result Value Ref Range   POCT Fern Test Negative = intact amniotic membranes    Orders Placed This Encounter  Procedures   Wet prep, genital   Contraction - monitoring   External fetal heart monitoring   Vaginal exam   POCT fern test   Discharge patient      A: SIUP at [redacted]w[redacted]d  Membranes intact Vaginal tenderness and clumpy white discharge consistent with yeast observed.  Wet prep noted for WBCs otherwise normal GC pending upon discharge.  Will notify patient of concerning results.  Plan for discharge   P: 1. Yeast infection   2. False labor   3. [redacted] weeks gestation of pregnancy    - Discussed yeast in pregnancy.  - Terazol 7 sent to outpatient pharmacy - worsening signs and return symptoms reviewed.  - Term labor precautions reviewed - FHT Cat I upon discharge - Patient discharged home in stable condition and may return to MAU as needed.   Deloris Ping, CNM 04/13/2022 12:29 PM

## 2022-04-14 LAB — GC/CHLAMYDIA PROBE AMP (~~LOC~~) NOT AT ARMC
Chlamydia: NEGATIVE
Comment: NEGATIVE
Comment: NORMAL
Neisseria Gonorrhea: NEGATIVE

## 2022-04-14 NOTE — Telephone Encounter (Signed)
INFORMED PT OF MEDS BEING SENT YESTERDAY FOR MEDICAL ISSUE

## 2022-04-15 ENCOUNTER — Inpatient Hospital Stay (HOSPITAL_COMMUNITY): Payer: Medicaid Other | Admitting: Anesthesiology

## 2022-04-15 ENCOUNTER — Other Ambulatory Visit: Payer: Self-pay

## 2022-04-15 ENCOUNTER — Inpatient Hospital Stay (HOSPITAL_COMMUNITY)
Admission: AD | Admit: 2022-04-15 | Discharge: 2022-04-18 | DRG: 807 | Disposition: A | Discharge status: Home / Self Care | Payer: Medicaid Other | Attending: Obstetrics and Gynecology | Admitting: Obstetrics and Gynecology

## 2022-04-15 ENCOUNTER — Encounter (HOSPITAL_COMMUNITY): Payer: Self-pay | Admitting: Obstetrics and Gynecology

## 2022-04-15 DIAGNOSIS — O99214 Obesity complicating childbirth: Secondary | ICD-10-CM | POA: Diagnosis present

## 2022-04-15 DIAGNOSIS — O4290 Premature rupture of membranes, unspecified as to length of time between rupture and onset of labor, unspecified weeks of gestation: Secondary | ICD-10-CM | POA: Diagnosis present

## 2022-04-15 DIAGNOSIS — O4202 Full-term premature rupture of membranes, onset of labor within 24 hours of rupture: Secondary | ICD-10-CM | POA: Diagnosis not present

## 2022-04-15 DIAGNOSIS — O4292 Full-term premature rupture of membranes, unspecified as to length of time between rupture and onset of labor: Secondary | ICD-10-CM | POA: Diagnosis present

## 2022-04-15 DIAGNOSIS — Z7982 Long term (current) use of aspirin: Secondary | ICD-10-CM

## 2022-04-15 DIAGNOSIS — Z3A39 39 weeks gestation of pregnancy: Secondary | ICD-10-CM | POA: Diagnosis not present

## 2022-04-15 DIAGNOSIS — O26893 Other specified pregnancy related conditions, third trimester: Secondary | ICD-10-CM | POA: Diagnosis present

## 2022-04-15 LAB — POCT FERN TEST: POCT Fern Test: POSITIVE

## 2022-04-15 LAB — CBC
HCT: 36.6 % (ref 36.0–46.0)
Hemoglobin: 12.2 g/dL (ref 12.0–15.0)
MCH: 25.8 pg — ABNORMAL LOW (ref 26.0–34.0)
MCHC: 33.3 g/dL (ref 30.0–36.0)
MCV: 77.5 fL — ABNORMAL LOW (ref 80.0–100.0)
Platelets: 333 10*3/uL (ref 150–400)
RBC: 4.72 MIL/uL (ref 3.87–5.11)
RDW: 15.1 % (ref 11.5–15.5)
WBC: 12.4 10*3/uL — ABNORMAL HIGH (ref 4.0–10.5)
nRBC: 0 % (ref 0.0–0.2)

## 2022-04-15 LAB — RPR: RPR Ser Ql: NONREACTIVE

## 2022-04-15 MED ORDER — OXYTOCIN BOLUS FROM INFUSION
333.0000 mL | Freq: Once | INTRAVENOUS | Status: AC
  Administered 2022-04-16: 333 mL via INTRAVENOUS

## 2022-04-15 MED ORDER — PHENYLEPHRINE 80 MCG/ML (10ML) SYRINGE FOR IV PUSH (FOR BLOOD PRESSURE SUPPORT): 80.0000 ug | PREFILLED_SYRINGE | INTRAVENOUS | Status: DC | PRN

## 2022-04-15 MED ORDER — ACETAMINOPHEN 325 MG PO TABS: 650.0000 mg | ORAL_TABLET | ORAL | Status: DC | PRN

## 2022-04-15 MED ORDER — HYDROXYZINE HCL 50 MG PO TABS: 50.0000 mg | ORAL_TABLET | Freq: Four times a day (QID) | ORAL | Status: DC | PRN

## 2022-04-15 MED ORDER — LIDOCAINE HCL (PF) 1 % IJ SOLN: 30.0000 mL | INTRAMUSCULAR | Status: DC | PRN

## 2022-04-15 MED ORDER — EPHEDRINE 5 MG/ML INJ: 10.0000 mg | INTRAVENOUS | Status: DC | PRN

## 2022-04-15 MED ORDER — FENTANYL 2.5 MCG/ML BUPIVACAINE 1/10 % EPIDURAL INFUSION (WH - ANES)
INTRAMUSCULAR | Status: DC | PRN
  Administered 2022-04-15: 12 mL/h via EPIDURAL

## 2022-04-15 MED ORDER — DIPHENHYDRAMINE HCL 50 MG/ML IJ SOLN: 12.5000 mg | INTRAMUSCULAR | Status: DC | PRN

## 2022-04-15 MED ORDER — FENTANYL-BUPIVACAINE-NACL 0.5-0.125-0.9 MG/250ML-% EP SOLN
12.0000 mL/h | EPIDURAL | Status: DC | PRN
  Filled 2022-04-15: qty 250

## 2022-04-15 MED ORDER — LACTATED RINGERS IV SOLN
500.0000 mL | INTRAVENOUS | Status: DC | PRN
  Administered 2022-04-15: 500 mL via INTRAVENOUS

## 2022-04-15 MED ORDER — ONDANSETRON HCL 4 MG/2ML IJ SOLN
4.0000 mg | Freq: Four times a day (QID) | INTRAMUSCULAR | Status: DC | PRN
  Administered 2022-04-15: 4 mg via INTRAVENOUS
  Filled 2022-04-15: qty 2

## 2022-04-15 MED ORDER — OXYCODONE-ACETAMINOPHEN 5-325 MG PO TABS: 2.0000 | ORAL_TABLET | ORAL | Status: DC | PRN

## 2022-04-15 MED ORDER — OXYCODONE-ACETAMINOPHEN 5-325 MG PO TABS: 1.0000 | ORAL_TABLET | ORAL | Status: DC | PRN

## 2022-04-15 MED ORDER — SOD CITRATE-CITRIC ACID 500-334 MG/5ML PO SOLN
30.0000 mL | ORAL | Status: DC | PRN
  Administered 2022-04-15: 30 mL via ORAL
  Filled 2022-04-15: qty 30

## 2022-04-15 MED ORDER — TERBUTALINE SULFATE 1 MG/ML IJ SOLN
0.2500 mg | Freq: Once | INTRAMUSCULAR | Status: AC | PRN
  Administered 2022-04-15: 0.25 mg via SUBCUTANEOUS
  Filled 2022-04-15: qty 1

## 2022-04-15 MED ORDER — LIDOCAINE HCL (PF) 1 % IJ SOLN
INTRAMUSCULAR | Status: DC | PRN
  Administered 2022-04-15 (×2): 5 mL

## 2022-04-15 MED ORDER — OXYTOCIN-SODIUM CHLORIDE 30-0.9 UT/500ML-% IV SOLN
1.0000 m[IU]/min | INTRAVENOUS | Status: DC
  Administered 2022-04-15 – 2022-04-16 (×2): 2 m[IU]/min via INTRAVENOUS
  Filled 2022-04-15: qty 500

## 2022-04-15 MED ORDER — FLEET ENEMA 7-19 GM/118ML RE ENEM: 1.0000 | ENEMA | RECTAL | Status: DC | PRN

## 2022-04-15 MED ORDER — LACTATED RINGERS IV SOLN: INTRAVENOUS | Status: DC

## 2022-04-15 MED ORDER — OXYTOCIN-SODIUM CHLORIDE 30-0.9 UT/500ML-% IV SOLN: 2.5000 [IU]/h | INTRAVENOUS | Status: DC

## 2022-04-15 MED ORDER — PHENYLEPHRINE 80 MCG/ML (10ML) SYRINGE FOR IV PUSH (FOR BLOOD PRESSURE SUPPORT)
80.0000 ug | PREFILLED_SYRINGE | INTRAVENOUS | Status: DC | PRN
  Filled 2022-04-15: qty 10

## 2022-04-15 MED ORDER — LACTATED RINGERS IV SOLN
500.0000 mL | Freq: Once | INTRAVENOUS | Status: AC
  Administered 2022-04-16: 500 mL via INTRAVENOUS

## 2022-04-15 MED ORDER — FENTANYL CITRATE (PF) 100 MCG/2ML IJ SOLN: 100.0000 ug | INTRAMUSCULAR | Status: DC | PRN

## 2022-04-15 NOTE — Anesthesia Procedure Notes (Signed)
Epidural Patient location during procedure: OB Start time: 04/15/2022 11:30 AM End time: 04/15/2022 11:41 AM  Staffing Anesthesiologist: Mal Amabile, MD Performed: anesthesiologist   Preanesthetic Checklist Completed: patient identified, IV checked, site marked, risks and benefits discussed, surgical consent, monitors and equipment checked, pre-op evaluation and timeout performed  Epidural Patient position: sitting Prep: DuraPrep and site prepped and draped Patient monitoring: continuous pulse ox and blood pressure Approach: midline Location: L3-L4 Injection technique: LOR saline  Needle:  Needle type: Tuohy  Needle gauge: 17 G Needle length: 9 cm and 9 Needle insertion depth: 4 cm Catheter type: closed end flexible Catheter size: 19 Gauge Catheter at skin depth: 9 cm Test dose: negative and Other  Assessment Events: blood not aspirated, injection not painful, no injection resistance, no paresthesia and negative IV test  Additional Notes Patient identified. Risks and benefits discussed including failed block, incomplete  Pain control, post dural puncture headache, nerve damage, paralysis, blood pressure Changes, nausea, vomiting, reactions to medications-both toxic and allergic and post Partum back pain. All questions were answered. Patient expressed understanding and wished to proceed. Sterile technique was used throughout procedure. Epidural site was Dressed with sterile barrier dressing. No paresthesias, signs of intravascular injection Or signs of intrathecal spread were encountered.  Patient was more comfortable after the epidural was dosed. Please see RN's note for documentation of vital signs and FHR which are stable. Reason for block:procedure for pain

## 2022-04-15 NOTE — Progress Notes (Signed)
Patient ID: Denise Tanner, female   DOB: [birth year], 21 y.o.   MRN: [id number] Denise Tanner is a 21 y.o. G3P0020 at [redacted]w[redacted]d admitted for early labor and SROM  Subjective: Some contractions a little uncomfortable, no complaints  Objective: BP 118/67 (BP Location: Left Arm)   Pulse 96   Temp 97.8 F (36.6 C) (Oral)   Resp 18   Ht 5\' 5"  (1.651 m)   Wt 90.3 kg   LMP 06/19/2021 (Exact Date) Comment: neg test at clinic 2/6 ?test  BMI 33.12 kg/m  No intake/output data recorded.  FHR baseline 120 bpm, Variability: moderate, Accelerations:present, Decelerations:  Absent Toco: q 2-5 mins   SVE:   Dilation: 2 Effacement (%): 60 Station: -3 Exam by:: [email or web address] CNM Forebag felt, tried AROM but super high and pt unable to tolerate  Labs: Lab Results  Component Value Date   WBC 12.4 (H) 04/15/2022   HGB 12.2 04/15/2022   HCT 36.6 04/15/2022   MCV 77.5 (L) 04/15/2022   PLT 333 04/15/2022    Assessment / Plan: SROM/early labor, contractions have spaced some, cx essentially unchanged. Forebag, unable to AROM d/t high station/pt uncomfortable. Will start pit 2x2  Labor: early Fetal Wellbeing:  Category I Pain Control:  labor support without medications Pre-eclampsia: N/A I/D:  GBS neg Anticipated MOD: NSVB  13/01/2022 CNM, WHNP-BC 04/15/2022, 7:42 AM

## 2022-04-15 NOTE — H&P (Signed)
Denise Tanner is a 21 y.o. G81P0020 female at 61w3dby 5wk u/s, presenting w/ SROM clear fluid 0215 & early labor.   Reports active fetal movement, contractions: regular, every 1-3 minutes, vaginal bleeding: none, membranes: ruptured, clear fluid.  [facility]    This pregnancy complicated by: [place] in [place], [place], [place] ? HSV outbreak 7/1 at 20.6wks, (no culture sent), pt denied dx  Prenatal History/Complications:  SAB x 2  Past Medical History: Past Medical History:  Diagnosis Date   BV (bacterial vaginosis)    Gonorrhea    Medical history non-contributory    UTI (urinary tract infection)     Past Surgical History: Past Surgical History:  Procedure Laterality Date   NO PAST SURGERIES      Obstetrical History: OB History     Gravida  3   Para  0   Term  0   Preterm  0   AB  2   Living  0      SAB  2   IAB  0   Ectopic  0   Multiple  0   Live Births              Social History: Social History   Socioeconomic History   Marital status: Single    Spouse name: Not on file   Number of children: Not on file   Years of education: Not on file   Highest education level: Not on file  Occupational History   Not on file  Tobacco Use   Smoking status: Never    Passive exposure: Never   Smokeless tobacco: Never  Vaping Use   Vaping Use: Never used  Substance and Sexual Activity   Alcohol use: No   Drug use: No   Sexual activity: Not Currently    Partners: Male    Birth control/protection: None  Other Topics Concern   Not on file  Social History Narrative   Lives with mom and brother.    Social Determinants of Health   Financial Resource Strain: Not on file  Food Insecurity: Not on file  Transportation Needs: Not on file  Physical Activity: Not on file  Stress: Not on file  Social Connections: Not on file    Family History: Family History  Problem Relation Age of Onset    Sickle cell anemia Mother    Hypothyroidism Mother    Healthy Father        was killed    Allergies: No Known Allergies  Medications Prior to Admission  Medication Sig Dispense Refill Last Dose   aspirin EC 81 MG tablet Take 1 tablet (81 mg total) by mouth daily. Swallow whole. 30 tablet 11 Past Week   Prenatal Vit-Fe Phos-FA-Omega (VITAFOL GUMMIES) 3.33-0.333-34.8 MG CHEW Chew 3 tablets by mouth daily. 90 tablet 11 04/14/2022   Blood Pressure Monitoring (BLOOD PRESSURE KIT) DEVI 1 Device by Does not apply route as needed. 1 each 0    terconazole (TERAZOL 7) 0.4 % vaginal cream Place 1 applicator vaginally at bedtime. Use for seven days 45 g 0     Review of Systems  Pertinent pos/neg as indicated in HPI  Blood pressure 112/74, pulse (!) 105, temperature 98.4 F (36.9 C), temperature source Oral, resp. rate 18, height _0  (1.651 m), weight 90.3 kg, last menstrual period 06/19/2021. General appearance: alert, cooperative, and no distress Lungs: clear to auscultation bilaterally Heart: regular rate and  rhythm Abdomen: gravid, soft, non-tender Extremities: tr edema  Fetal monitoring: FHR: 125 bpm, variability: moderate,  Accelerations: Present,  decelerations:  Absent Uterine activity: q 1-3 mins Dilation: 1 Effacement (%): 60 Station: -3 Exam by:: Caryl Pina, RN Presentation: cephalic   Prenatal labs: ABO, Rh: O/Positive/-- (04/25 6440) Antibody: Negative (04/25 0928) Rubella: 3.41 (04/25 0928) RPR: Non Reactive (08/16 1032)  HBsAg: Negative (04/25 0928)  HIV: Non Reactive (08/16 1032)  GBS: Negative/-- (10/20 1134)  2hr GTT: normal  Results for orders placed or performed during the hospital encounter of 04/15/22 (from the past 24 hour(s))  Fern Test   Collection Time: 04/15/22  2:30 AM  Result Value Ref Range   POCT Fern Test Positive = ruptured amniotic membanes      Assessment:  60w3dSIUP  G3P0020  SROM/early labor  Cat 1 FHR  ?HSV 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

## 2022-04-15 NOTE — MAU Note (Signed)
Pt has arrived via EMS - saying SROM- 0215- while pt was in a store. [facility] [employer]  Denies HSV GBS- neg

## 2022-04-15 NOTE — Progress Notes (Signed)
Denise Tanner is a 21 y.o. G3P0020 at [redacted]w[redacted]d by ultrasound admitted for rupture of membranes  Subjective: Pt comfortable with epidural. No complaints.  Objective: BP 120/62   Pulse 85   Temp 97.7 F (36.5 C) (Oral)   Resp 16   Ht 5\' 5"  (1.651 m)   Wt 90.3 kg   LMP 06/19/2021 (Exact Date) Comment: neg test at clinic 2/6 ?test  SpO2 100%   BMI 33.12 kg/m  No intake/output data recorded. Total I/O In: -  Out: 750 [Urine:750]  FHT:  FHR: 135 bpm, variability: moderate,  accelerations:  Abscent,  decelerations:  Absent UC:   regular, every 2-3 minutes SVE:   Dilation: 10 Effacement (%): 90 Station: 0 Exam by:: [email or web address], RN  Labs: Lab Results  Component Value Date   WBC 12.4 (H) 04/15/2022   HGB 12.2 04/15/2022   HCT 36.6 04/15/2022   MCV 77.5 (L) 04/15/2022   PLT 333 04/15/2022    Assessment / Plan: Augmentation of labor, progressing well  Labor: Progressing normally, not currently on pitocin, pt contracting well on her own. Fetal Wellbeing:  Category II continue to monitor Pain Control:  Epidural I/D:   GBS negative Anticipated MOD:  NSVD  Denise Valent Autry-Lott, DO 04/15/2022, 11:34 PM

## 2022-04-15 NOTE — Progress Notes (Signed)
Denise Tanner is a 21 y.o. G3P0020 at [redacted]w[redacted]d by ultrasound admitted for rupture of membranes  Subjective: Pt comfortable with epidural. No complaints.  Objective: BP 122/79   Pulse (!) 102   Temp 98 F (36.7 C) (Oral)   Resp 16   Ht 5\' 5"  (1.651 m)   Wt 90.3 kg   LMP 06/19/2021 (Exact Date) Comment: neg test at clinic 2/6 ?test  SpO2 100%   BMI 33.12 kg/m  No intake/output data recorded. No intake/output data recorded.  FHT:  FHR: 135 bpm, variability: moderate,  accelerations:  Abscent,  decelerations:  Absent UC:   regular, every 2-3 minutes SVE:   Dilation: 6.5 Effacement (%): 100 Station: 0 Exam by:: [email or web address] RN  Labs: Lab Results  Component Value Date   WBC 12.4 (H) 04/15/2022   HGB 12.2 04/15/2022   HCT 36.6 04/15/2022   MCV 77.5 (L) 04/15/2022   PLT 333 04/15/2022    Assessment / Plan: Augmentation of labor, progressing well  Labor: Progressing normally, not currently on pitocin, pt contracting well on her own. Fetal Wellbeing:  Category II continue to monitor Pain Control:  Epidural I/D:   GBS negative Anticipated MOD:  NSVD  13/01/2022, Medical Student 04/15/2022, 7:51 PM

## 2022-04-15 NOTE — Progress Notes (Signed)
Denise Tanner is a 21 y.o. G3P0020 at [redacted]w[redacted]d by ultrasound admitted for rupture of membranes  Subjective: Patient doing well. Bouncing on ball and up walking around the room. Introductions exchanged.   Objective: BP (!) 127/108   Pulse 80   Temp 98.4 F (36.9 C) (Oral)   Resp 16   Ht 5\' 5"  (1.651 m)   Wt 90.3 kg   LMP 06/19/2021 (Exact Date) Comment: neg test at clinic 2/6 ?test  BMI 33.12 kg/m  No intake/output data recorded. No intake/output data recorded.  FHT:  FHR: 135 bpm, variability: moderate,  accelerations:  Present,  decelerations:  Present occasional earlies with contractions.  UC:   regular, every 3-5  minutes SVE:   Dilation: 2 Effacement (%): 60 Station: -3 Exam by:: [email or web address] CNM  Labs: Lab Results  Component Value Date   WBC 12.4 (H) 04/15/2022   HGB 12.2 04/15/2022   HCT 36.6 04/15/2022   MCV 77.5 (L) 04/15/2022   PLT 333 04/15/2022    Assessment / Plan: Augmentation of labor s/p SROM, making minimal change. Pit 2x2 initiated @ 0800   Labor: Progressing on Pitocin, will continue to increase then AROM.  Fetal Wellbeing:  Category II- continuous monitoring for signs of fetal distress.  Pain Control:  Epidural- patient may have an epidural upon request  I/D:   GBS Negative  Anticipated MOD:  NSVD  13/01/2022, CNM 04/15/2022, 9:42 AM

## 2022-04-15 NOTE — Progress Notes (Signed)
Denise Tanner is a 21 y.o. G3P0020 at [redacted]w[redacted]d by ultrasound admitted for rupture of membranes  Subjective: Patient comfortable with epidural. Felt a gush of fluid and SROM.   Objective: BP 120/76   Pulse 80   Temp 98.6 F (37 C) (Oral)   Resp 18   Ht 5\' 5"  (1.651 m)   Wt 90.3 kg   LMP 06/19/2021 (Exact Date) Comment: neg test at clinic 2/6 ?test  SpO2 100%   BMI 33.12 kg/m  No intake/output data recorded. No intake/output data recorded.  FHT:  FHR: 135 bpm, variability: moderate,  accelerations:  Abscent,  decelerations:  Present Early and variables with slow return to baseline.   UC:   regular, every 2-3 minutes SVE:   Dilation: 5 Effacement (%): 100 Station: 0, -1 Exam by:: [email or web address] RN    Labs: Lab Results  Component Value Date   WBC 12.4 (H) 04/15/2022   HGB 12.2 04/15/2022   HCT 36.6 04/15/2022   MCV 77.5 (L) 04/15/2022   PLT 333 04/15/2022    Assessment / Plan: Augmentation of labor, progressing well. CNM called to patient bedside for recurrent variable decels with contractions s/p SROM (clear fluid) and slow return to baseline. Terb given. FSE placed by 13/01/2022 RN without difficulty.    Labor: Progressing on Pitocin, s/p SROM. Clear fluid. Plan to restart pit if needed.  Fetal Wellbeing:  Category II- Continue to monitor  Pain Control:  Epidural I/D:   GBS Negative  Anticipated MOD:  NSVD  Ferne Coe, CNM 04/15/2022, 2:28 PM

## 2022-04-15 NOTE — Anesthesia Preprocedure Evaluation (Signed)
Anesthesia Evaluation  Patient identified by MRN, date of birth, ID band Patient awake    Reviewed: Allergy & Precautions, Patient's Chart, lab work & pertinent test results  Airway Mallampati: II       Dental no notable dental hx. (+) Teeth Intact   Pulmonary neg pulmonary ROS   Pulmonary exam normal breath sounds clear to auscultation       Cardiovascular negative cardio ROS Normal cardiovascular exam Rhythm:Regular Rate:Normal     Neuro/Psych  PSYCHIATRIC DISORDERS      negative neurological ROS     GI/Hepatic Neg liver ROS,GERD  ,,  Endo/Other  Obesity  Renal/GU negative Renal ROS  negative genitourinary   Musculoskeletal negative musculoskeletal ROS (+)    Abdominal  (+) + obese  Peds  Hematology negative hematology ROS (+)   Anesthesia Other Findings   Reproductive/Obstetrics (+) Pregnancy                              Anesthesia Physical Anesthesia Plan  ASA: 2  Anesthesia Plan: Epidural   Post-op Pain Management:    Induction:   PONV Risk Score and Plan:   Airway Management Planned: Natural Airway  Additional Equipment:   Intra-op Plan:   Post-operative Plan:   Informed Consent: I have reviewed the patients History and Physical, chart, labs and discussed the procedure including the risks, benefits and alternatives for the proposed anesthesia with the patient or authorized representative who has indicated his/her understanding and acceptance.       Plan Discussed with: Anesthesiologist  Anesthesia Plan Comments:          Anesthesia Quick Evaluation

## 2022-04-16 ENCOUNTER — Encounter (HOSPITAL_COMMUNITY): Payer: Self-pay | Admitting: Obstetrics and Gynecology

## 2022-04-16 DIAGNOSIS — Z3A39 39 weeks gestation of pregnancy: Secondary | ICD-10-CM

## 2022-04-16 DIAGNOSIS — O4202 Full-term premature rupture of membranes, onset of labor within 24 hours of rupture: Secondary | ICD-10-CM

## 2022-04-16 LAB — TYPE AND SCREEN
ABO/RH(D): O POS
Antibody Screen: NEGATIVE

## 2022-04-16 MED ORDER — PRENATAL MULTIVITAMIN CH
1.0000 | ORAL_TABLET | Freq: Every day | ORAL | Status: DC
  Administered 2022-04-16 – 2022-04-17 (×2): 1 via ORAL
  Filled 2022-04-16 (×2): qty 1

## 2022-04-16 MED ORDER — ACETAMINOPHEN 325 MG PO TABS: 650.0000 mg | ORAL_TABLET | ORAL | Status: DC | PRN

## 2022-04-16 MED ORDER — DIPHENHYDRAMINE HCL 25 MG PO CAPS: 25.0000 mg | ORAL_CAPSULE | Freq: Four times a day (QID) | ORAL | Status: DC | PRN

## 2022-04-16 MED ORDER — SIMETHICONE 80 MG PO CHEW: 80.0000 mg | CHEWABLE_TABLET | ORAL | Status: DC | PRN

## 2022-04-16 MED ORDER — COCONUT OIL OIL: 1.0000 | TOPICAL_OIL | Status: DC | PRN

## 2022-04-16 MED ORDER — DIBUCAINE (PERIANAL) 1 % EX OINT: 1.0000 | TOPICAL_OINTMENT | CUTANEOUS | Status: DC | PRN

## 2022-04-16 MED ORDER — WITCH HAZEL-GLYCERIN EX PADS: 1.0000 | MEDICATED_PAD | CUTANEOUS | Status: DC | PRN

## 2022-04-16 MED ORDER — SENNOSIDES-DOCUSATE SODIUM 8.6-50 MG PO TABS
2.0000 | ORAL_TABLET | ORAL | Status: DC
  Administered 2022-04-16 – 2022-04-18 (×2): 2 via ORAL
  Filled 2022-04-16 (×2): qty 2

## 2022-04-16 MED ORDER — ONDANSETRON HCL 4 MG/2ML IJ SOLN: 4.0000 mg | INTRAMUSCULAR | Status: DC | PRN

## 2022-04-16 MED ORDER — MEASLES, MUMPS & RUBELLA VAC IJ SOLR: 0.5000 mL | Freq: Once | INTRAMUSCULAR | Status: DC

## 2022-04-16 MED ORDER — ONDANSETRON HCL 4 MG PO TABS: 4.0000 mg | ORAL_TABLET | ORAL | Status: DC | PRN

## 2022-04-16 MED ORDER — TETANUS-DIPHTH-ACELL PERTUSSIS 5-2.5-18.5 LF-MCG/0.5 IM SUSY: 0.5000 mL | PREFILLED_SYRINGE | Freq: Once | INTRAMUSCULAR | Status: DC

## 2022-04-16 MED ORDER — IBUPROFEN 600 MG PO TABS
600.0000 mg | ORAL_TABLET | Freq: Four times a day (QID) | ORAL | Status: DC
  Administered 2022-04-16 – 2022-04-18 (×9): 600 mg via ORAL
  Filled 2022-04-16 (×11): qty 1

## 2022-04-16 MED ORDER — OXYCODONE HCL 5 MG PO TABS: 5.0000 mg | ORAL_TABLET | ORAL | Status: DC | PRN

## 2022-04-16 MED ORDER — BENZOCAINE-MENTHOL 20-0.5 % EX AERO
1.0000 | INHALATION_SPRAY | CUTANEOUS | Status: DC | PRN
  Filled 2022-04-16: qty 56

## 2022-04-16 MED ORDER — ZOLPIDEM TARTRATE 5 MG PO TABS: 5.0000 mg | ORAL_TABLET | Freq: Every evening | ORAL | Status: DC | PRN

## 2022-04-16 NOTE — Discharge Summary (Signed)
   Postpartum Discharge Summary  Date of Service updated    Patient Name: Denise Tanner DOB: 05/18/2001 MRN: [id number]  Date of admission: 04/15/2022 Delivery date:04/16/2022  Delivering provider: SHAW, KIMBERLY D  Date of discharge: 04/18/2022  Admitting diagnosis: Leakage, amniotic fluid [O42.90] Intrauterine pregnancy: [redacted]w[redacted]d     Secondary diagnosis:  Principal Problem:   Leakage, amniotic fluid  Additional problems: none    Discharge diagnosis: Term Pregnancy Delivered                                              Post partum procedures: None Augmentation: Pitocin Complications: None (PROM ~23h)  Hospital course: Induction of Labor With Vaginal Delivery   21 y.o. yo G3P0020 at [redacted]w[redacted]d was admitted to the hospital 04/15/2022 for induction of labor.  Indication for induction: PROM.  Patient had an unremarkable labor course remaining afebrile throughout until right after the baby was born she had a T of 100.7. Membrane Rupture Time/Date: 2:15 AM ,04/15/2022   Delivery Method:Vaginal, Spontaneous  Episiotomy: None  Lacerations:  None  Details of delivery can be found in separate delivery note.  Patient had a postpartum course that was uncomplicated. Patient is discharged home 04/18/22.  Newborn Data: Birth date:04/16/2022  Birth time:12:50 AM  Gender:Female  Living status:Living  Apgars:7 ,8  Weight:3250 g (7lb 2.6oz)  Magnesium Sulfate received: No BMZ received: No Rhophylac:N/A MMR:N/A T-DaP: offered postpartum Flu: No Transfusion:No  Physical exam  Vitals:   04/18/22 0005 04/18/22 0315 04/18/22 0716 04/18/22 0848  BP: 124/83 116/74 109/88 126/78  Pulse: 82 69 90 82  Resp: 19 18 18 18  Temp: 98.8 F (37.1 C) 98.1 F (36.7 C) 98.5 F (36.9 C) 98.4 F (36.9 C)  TempSrc: Oral Oral Oral Oral  SpO2: 100% 99% 99% 100%  Weight:      Height:       General: alert, cooperative, and no distress Lochia: appropriate Uterine Fundus: firm Incision: N/A DVT  Evaluation: No significant calf/ankle edema. Labs: Lab Results  Component Value Date   WBC 12.4 (H) 04/15/2022   HGB 12.2 04/15/2022   HCT 36.6 04/15/2022   MCV 77.5 (L) 04/15/2022   PLT 333 04/15/2022      Latest Ref Rng & Units 10/26/2020    2:22 PM  CMP  Glucose 70 - 99 mg/dL 91   BUN 6 - 20 mg/dL 10   Creatinine 0.44 - 1.00 mg/dL 0.79   Sodium 135 - 145 mmol/L 138   Potassium 3.5 - 5.1 mmol/L 3.5   Chloride 98 - 111 mmol/L 104   CO2 22 - 32 mmol/L 26   Calcium 8.9 - 10.3 mg/dL 9.5   Total Protein 6.5 - 8.1 g/dL 8.1   Total Bilirubin 0.3 - 1.2 mg/dL 0.6   Alkaline Phos 38 - 126 U/L 54   AST 15 - 41 U/L 17   ALT 0 - 44 U/L 13    Edinburgh Score:    04/16/2022    6:50 PM  Edinburgh Postnatal Depression Scale Screening Tool  I have been able to laugh and see the funny side of things. 0  I have looked forward with enjoyment to things. 0  I have blamed myself unnecessarily when things went wrong. 1  I have been anxious or worried for no good reason. 2  I have felt scared or panicky for   no good reason. 0  Things have been getting on top of me. 1  I have been so unhappy that I have had difficulty sleeping. 1  I have felt sad or miserable. 1  I have been so unhappy that I have been crying. 0  The thought of harming myself has occurred to me. 0  Edinburgh Postnatal Depression Scale Total 6     After visit meds:  Allergies as of 04/18/2022   No Known Allergies      Medication List     STOP taking these medications    aspirin EC 81 MG tablet   Blood Pressure Kit Devi   terconazole 0.4 % vaginal cream Commonly known as: TERAZOL 7       TAKE these medications    acetaminophen 325 MG tablet Commonly known as: Tylenol Take 2 tablets (650 mg total) by mouth every 4 (four) hours as needed (for pain scale < 4).   ibuprofen 600 MG tablet Commonly known as: ADVIL Take 1 tablet (600 mg total) by mouth every 6 (six) hours.   Vitafol Gummies 3.33-0.333-34.8 MG  Chew Chew 3 tablets by mouth daily.         Discharge home in stable condition Infant Feeding: Bottle Infant Disposition:home with mother Discharge instruction: per After Visit Summary and Postpartum booklet. Activity: Advance as tolerated. Pelvic rest for 6 weeks.  Diet: routine diet Future Appointments: Future Appointments  Date Time Provider Department Center  05/26/2022 10:55 AM Leftwich-Kirby, Lisa A, CNM [facility] None   Follow up Visit:  Follow-up Information     [facility] Follow up in 6 week(s).   Specialty: Obstetrics and Gynecology Why: Postpartum visit Contact information: [address], [address] [place] [address] [phone]                Shaw, Kimberly D, CNM  P [facility] Please schedule this patient for Postpartum visit in: 6 weeks with the following provider: Any provider In-Person For C/S patients schedule nurse incision check in weeks 2 weeks: no Low risk pregnancy complicated by: none Delivery mode:  SVD Anticipated Birth Control:  other/unsure PP Procedures needed: none Schedule Integrated BH visit: no   04/18/2022 Paula Duncan, MD    

## 2022-04-16 NOTE — Anesthesia Postprocedure Evaluation (Signed)
Anesthesia Post Note  Patient: Denise Tanner  Procedure(s) Performed: AN AD HOC EPIDURAL     Patient location during evaluation: Mother Baby Anesthesia Type: Epidural Level of consciousness: awake and alert and oriented Pain management: satisfactory to patient Vital Signs Assessment: post-procedure vital signs reviewed and stable Respiratory status: respiratory function stable Cardiovascular status: stable Postop Assessment: no headache, no backache, epidural receding, patient able to bend at knees, no signs of nausea or vomiting, adequate PO intake and able to ambulate Anesthetic complications: no   No notable events documented.  Last Vitals:  Vitals:   04/16/22 0806 04/16/22 1153  BP: 121/71 118/79  Pulse: 86 72  Resp: 17 18  Temp: 36.7 C 36.4 C  SpO2: 99% 100%    Last Pain:  Vitals:   04/16/22 1153  TempSrc: Oral  PainSc:    Pain Goal: Patients Stated Pain Goal: 3 (04/16/22 0806)                 Karleen Dolphin

## 2022-04-17 ENCOUNTER — Encounter: Payer: Medicaid Other | Admitting: Obstetrics and Gynecology

## 2022-04-17 LAB — SURGICAL PATHOLOGY

## 2022-04-17 NOTE — Plan of Care (Signed)
  Problem: Education: Goal: Knowledge of Childbirth will improve Outcome: Progressing Goal: Ability to make informed decisions regarding treatment and plan of care will improve Outcome: Progressing Goal: Ability to state and carry out methods to decrease the pain will improve Outcome: Progressing Goal: Individualized Educational Video(s) Outcome: Progressing

## 2022-04-17 NOTE — Plan of Care (Signed)
  Problem: Role Relationship: Goal: Will demonstrate positive interactions with the child Outcome: Progressing   Problem: Education: Goal: Knowledge of General Education information will improve Description: Including pain rating scale, medication(s)/side effects and non-pharmacologic comfort measures Outcome: Progressing   Problem: Health Behavior/Discharge Planning: Goal: Ability to manage health-related needs will improve Outcome: Progressing   Problem: Clinical Measurements: Goal: Ability to maintain clinical measurements within normal limits will improve Outcome: Progressing Goal: Will remain free from infection Outcome: Progressing Goal: Diagnostic test results will improve Outcome: Progressing Goal: Respiratory complications will improve Outcome: Progressing Goal: Cardiovascular complication will be avoided Outcome: Progressing   Problem: Activity: Goal: Risk for activity intolerance will decrease Outcome: Progressing   Problem: Nutrition: Goal: Adequate nutrition will be maintained Outcome: Progressing   Problem: Coping: Goal: Level of anxiety will decrease Outcome: Progressing   Problem: Elimination: Goal: Will not experience complications related to bowel motility Outcome: Progressing Goal: Will not experience complications related to urinary retention Outcome: Progressing   Problem: Pain Managment: Goal: General experience of comfort will improve Outcome: Progressing   Problem: Safety: Goal: Ability to remain free from injury will improve Outcome: Progressing   Problem: Skin Integrity: Goal: Risk for impaired skin integrity will decrease Outcome: Progressing   Problem: Education: Goal: Knowledge of condition will improve Outcome: Progressing Goal: Individualized Educational Video(s) Outcome: Progressing Goal: Individualized Newborn Educational Video(s) Outcome: Progressing   Problem: Activity: Goal: Will verbalize the importance of balancing  activity with adequate rest periods Outcome: Progressing Goal: Ability to tolerate increased activity will improve Outcome: Progressing   Problem: Coping: Goal: Ability to identify and utilize available resources and services will improve Outcome: Progressing   Problem: Life Cycle: Goal: Chance of risk for complications during the postpartum period will decrease Outcome: Progressing   Problem: Role Relationship: Goal: Ability to demonstrate positive interaction with newborn will improve Outcome: Progressing   Problem: Skin Integrity: Goal: Demonstration of wound healing without infection will improve Outcome: Progressing

## 2022-04-17 NOTE — Progress Notes (Signed)
Post Partum Day 1 s/p SVD Subjective: no complaints, up ad lib, voiding, tolerating PO, and + flatus  Objective: Blood pressure 117/67, pulse 73, temperature 98 F (36.7 C), temperature source Oral, resp. rate 18, height 5\' 5"  (1.651 m), weight 90.3 kg, last menstrual period 06/19/2021, SpO2 100 %, unknown if currently breastfeeding.  Physical Exam:  General: alert, cooperative, and no distress Lochia: appropriate Uterine Fundus: firm Incision: n/a DVT Evaluation: No significant calf/ankle edema.  Recent Labs    04/15/22 0300  HGB 12.2  HCT 36.6    Assessment/Plan: Postpartum - Contraception: Declines - MOF: Breast - Rh status: Positive - Rubella status: Immune - Dispo: PPD2 - Consults: None  Neonatal - Doing well - Circumcision:  Parent desires circumcision for this female infant.  Circumcision procedure details discussed, risks and benefits of procedure were also discussed.  The benefits include but are not limited to: reduction in the rates of urinary tract infection (UTI), penile cancer, sexually transmitted infections including HIV, penile inflammatory and retractile disorders.  Circumcision also helps obtain better and easier hygiene of the penis.  Risks include but are not limited to: bleeding, infection, injury of glans which may lead to penile deformity or urinary tract issues or Urology intervention, unsatisfactory cosmetic appearance and other potential complications related to the procedure.  It was emphasized that this is an elective procedure.    LOS: 2 days   13/08/23 04/17/2022, 2:54 PM

## 2022-04-17 NOTE — Plan of Care (Signed)
  Problem: Education: Goal: Knowledge of Childbirth will improve Outcome: Completed/Met Goal: Ability to make informed decisions regarding treatment and plan of care will improve Outcome: Completed/Met Goal: Ability to state and carry out methods to decrease the pain will improve Outcome: Completed/Met Goal: Individualized Educational Video(s) Outcome: Completed/Met   Problem: Coping: Goal: Ability to verbalize concerns and feelings about labor and delivery will improve Outcome: Completed/Met   Problem: Life Cycle: Goal: Ability to make normal progression through stages of labor will improve Outcome: Completed/Met Goal: Ability to effectively push during vaginal delivery will improve Outcome: Completed/Met   Problem: Safety: Goal: Risk of complications during labor and delivery will decrease Outcome: Completed/Met   Problem: Pain Management: Goal: Relief or control of pain from uterine contractions will improve Outcome: Completed/Met

## 2022-04-18 MED ORDER — ACETAMINOPHEN 325 MG PO TABS: 650.0000 mg | ORAL_TABLET | ORAL | 0 refills | Status: DC | PRN

## 2022-04-18 MED ORDER — IBUPROFEN 600 MG PO TABS: 600.0000 mg | ORAL_TABLET | Freq: Four times a day (QID) | ORAL | 0 refills | Status: DC

## 2022-04-18 NOTE — Plan of Care (Signed)
  Problem: Role Relationship: Goal: Will demonstrate positive interactions with the child Outcome: Completed/Met   Problem: Education: Goal: Knowledge of General Education information will improve Description: Including pain rating scale, medication(s)/side effects and non-pharmacologic comfort measures Outcome: Completed/Met   Problem: Health Behavior/Discharge Planning: Goal: Ability to manage health-related needs will improve Outcome: Completed/Met   Problem: Clinical Measurements: Goal: Ability to maintain clinical measurements within normal limits will improve Outcome: Completed/Met Goal: Will remain free from infection Outcome: Completed/Met Goal: Diagnostic test results will improve Outcome: Completed/Met Goal: Respiratory complications will improve Outcome: Completed/Met Goal: Cardiovascular complication will be avoided Outcome: Completed/Met   Problem: Activity: Goal: Risk for activity intolerance will decrease Outcome: Completed/Met   Problem: Nutrition: Goal: Adequate nutrition will be maintained Outcome: Completed/Met   Problem: Coping: Goal: Level of anxiety will decrease Outcome: Completed/Met   Problem: Elimination: Goal: Will not experience complications related to bowel motility Outcome: Completed/Met Goal: Will not experience complications related to urinary retention Outcome: Completed/Met   Problem: Pain Managment: Goal: General experience of comfort will improve Outcome: Completed/Met   Problem: Safety: Goal: Ability to remain free from injury will improve Outcome: Completed/Met   Problem: Skin Integrity: Goal: Risk for impaired skin integrity will decrease Outcome: Completed/Met   Problem: Education: Goal: Knowledge of condition will improve Outcome: Completed/Met Goal: Individualized Educational Video(s) Outcome: Completed/Met Goal: Individualized Newborn Educational Video(s) Outcome: Completed/Met   Problem: Activity: Goal: Will  verbalize the importance of balancing activity with adequate rest periods Outcome: Completed/Met Goal: Ability to tolerate increased activity will improve Outcome: Completed/Met   Problem: Coping: Goal: Ability to identify and utilize available resources and services will improve Outcome: Completed/Met   Problem: Life Cycle: Goal: Chance of risk for complications during the postpartum period will decrease Outcome: Completed/Met   Problem: Role Relationship: Goal: Ability to demonstrate positive interaction with newborn will improve Outcome: Completed/Met   Problem: Skin Integrity: Goal: Demonstration of wound healing without infection will improve Outcome: Completed/Met

## 2022-04-24 ENCOUNTER — Encounter: Payer: Medicaid Other | Admitting: Obstetrics and Gynecology

## 2022-04-25 ENCOUNTER — Telehealth (HOSPITAL_COMMUNITY): Payer: Self-pay

## 2022-04-25 DIAGNOSIS — Z1331 Encounter for screening for depression: Secondary | ICD-10-CM

## 2022-04-25 NOTE — Telephone Encounter (Signed)
Patient reports feeling fine. Patient declines questions/concerns about her health and healing.  Patient reports that baby is doing well. Eating, peeing/pooping, and gaining weight well. Patient has questions about peeling skin and milia. RN reviewed normal skin findings with patient. Baby sleeps in a bassinet. RN reviewed ABC's of safe sleep with patient. Patient declines any questions or concerns about baby.  EPDS score is 14. Referral placed for integrated behavioral health.RN told patient about Maternal Mental Health Resources ([facility], National Maternal Mental Health Hotline, [employer]). Also told patient about [facility] support group offerings. Patient states that she does not have access to her e-mail. Patient wrote resources down while we were on the phone  Suann Larry 04/25/22,1237

## 2022-04-28 ENCOUNTER — Encounter (HOSPITAL_COMMUNITY): Payer: Self-pay | Admitting: Obstetrics and Gynecology

## 2022-05-26 ENCOUNTER — Ambulatory Visit: Payer: Medicaid Other | Admitting: Obstetrics and Gynecology

## 2022-05-27 ENCOUNTER — Encounter: Payer: Self-pay | Admitting: Obstetrics

## 2022-05-27 ENCOUNTER — Ambulatory Visit (INDEPENDENT_AMBULATORY_CARE_PROVIDER_SITE_OTHER): Payer: Medicaid Other | Admitting: Obstetrics

## 2022-05-27 ENCOUNTER — Other Ambulatory Visit (HOSPITAL_COMMUNITY)
Admission: RE | Admit: 2022-05-27 | Discharge: 2022-05-27 | Disposition: A | Discharge status: Home / Self Care | Payer: Medicaid Other | Source: Ambulatory Visit | Attending: Advanced Practice Midwife | Admitting: Advanced Practice Midwife

## 2022-05-27 DIAGNOSIS — Z3009 Encounter for other general counseling and advice on contraception: Secondary | ICD-10-CM | POA: Diagnosis not present

## 2022-05-27 NOTE — Progress Notes (Signed)
..   Post Partum Visit Note  Denise Tanner is a 21 y.o. [phone] female who presents for a postpartum visit. She is 6 weeks postpartum following a normal spontaneous vaginal delivery.  I have fully reviewed the prenatal and intrapartum course. The delivery was at 39.4 gestational weeks.  Anesthesia: epidural. Postpartum course has been good. Baby is doing well. Baby is feeding by bottle - Similac 360 . Bleeding no bleeding. Bowel function is normal. Bladder function is normal. Patient is sexually active. Contraception method is none. Postpartum depression screening: negative.   The pregnancy intention screening data noted above was reviewed. Potential methods of contraception were discussed. The patient elected to proceed with No data recorded.   Edinburgh Postnatal Depression Scale - 05/27/22 1527       Edinburgh Postnatal Depression Scale:  In the Past 7 Days   I have been able to laugh and see the funny side of things. 0    I have looked forward with enjoyment to things. 1    I have blamed myself unnecessarily when things went wrong. 0    I have been anxious or worried for no good reason. 0    I have felt scared or panicky for no good reason. 0    Things have been getting on top of me. 0    I have been so unhappy that I have had difficulty sleeping. 0    I have felt sad or miserable. 1    I have been so unhappy that I have been crying. 0    The thought of harming myself has occurred to me. 0    Edinburgh Postnatal Depression Scale Total 2             Health Maintenance Due  Topic Date Due   HPV VACCINES (1 - 2-dose series) Never done   DTaP/Tdap/Td (1 - Tdap) Never done   INFLUENZA VACCINE  Never done    The following portions of the patient's history were reviewed and updated as appropriate: allergies, current medications, past family history, past medical history, past social history, past surgical history, and problem list.  Review of Systems A comprehensive review  of systems was negative.  Objective:  BP 107/66   Pulse 78   Wt 173 lb 14.4 oz (78.9 kg)   LMP 06/19/2021 (Exact Date) Comment: neg test at clinic 2/6 ?test  BMI 28.94 kg/m    General:  alert and no distress   Breasts:  normal  Lungs: clear to auscultation bilaterally  Heart:  regular rate and rhythm, S1, S2 normal, no murmur, click, rub or gallop and normal apical impulse  Abdomen: soft, non-tender; bowel sounds normal; no masses,  no organomegaly   Wound none  GU exam:  not indicated       Assessment:   1. Postpartum care following vaginal delivery Rx: - Cervicovaginal ancillary only( [place])  2. Encounter for other general counseling and advice on contraception - declines hormonal contraception -- condoms recommended for STI prevention and contraception   Plan:   Essential components of care per ACOG recommendations:  1.  Mood and well being: Patient with negative depression screening today. Reviewed local resources for support.  - Patient tobacco use? No.   - hx of drug use? No.    2. Infant care and feeding:  -Patient currently breastmilk feeding? No.  -Social determinants of health (SDOH) reviewed in EPIC. No concerns  3. Sexuality, contraception and birth spacing - Patient does not  want a pregnancy in the next year.  Desired family size is 1 children.  - Reviewed reproductive life planning. Reviewed contraceptive methods based on pt preferences and effectiveness.  Patient desired Female Condom today.   - Discussed birth spacing of 18 months  4. Sleep and fatigue -Encouraged family/partner/community support of 4 hrs of uninterrupted sleep to help with mood and fatigue  5. Physical Recovery  - Discussed patients delivery and complications. She describes her labor as good. - Patient had a Vaginal, no problems at delivery. Patient had  no  laceration.  - Patient has urinary incontinence? No. - Patient is safe to resume physical and sexual activity  6.   Health Maintenance - HM due items addressed Yes - Last pap smear  Diagnosis  Date Value Ref Range Status  07/23/2021   Final   - Negative for intraepithelial lesion or malignancy (NILM)   Pap smear not done at today's visit.  -Breast Cancer screening indicated? No.   7. Chronic Disease/Pregnancy Condition follow up: None   Coral Ceo, MD [facility], [facility], [place] 05/27/2022

## 2022-05-28 LAB — CERVICOVAGINAL ANCILLARY ONLY
Bacterial Vaginitis (gardnerella): POSITIVE — AB
Candida Glabrata: NEGATIVE
Candida Vaginitis: POSITIVE — AB
Chlamydia: NEGATIVE
Comment: NEGATIVE
Comment: NEGATIVE
Comment: NEGATIVE
Comment: NEGATIVE
Comment: NEGATIVE
Comment: NORMAL
Neisseria Gonorrhea: NEGATIVE
Trichomonas: NEGATIVE

## 2022-05-29 ENCOUNTER — Telehealth: Payer: Self-pay | Admitting: Emergency Medicine

## 2022-05-29 ENCOUNTER — Other Ambulatory Visit: Payer: Self-pay | Admitting: Obstetrics

## 2022-05-29 ENCOUNTER — Other Ambulatory Visit: Payer: Self-pay | Admitting: Emergency Medicine

## 2022-05-29 MED ORDER — METRONIDAZOLE 500 MG PO TABS: 500.0000 mg | ORAL_TABLET | Freq: Two times a day (BID) | ORAL | 0 refills | Status: DC

## 2022-05-29 MED ORDER — FLUCONAZOLE 150 MG PO TABS: 150.0000 mg | ORAL_TABLET | Freq: Once | ORAL | 0 refills | Status: AC

## 2022-05-29 NOTE — Telephone Encounter (Signed)
Pt informed of results via mychart.  Rx sent to pharmacy.

## 2022-05-29 NOTE — Progress Notes (Signed)
Rx for yeast, BV sent per protocol.

## 2022-05-29 NOTE — Telephone Encounter (Signed)
-----   Message from Brock Bad, MD sent at 05/29/2022  9:26 AM EST ----- Flagyl Rx for BV Diflucan Rx for yeast

## 2022-07-29 ENCOUNTER — Ambulatory Visit: Payer: Medicaid Other | Admitting: Obstetrics and Gynecology

## 2022-09-10 ENCOUNTER — Ambulatory Visit: Payer: Medicaid Other | Admitting: Student

## 2022-09-10 NOTE — Progress Notes (Deleted)
ANNUAL EXAM Patient name: Denise Tanner MRN [id number]  Date of birth: 2000-11-28 Chief Complaint:   No chief complaint on file.  History of Present Illness:   Denise Tanner is a 22 y.o. [phone] {race:[id number]} female being seen today for a routine annual exam.  Current complaints: ***  No LMP recorded.   The pregnancy intention screening data noted above was reviewed. Potential methods of contraception were discussed. The patient elected to proceed with No data recorded.   Last pap 07/2021. Results were: NILM w/ HRHPV not done. H/O abnormal pap: no Last mammogram: n/a. Results were: N/A. Family h/o breast cancer: {yes***/no:23838} Last colonoscopy: n/a. Results were: N/A. Family h/o colorectal cancer: {yes***/no:23838}     09/22/2021    2:09 PM  Depression screen PHQ 2/9  Decreased Interest 0  Down, Depressed, Hopeless 0  PHQ - 2 Score 0  Altered sleeping 0  Tired, decreased energy 2  Change in appetite 0  Feeling bad or failure about yourself  0  Trouble concentrating 0  Moving slowly or fidgety/restless 0  Suicidal thoughts 0  PHQ-9 Score 2  Difficult doing work/chores Not difficult at all        09/22/2021    2:10 PM  GAD 7 : Generalized Anxiety Score  Nervous, Anxious, on Edge 0  Control/stop worrying 0  Worry too much - different things 0  Trouble relaxing 0  Restless 0  Easily annoyed or irritable 0  Afraid - awful might happen 0  Total GAD 7 Score 0  Anxiety Difficulty Not difficult at all     Review of Systems:   Pertinent items are noted in HPI Denies any headaches, blurred vision, fatigue, shortness of breath, chest pain, abdominal pain, abnormal vaginal discharge/itching/odor/irritation, problems with periods, bowel movements, urination, or intercourse unless otherwise stated above. Pertinent History Reviewed:  Reviewed past medical,surgical, social and family history.  Reviewed problem list, medications and allergies. Physical Assessment:   There were no vitals filed for this visit.There is no height or weight on file to calculate BMI.        Physical Examination:   General appearance - well appearing, and in no distress  Mental status - alert, oriented to person, place, and time  Psych:  She has a normal mood and affect  Skin - warm and dry, normal color, no suspicious lesions noted  Chest - effort normal, all lung fields clear to auscultation bilaterally  Heart - normal rate and regular rhythm  Neck:  midline trachea, no thyromegaly or nodules  Breasts - breasts appear normal, no suspicious masses, no skin or nipple changes or  axillary nodes  Abdomen - soft, nontender, nondistended, no masses or organomegaly  Pelvic - VULVA: normal appearing vulva with no masses, tenderness or lesions  VAGINA: normal appearing vagina with normal color and discharge, no lesions  CERVIX: normal appearing cervix without discharge or lesions, no CMT  Thin prep pap is {Desc; done/not:10129} *** HR HPV cotesting  UTERUS: uterus is felt to be normal size, shape, consistency and nontender   ADNEXA: No adnexal masses or tenderness noted.  Rectal - normal rectal, good sphincter tone, no masses felt. Hemoccult: ***  Extremities:  No swelling or varicosities noted  Chaperone present for exam  No results found for this or any previous visit (from the past 24 hour(s)).  Assessment & Plan:  1) Well-Woman Exam  2) ***  Labs/procedures today: ***  Mammogram: @ 23yo, or sooner if problems Colonoscopy: @ 23yo,  or sooner if problems  No orders of the defined types were placed in this encounter.   Meds: No orders of the defined types were placed in this encounter.   Follow-up: No follow-ups on file.  Johnston Ebbs, NP 09/10/2022 7:26 AM

## 2022-11-17 ENCOUNTER — Ambulatory Visit: Payer: Medicaid Other | Admitting: Obstetrics

## 2023-01-04 ENCOUNTER — Ambulatory Visit
Admission: EM | Admit: 2023-01-04 | Discharge: 2023-01-04 | Disposition: A | Discharge status: Home / Self Care | Payer: Medicaid Other | Attending: Physician Assistant | Admitting: Physician Assistant

## 2023-01-04 DIAGNOSIS — Z3490 Encounter for supervision of normal pregnancy, unspecified, unspecified trimester: Secondary | ICD-10-CM | POA: Diagnosis not present

## 2023-01-04 LAB — POCT URINE PREGNANCY: Preg Test, Ur: POSITIVE — AB

## 2023-01-04 NOTE — ED Triage Notes (Signed)
Recent pregnancy test at home "and needed to confirm".

## 2023-01-04 NOTE — ED Provider Notes (Signed)
[facility]    CSN: [id number] Arrival date & time: 01/04/23  1520      History   Chief Complaint Chief Complaint  Patient presents with   Possible Pregnancy    HPI Denise Tanner is a 22 y.o. female.   Patient here today for confirmation of pregnancy.  She reports that her last menstrual period was November 22, 2022.  She reports some mild abdominal discomfort with some nausea.  She denies any vaginal bleeding.  She currently has an 53-month-old son.  The history is provided by the patient.    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 Authorizing Provider  acetaminophen (TYLENOL) 325 MG tablet Take 2 tablets (650 mg total) by mouth every 4 (four) hours as needed (for pain scale < 4). Patient not taking: Reported on 05/27/2022 04/18/22   Milas Hock, MD  ibuprofen (ADVIL) 600 MG tablet Take 1 tablet (600 mg total) by mouth every 6 (six) hours. Patient not taking: Reported on 05/27/2022 04/18/22   Milas Hock, MD  metroNIDAZOLE (FLAGYL) 500 MG tablet Take 1 tablet (500 mg total) by mouth 2 (two) times daily. 05/29/22   Brock Bad, MD  Prenatal Vit-Fe Phos-FA-Omega (VITAFOL GUMMIES) 3.33-0.333-34.8 MG CHEW Chew 3 tablets by mouth daily. Patient not taking: Reported on 05/27/2022 09/30/21   Sharen Counter  A, CNM  ipratropium (ATROVENT) 0.03 % nasal spray Place 2 sprays into both nostrils 2 (two) times daily. Patient not taking: Reported on 03/25/2020 07/16/19 10/21/20  Bing Neighbors, NP  medroxyPROGESTERone (DEPO-PROVERA) 150 MG/ML injection Inject 150 mg into the muscle every 3 (three) months.  11/24/18  [provider]  sodium chloride (OCEAN) 0.65 % SOLN nasal spray Place 1 spray into both nostrils as needed for congestion. And to moisturize nose 02/11/19 04/04/19  Georgetta Haber, NP    Family History Family History  Problem Relation Age of Onset   Sickle cell anemia Mother    Hypothyroidism Mother    Healthy Father        was killed    Social History Social History   Tobacco Use   Smoking status: Never    Passive exposure: Never   Smokeless tobacco: Never  Vaping Use   Vaping status: Never Used  Substance Use Topics   Alcohol use: No   Drug use: No     Allergies   Patient has no known allergies.   Review of Systems Review of Systems  Constitutional:  Negative for chills and fever.  Eyes:  Negative  for discharge and redness.  Gastrointestinal:  Positive for abdominal pain and nausea. Negative for vomiting.  Genitourinary:  Positive for menstrual problem.     Physical Exam Triage Vital Signs ED Triage Vitals  Encounter Vitals Group     BP      Systolic BP Percentile      Diastolic BP Percentile      Pulse      Resp      Temp      Temp src      SpO2      Weight      Height      Head Circumference      Peak Flow      Pain Score      Pain Loc      Pain Education      Exclude from Growth Chart    No data found.  Updated Vital Signs BP 112/77 (BP Location: Left Arm)   Pulse 83   Temp 99 F (37.2 C) (Oral)   Resp 18   Ht 5\' 5"  (1.651 m)   Wt 161 lb (73 kg)   LMP 11/22/2022 (Exact Date)   SpO2 99%   Breastfeeding Yes   BMI 26.79 kg/m     Physical Exam Vitals and nursing note reviewed.  Constitutional:      General: She is not in  acute distress.    Appearance: Normal appearance. She is not ill-appearing.  HENT:     Head: Normocephalic and atraumatic.  Eyes:     Conjunctiva/sclera: Conjunctivae normal.  Cardiovascular:     Rate and Rhythm: Normal rate.  Pulmonary:     Effort: Pulmonary effort is normal. No respiratory distress.  Neurological:     Mental Status: She is alert.  Psychiatric:        Mood and Affect: Mood normal.        Behavior: Behavior normal.        Thought Content: Thought content normal.      UC Treatments / Results  Labs (all labs ordered are listed, but only abnormal results are displayed) Labs Reviewed  POCT URINE PREGNANCY - Abnormal; Notable for the following components:      Result Value   Preg Test, Ur Positive (*)    All other components within normal limits  POCT URINALYSIS DIP (MANUAL ENTRY)    EKG   Radiology No results found.  Procedures Procedures (including critical care time)  Medications Ordered in UC Medications - No data to display  Initial Impression / Assessment and Plan / UC Course  I have reviewed the triage vital signs and the nursing notes.  Pertinent labs & imaging results that were available during my care of the patient were reviewed by me and considered in my medical decision making (see chart for details).    Pregnancy confirmed in office.  Recommended follow-up with OB and patient states she should still be established with her OB from prior pregnancy.  Encouraged follow-up with any further concerns and evaluation emergency room with any worsening abdominal pain or vaginal bleeding.  Final Clinical Impressions(s) / UC Diagnoses   Final diagnoses:  Pregnancy, unspecified gestational age   Discharge Instructions   None    ED Prescriptions   None    PDMP not reviewed this encounter.   Tomi Bamberger, PA-C 01/04/23 [id number]

## 2023-01-06 ENCOUNTER — Ambulatory Visit: Payer: Medicaid Other

## 2023-01-09 ENCOUNTER — Encounter (HOSPITAL_COMMUNITY): Payer: Self-pay

## 2023-01-09 ENCOUNTER — Ambulatory Visit (HOSPITAL_COMMUNITY)
Admission: EM | Admit: 2023-01-09 | Discharge: 2023-01-09 | Disposition: A | Discharge status: Home / Self Care | Payer: Medicaid Other

## 2023-01-09 DIAGNOSIS — S29012A Strain of muscle and tendon of back wall of thorax, initial encounter: Secondary | ICD-10-CM | POA: Diagnosis not present

## 2023-01-09 NOTE — ED Provider Notes (Signed)
[facility]    CSN: [id number] Arrival date & time: 01/09/23  1124      History   Chief Complaint No chief complaint on file.   HPI Denise Tanner is a 22 y.o. female.   Patient presents to urgent care for evaluation of pain to the upper left back that started yesterday. No recent trauma/injuries to the upper back/shoulders, paresthesias to the extremities or weakness to the extremities. No recent cough, fever/chills, or viral URI symptoms. No rash. She lifts her 33 month old child frequently and believes this makes pain worse. Movement also makes pain worse. The heat in the shower makes pain better. Has not attempted treatment of symptoms PTA with medications. Just recently found out that she is pregnant.      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 Authorizing Provider  acetaminophen (TYLENOL) 325 MG tablet Take 2 tablets (650 mg total) by mouth every 4 (four) hours as needed (for pain scale < 4). Patient not taking: Reported on 05/27/2022 04/18/22   Milas Hock, MD  ibuprofen (ADVIL) 600 MG tablet Take 1 tablet (600 mg total) by mouth every 6 (six) hours. Patient not taking: Reported on 05/27/2022 04/18/22   Milas Hock, MD  metroNIDAZOLE (FLAGYL) 500 MG tablet Take 1 tablet (500 mg total) by mouth 2 (two) times  daily. 05/29/22   Brock Bad, MD  Prenatal Vit-Fe Phos-FA-Omega (VITAFOL GUMMIES) 3.33-0.333-34.8 MG CHEW Chew 3 tablets by mouth daily. Patient not taking: Reported on 05/27/2022 09/30/21   Sharen Counter A, CNM  ipratropium (ATROVENT) 0.03 % nasal spray Place 2 sprays into both nostrils 2 (two) times daily. Patient not taking: Reported on 03/25/2020 07/16/19 10/21/20  Bing Neighbors, NP  medroxyPROGESTERone (DEPO-PROVERA) 150 MG/ML injection Inject 150 mg into the muscle every 3 (three) months.  11/24/18  [provider]  sodium chloride (OCEAN) 0.65 % SOLN nasal spray Place 1 spray into both nostrils as needed for congestion. And to moisturize nose 02/11/19 04/04/19  Georgetta Haber, NP    Family History Family History  Problem Relation Age of Onset   Sickle cell anemia Mother    Hypothyroidism Mother    Healthy Father        was killed    Social History Social History   Tobacco Use   Smoking status: Never    Passive exposure: Never   Smokeless tobacco: Never  Vaping Use   Vaping status: Never Used  Substance Use Topics   Alcohol use: No   Drug use:  No     Allergies   Patient has no known allergies.   Review of Systems Review of Systems Per HPI  Physical Exam Triage Vital Signs ED Triage Vitals  Encounter Vitals Group     BP 01/09/23 1320 108/72     Systolic BP Percentile --      Diastolic BP Percentile --      Pulse Rate 01/09/23 1320 85     Resp 01/09/23 1320 18     Temp 01/09/23 1320 98 F (36.7 C)     Temp Source 01/09/23 1320 Oral     SpO2 01/09/23 1320 97 %     Weight --      Height --      Head Circumference --      Peak Flow --      Pain Score 01/09/23 1318 10     Pain Loc --      Pain Education --      Exclude from Growth Chart --    No data found.  Updated Vital Signs BP 108/72 (BP Location: Right Arm)   Pulse 85   Temp 98 F (36.7 C) (Oral)   Resp 18   LMP 11/22/2022 (Exact Date)   SpO2 97%   Visual  Acuity Right Eye Distance:   Left Eye Distance:   Bilateral Distance:    Right Eye Near:   Left Eye Near:    Bilateral Near:     Physical Exam Vitals and nursing note reviewed.  Constitutional:      Appearance: She is not ill-appearing or toxic-appearing.  HENT:     Head: Normocephalic and atraumatic.     Right Ear: Hearing and external ear normal.     Left Ear: Hearing and external ear normal.     Nose: Nose normal.     Mouth/Throat:     Lips: Pink.     Mouth: Mucous membranes are moist. No injury.     Tongue: No lesions. Tongue does not deviate from midline.     Palate: No mass and lesions.     Pharynx: Oropharynx is clear. Uvula midline. No pharyngeal swelling, oropharyngeal exudate, posterior oropharyngeal erythema or uvula swelling.     Tonsils: No tonsillar exudate or tonsillar abscesses.  Eyes:     General: Lids are normal. Vision grossly intact. Gaze aligned appropriately.     Extraocular Movements: Extraocular movements intact.     Conjunctiva/sclera: Conjunctivae normal.  Pulmonary:     Effort: Pulmonary effort is normal.  Musculoskeletal:     Cervical back: Normal and neck supple.     Thoracic back: Tenderness present. No swelling, edema, deformity, signs of trauma, lacerations, spasms or bony tenderness. Normal range of motion. No scoliosis.     Lumbar back: Normal.       Back:  Skin:    General: Skin is warm and dry.     Capillary Refill: Capillary refill takes less than 2 seconds.     Findings: No rash.  Neurological:     General: No focal deficit present.     Mental Status: She is alert and oriented to person, place, and time. Mental status is at baseline.     Cranial Nerves: Cranial nerves 2-12 are intact. No dysarthria or facial asymmetry.     Sensory: Sensation is intact.     Motor: Motor function is intact.     Coordination: Coordination is intact.     Comments: Strength and sensation intact to bilateral upper and lower  extremities (5/5). Moves all 4  extremities with normal coordination voluntarily. Non-focal neuro exam.   Psychiatric:        Mood and Affect: Mood normal.        Speech: Speech normal.        Behavior: Behavior normal.        Thought Content: Thought content normal.        Judgment: Judgment normal.      UC Treatments / Results  Labs (all labs ordered are listed, but only abnormal results are displayed) Labs Reviewed - No data to display  EKG   Radiology No results found.  Procedures Procedures (including critical care time)  Medications Ordered in UC Medications - No data to display  Initial Impression / Assessment and Plan / UC Course  I have reviewed the triage vital signs and the nursing notes.  Pertinent labs & imaging results that were available during my care of the patient were reviewed by me and considered in my medical decision making (see chart for details).   1.  Muscle strain of the left upper back Evaluation suggests pain is muscular in nature. Will manage this with rest, gentle ROM exercises, heat therapy, Tylenol as needed for pain.  Patient is pregnant and is not a candidate for NSAIDs or muscle relaxer.  May follow-up with orthopedics as needed.  Counseled patient on potential for adverse effects with medications prescribed/recommended today, strict ER and return-to-clinic precautions discussed, patient verbalized understanding.    Final Clinical Impressions(s) / UC Diagnoses   Final diagnoses:  Muscle strain of left upper back, initial encounter     Discharge Instructions      Your pain is likely due to a muscle strain which will improve on its own with time.   - You may take over the counter medicines to help with pain (tylenol since you are pregnant). - Apply heat 20 minutes on then 20 minutes off and perform gentle range of motion exercises to the area of greatest pain to prevent muscle stiffness and provide further pain relief.   If you develop any new or worsening  symptoms or if your symptoms do not start to improve, please return here or follow-up with your primary care provider. If your symptoms are severe, please go to the emergency room.     ED Prescriptions   None    PDMP not reviewed this encounter.   Carlisle Beers, [place] 01/09/23 1352

## 2023-01-09 NOTE — ED Triage Notes (Signed)
Pt reports the back of her left shoulder has a sharp pain x 1 day.

## 2023-01-09 NOTE — Discharge Instructions (Signed)
Your pain is likely due to a muscle strain which will improve on its own with time.   - You may take over the counter medicines to help with pain (tylenol since you are pregnant). - Apply heat 20 minutes on then 20 minutes off and perform gentle range of motion exercises to the area of greatest pain to prevent muscle stiffness and provide further pain relief.   If you develop any new or worsening symptoms or if your symptoms do not start to improve, please return here or follow-up with your primary care provider. If your symptoms are severe, please go to the emergency room.

## 2023-01-14 ENCOUNTER — Ambulatory Visit: Payer: Medicaid Other

## 2023-01-18 ENCOUNTER — Ambulatory Visit: Payer: Medicaid Other

## 2023-01-22 ENCOUNTER — Inpatient Hospital Stay (HOSPITAL_COMMUNITY)
Admission: AD | Admit: 2023-01-22 | Discharge: 2023-01-22 | Disposition: A | Discharge status: Home / Self Care | Payer: Medicaid Other | Source: Home / Self Care | Attending: Obstetrics and Gynecology | Admitting: Obstetrics and Gynecology

## 2023-01-22 ENCOUNTER — Encounter (HOSPITAL_COMMUNITY): Payer: Self-pay | Admitting: Obstetrics and Gynecology

## 2023-01-22 ENCOUNTER — Inpatient Hospital Stay (HOSPITAL_COMMUNITY): Payer: Medicaid Other

## 2023-01-22 ENCOUNTER — Ambulatory Visit: Payer: Medicaid Other

## 2023-01-22 DIAGNOSIS — R1030 Lower abdominal pain, unspecified: Secondary | ICD-10-CM | POA: Insufficient documentation

## 2023-01-22 DIAGNOSIS — O26891 Other specified pregnancy related conditions, first trimester: Secondary | ICD-10-CM | POA: Diagnosis present

## 2023-01-22 DIAGNOSIS — Z3480 Encounter for supervision of other normal pregnancy, unspecified trimester: Secondary | ICD-10-CM

## 2023-01-22 DIAGNOSIS — Z3490 Encounter for supervision of normal pregnancy, unspecified, unspecified trimester: Secondary | ICD-10-CM

## 2023-01-22 DIAGNOSIS — Z3A08 8 weeks gestation of pregnancy: Secondary | ICD-10-CM | POA: Diagnosis not present

## 2023-01-22 DIAGNOSIS — O210 Mild hyperemesis gravidarum: Secondary | ICD-10-CM | POA: Diagnosis not present

## 2023-01-22 DIAGNOSIS — R109 Unspecified abdominal pain: Secondary | ICD-10-CM

## 2023-01-22 DIAGNOSIS — M5459 Other low back pain: Secondary | ICD-10-CM | POA: Diagnosis present

## 2023-01-22 LAB — URINALYSIS, ROUTINE W REFLEX MICROSCOPIC
Bilirubin Urine: NEGATIVE
Glucose, UA: NEGATIVE mg/dL
Hgb urine dipstick: NEGATIVE
Ketones, ur: 5 mg/dL — AB
Leukocytes,Ua: NEGATIVE
Nitrite: NEGATIVE
Protein, ur: 30 mg/dL — AB
Specific Gravity, Urine: 1.031 — ABNORMAL HIGH (ref 1.005–1.030)
pH: 7 (ref 5.0–8.0)

## 2023-01-22 LAB — COMPREHENSIVE METABOLIC PANEL
ALT: 14 U/L (ref 0–44)
AST: 19 U/L (ref 15–41)
Albumin: 3.4 g/dL — ABNORMAL LOW (ref 3.5–5.0)
Alkaline Phosphatase: 53 U/L (ref 38–126)
Anion gap: 7 (ref 5–15)
BUN: 7 mg/dL (ref 6–20)
CO2: 24 mmol/L (ref 22–32)
Calcium: 8.9 mg/dL (ref 8.9–10.3)
Chloride: 103 mmol/L (ref 98–111)
Creatinine, Ser: 0.67 mg/dL (ref 0.44–1.00)
GFR, Estimated: >60 mL/min (ref >60)
Glucose, Bld: 80 mg/dL (ref 70–99)
Potassium: 4.8 mmol/L (ref 3.5–5.1)
Sodium: 134 mmol/L — ABNORMAL LOW (ref 135–145)
Total Bilirubin: 0.3 mg/dL (ref 0.3–1.2)
Total Protein: 6.9 g/dL (ref 6.5–8.1)

## 2023-01-22 LAB — WET PREP, GENITAL
Clue Cells Wet Prep HPF POC: NONE SEEN
Sperm: NONE SEEN
Trich, Wet Prep: NONE SEEN
WBC, Wet Prep HPF POC: <10 (ref <10)
Yeast Wet Prep HPF POC: NONE SEEN

## 2023-01-22 LAB — CBC WITH DIFFERENTIAL/PLATELET
Abs Immature Granulocytes: 0.01 10*3/uL (ref 0.00–0.07)
Basophils Absolute: 0 10*3/uL (ref 0.0–0.1)
Basophils Relative: 0 %
Eosinophils Absolute: 0 10*3/uL (ref 0.0–0.5)
Eosinophils Relative: 0 %
HCT: 40.7 % (ref 36.0–46.0)
Hemoglobin: 13.6 g/dL (ref 12.0–15.0)
Immature Granulocytes: 0 %
Lymphocytes Relative: 33 %
Lymphs Abs: 1.9 10*3/uL (ref 0.7–4.0)
MCH: 26.7 pg (ref 26.0–34.0)
MCHC: 33.4 g/dL (ref 30.0–36.0)
MCV: 79.8 fL — ABNORMAL LOW (ref 80.0–100.0)
Monocytes Absolute: 0.9 10*3/uL (ref 0.1–1.0)
Monocytes Relative: 17 %
Neutro Abs: 2.8 10*3/uL (ref 1.7–7.7)
Neutrophils Relative %: 50 %
Platelets: 263 10*3/uL (ref 150–400)
RBC: 5.1 MIL/uL (ref 3.87–5.11)
RDW: 13.4 % (ref 11.5–15.5)
WBC: 5.7 10*3/uL (ref 4.0–10.5)
nRBC: 0 % (ref 0.0–0.2)

## 2023-01-22 LAB — HCG, QUANTITATIVE, PREGNANCY: hCG, Beta Chain, Quant, S: >250000 m[IU]/mL — ABNORMAL HIGH (ref <5)

## 2023-01-22 LAB — ABO/RH: ABO/RH(D): O POS

## 2023-01-22 LAB — HIV ANTIBODY (ROUTINE TESTING W REFLEX): HIV Screen 4th Generation wRfx: NONREACTIVE

## 2023-01-22 MED ORDER — VITAFOL GUMMIES 3.33-0.333-34.8 MG PO CHEW: 3.0000 | CHEWABLE_TABLET | Freq: Every day | ORAL | 11 refills | Status: DC

## 2023-01-22 MED ORDER — PROMETHAZINE HCL 25 MG PO TABS: 25.0000 mg | ORAL_TABLET | Freq: Four times a day (QID) | ORAL | 0 refills | Status: DC | PRN

## 2023-01-22 MED ORDER — PROMETHAZINE HCL 25 MG/ML IJ SOLN
25.0000 mg | Freq: Once | INTRAMUSCULAR | Status: AC
  Administered 2023-01-22: 25 mg via INTRAMUSCULAR
  Filled 2023-01-22: qty 1

## 2023-01-22 NOTE — Discharge Instructions (Signed)
Seek abortion care, adoption services or establish [facility] before 12 wks

## 2023-01-22 NOTE — MAU Provider Note (Signed)
History     CSN: [id number]  Arrival date and time: 01/22/23 1225   Event Date/Time   First Provider Initiated Contact with Patient 01/22/23 1503      Chief Complaint  Patient presents with   Nausea   Emesis   Abdominal Pain   HPI [name] is a 22 y.o. year old G13P1021 female at [redacted]w[redacted]d weeks gestation who presents to MAU reporting abdominal pain for 3 days that radiates to LT lower back. She report N/V x 4 wks. She wants an antiemetic prescribed to take at home. She reports she does not want to continue with this pregnancy, but she cannot afford to terminate the pregnancy at a cost of $600-650. She is afraid her mom (who she currently lives with) will kick her out if she finds out she is pregnant again. She just had a baby 9 months ago. She reports her LMP was 11/22/2022.    OB History     Gravida  4   Para  1   Term  1   Preterm  0   AB  2   Living  1      SAB  2   IAB  0   Ectopic  0   Multiple  0   Live Births  1           Past Medical History:  Diagnosis Date   BV (bacterial vaginosis)    Gonorrhea    Medical history non-contributory    UTI (urinary tract infection)     Past Surgical History:  Procedure Laterality Date   NO PAST SURGERIES      Family History  Problem Relation Age of Onset   Sickle cell anemia Mother    Hypothyroidism Mother    Healthy Father        was killed    Social History   Tobacco Use   Smoking status: Never    Passive exposure: Never   Smokeless tobacco: Never  Vaping Use   Vaping status: Never Used  Substance Use Topics   Alcohol use: No   Drug use: No    Allergies: No Known Allergies  Medications Prior to Admission  Medication Sig Dispense Refill Last Dose   acetaminophen (TYLENOL) 325 MG tablet Take 2 tablets (650 mg total) by mouth every 4 (four) hours as needed (for pain scale < 4). (Patient not taking: Reported on 05/27/2022) 60 tablet 0    ibuprofen (ADVIL) 600 MG tablet Take 1 tablet  (600 mg total) by mouth every 6 (six) hours. (Patient not taking: Reported on 05/27/2022) 30 tablet 0    metroNIDAZOLE (FLAGYL) 500 MG tablet Take 1 tablet (500 mg total) by mouth 2 (two) times daily. 14 tablet 0    Prenatal Vit-Fe Phos-FA-Omega (VITAFOL GUMMIES) 3.33-0.333-34.8 MG CHEW Chew 3 tablets by mouth daily. (Patient not taking: Reported on 05/27/2022) 90 tablet 11     Review of Systems  Constitutional: Negative.   HENT: Negative.    Eyes: Negative.   Respiratory: Negative.    Gastrointestinal:  Positive for nausea and vomiting.  Endocrine: Negative.   Genitourinary: Negative.   Musculoskeletal: Negative.   Skin: Negative.   Allergic/Immunologic: Negative.   Neurological: Negative.   Hematological: Negative.   Psychiatric/Behavioral: Negative.     Physical Exam   Blood pressure 106/70, pulse (!) 102, temperature 98.5 F (36.9 C), temperature source Oral, resp. rate 14, height 5\' 5"  (1.651 m), weight 72.7 kg, last menstrual period 11/22/2022, SpO2  99%, currently breastfeeding.  Physical Exam Vitals and nursing note reviewed.  Constitutional:      Appearance: Normal appearance. She is normal weight.  Cardiovascular:     Rate and Rhythm: Tachycardia present.  Pulmonary:     Effort: Pulmonary effort is normal.  Genitourinary:    Comments: Swabs collected by patinet using blind swab technique  Neurological:     Mental Status: She is alert and oriented to person, place, and time.  Psychiatric:        Mood and Affect: Mood normal.        Behavior: Behavior normal.        Thought Content: Thought content normal.        Judgment: Judgment normal.     MAU Course  Procedures  MDM CCUA UPT CBC ABO/Rh HCG Wet Prep GC/CT -- pending HIV -- pending OB < 14 wks [place] with TV Phenergan 25 mg IM injection -- per pt request  Results for orders placed or performed during the hospital encounter of 01/22/23 (from the past 24 hour(s))  Urinalysis, Routine w reflex  microscopic -Urine, Clean Catch     Status: Abnormal   Collection Time: 01/22/23  1:36 PM  Result Value Ref Range   Color, Urine AMBER (A) YELLOW   APPearance HAZY (A) CLEAR   Specific Gravity, Urine 1.031 (H) 1.005 - 1.030   pH 7.0 5.0 - 8.0   Glucose, UA NEGATIVE NEGATIVE mg/dL   Hgb urine dipstick NEGATIVE NEGATIVE   Bilirubin Urine NEGATIVE NEGATIVE   Ketones, ur 5 (A) NEGATIVE mg/dL   Protein, ur 30 (A) NEGATIVE mg/dL   Nitrite NEGATIVE NEGATIVE   Leukocytes,Ua NEGATIVE NEGATIVE   RBC / HPF 0-5 0 - 5 RBC/hpf   WBC, UA 0-5 0 - 5 WBC/hpf   Bacteria, UA RARE (A) NONE SEEN   Squamous Epithelial / HPF 6-10 0 - 5 /HPF   Mucus PRESENT   ABO/Rh     Status: None   Collection Time: 01/22/23  1:58 PM  Result Value Ref Range   ABO/RH(D) O POS    No rh immune globuloin      NOT A RH IMMUNE GLOBULIN CANDIDATE, PT RH POSITIVE Performed at [facility], [address], [place], [place] [address]   CBC with Differential/Platelet     Status: Abnormal   Collection Time: 01/22/23  2:04 PM  Result Value Ref Range   WBC 5.7 4.0 - 10.5 K/uL   RBC 5.10 3.87 - 5.11 MIL/uL   Hemoglobin 13.6 12.0 - 15.0 g/dL   HCT 60.4 54.0 - 98.1 %   MCV 79.8 (L) 80.0 - 100.0 fL   MCH 26.7 26.0 - 34.0 pg   MCHC 33.4 30.0 - 36.0 g/dL   RDW 19.1 47.8 - 29.5 %   Platelets 263 150 - 400 K/uL   nRBC 0.0 0.0 - 0.2 %   Neutrophils Relative % 50 %   Neutro Abs 2.8 1.7 - 7.7 K/uL   Lymphocytes Relative 33 %   Lymphs Abs 1.9 0.7 - 4.0 K/uL   Monocytes Relative 17 %   Monocytes Absolute 0.9 0.1 - 1.0 K/uL   Eosinophils Relative 0 %   Eosinophils Absolute 0.0 0.0 - 0.5 K/uL   Basophils Relative 0 %   Basophils Absolute 0.0 0.0 - 0.1 K/uL   Immature Granulocytes 0 %   Abs Immature Granulocytes 0.01 0.00 - 0.07 K/uL  Comprehensive metabolic panel     Status: Abnormal   Collection  Time: 01/22/23  2:04 PM  Result Value Ref Range   Sodium 134 (L) 135 - 145 mmol/L   Potassium 4.8 3.5 - 5.1 mmol/L   Chloride  103 98 - 111 mmol/L   CO2 24 22 - 32 mmol/L   Glucose, Bld 80 70 - 99 mg/dL   BUN 7 6 - 20 mg/dL   Creatinine, Ser 0.98 0.44 - 1.00 mg/dL   Calcium 8.9 8.9 - 11.9 mg/dL   Total Protein 6.9 6.5 - 8.1 g/dL   Albumin 3.4 (L) 3.5 - 5.0 g/dL   AST 19 15 - 41 U/L   ALT 14 0 - 44 U/L   Alkaline Phosphatase 53 38 - 126 U/L   Total Bilirubin 0.3 0.3 - 1.2 mg/dL   GFR, Estimated >14 >78 mL/min   Anion gap 7 5 - 15  hCG, quantitative, pregnancy     Status: Abnormal   Collection Time: 01/22/23  2:04 PM  Result Value Ref Range   hCG, Beta Chain, Quant, S >250,000 (H) <5 mIU/mL  HIV Antibody (routine testing w rflx)     Status: None   Collection Time: 01/22/23  2:04 PM  Result Value Ref Range   HIV Screen 4th Generation wRfx Non Reactive Non Reactive  Wet prep, genital     Status: None   Collection Time: 01/22/23  2:05 PM   Specimen: Vaginal  Result Value Ref Range   Yeast Wet Prep HPF POC NONE SEEN NONE SEEN   Trich, Wet Prep NONE SEEN NONE SEEN   Clue Cells Wet Prep HPF POC NONE SEEN NONE SEEN   WBC, Wet Prep HPF POC <10 <10   Sperm NONE SEEN     US OB LESS THAN 14 WEEKS WITH OB TRANSVAGINAL  Result Date: 01/22/2023 CLINICAL DATA:  Abdominal pain during pregnancy. EXAM: OBSTETRIC <14 WK [place] AND TRANSVAGINAL OB US TECHNIQUE: Both transabdominal and transvaginal ultrasound examinations were performed for complete evaluation of the gestation as well as the maternal uterus, adnexal regions, and pelvic cul-de-sac. Transvaginal technique was performed to assess early pregnancy. COMPARISON:  None Available. FINDINGS: Intrauterine gestational sac: Single Yolk sac:  Visualized. Embryo:  Visualized. Cardiac Activity: Visualized. Heart Rate: 181 bpm CRL:  23.5 mm   9  w   1 d                  [place] EDC: 08/26/2023 Subchorionic hemorrhage:  None visualized. Maternal uterus/adnexae: The right ovary appears normal. The left ovary is not visualized. 4.5 x 4.6 x 4.1 cm anechoic umbilical cord cyst noted.  IMPRESSION: 1. Single live intrauterine gestation measuring 9 weeks 1 day by crown-rump length. 2. Single umbilical cord cyst noted. Attention on follow-up scan recommended. Electronically Signed   By: Darliss Cheney M.D.   On: 01/22/2023 16:35     Assessment and Plan  1. Intrauterine pregnancy - Continue taking PNVs - Rx renewed - Seek abortion care, adoption services or establish PNC before 12 wks  2. Abdominal pain during pregnancy in first trimester - Information provided on abdominal pain in pregnancy in 1st trimester   3. Morning sickness - Information provided on morning sickness   4. [redacted] weeks gestation of pregnancy  - Discharge patient - Patient verbalized an understanding of the plan of care and agrees.   Raelyn Mora, CNM 01/22/2023, 3:03 PM

## 2023-01-22 NOTE — MAU Note (Addendum)
.  Denise Tanner is a 22 y.o. at [redacted]w[redacted]d here in MAU reporting: Intermittent lower abdominal pain as well as nausea/vomiting. She reports the abdominal pain began three days ago and reports she feels the pain around 2-3 times a day and at times will radiated to her left lower back. She reports the N/V began four weeks ago. She reports she is able to eat but will vomit a couple of hours later. Desires something for nausea at home. Denies current nausea. Denies VB. Denies vaginal itching, vaginal odors, and vaginal discharge. Denies urinary sx's.  Desires an abortion but reports she cannot afford this. Reports she is not ready. Patient fearful.   LMP: 11/22/2022 Onset of complaint:  Pain score: Denies current pain.  Lab orders placed from triage: UA

## 2023-01-25 LAB — GC/CHLAMYDIA PROBE AMP (~~LOC~~) NOT AT ARMC
Chlamydia: NEGATIVE
Comment: NEGATIVE
Comment: NORMAL
Neisseria Gonorrhea: NEGATIVE

## 2023-03-09 ENCOUNTER — Ambulatory Visit: Payer: Medicaid Other | Admitting: *Deleted

## 2023-03-09 DIAGNOSIS — Z3480 Encounter for supervision of other normal pregnancy, unspecified trimester: Secondary | ICD-10-CM | POA: Insufficient documentation

## 2023-03-09 DIAGNOSIS — Z3A15 15 weeks gestation of pregnancy: Secondary | ICD-10-CM

## 2023-03-09 DIAGNOSIS — Z3482 Encounter for supervision of other normal pregnancy, second trimester: Secondary | ICD-10-CM

## 2023-03-09 MED ORDER — BLOOD PRESSURE KIT DEVI
1.0000 | 0 refills | Status: DC
Start: 2023-03-09 — End: 2023-08-23

## 2023-03-09 MED ORDER — VITAFOL GUMMIES 3.33-0.333-34.8 MG PO CHEW: 3.0000 | CHEWABLE_TABLET | Freq: Every day | ORAL | 11 refills | Status: AC

## 2023-03-09 NOTE — Progress Notes (Cosign Needed Addendum)
New OB Intake  I connected with Jeanann Lewandowsky Esson  on 03/09/23 at  3:10 PM EDT by Telephone Visit and verified that I am speaking with the correct person using two identifiers. Nurse is located at [facility] and pt is located at Home.  I discussed the limitations, risks, security and privacy concerns of performing an evaluation and management service by telephone and the availability of in person appointments. I also discussed with the patient that there may be a patient responsible charge related to this service. The patient expressed understanding and agreed to proceed.  I explained I am completing New OB Intake today. We discussed EDD of 08/29/2023, by Last Menstrual Period. Pt is G4P1021. I reviewed her allergies, medications and Medical/Surgical/OB history.    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 Discussed the nature of our practice with multiple providers including residents and students. Due to the size of the practice, the delivering provider may not be the same as those providing prenatal care.   Patient is not interested in water birth. Offered upcoming OB visit with CNM to discuss further.  MyChart/Babyscripts MyChart access verified. I explained pt will have some visits in office and some virtually. Babyscripts instructions given and order placed. Patient verifies receipt of registration text/e-mail. Account successfully created and app downloaded.  Blood Pressure Cuff/Weight Scale Blood pressure cuff ordered for patient to pick-up from [employer]. Explained after first prenatal appt pt will check weekly and document in Babyscripts. Patient does not have weight scale; patient may purchase if they desire  to track weight weekly in Babyscripts.  Anatomy US Explained first scheduled [place] will be around 19 weeks. Anatomy US scheduled for 04/08/23 at 12:15pm.  Interested in [place]? If yes, send referral and doula dot phrase.   Is patient a candidate for Babyscripts Optimization? Yes  First visit review I reviewed new OB appt with patient. Explained pt will be seen by Raelyn Mora, CNM at first visit. Discussed Avelina Laine genetic screening with patient. Requests Panorama and Horizon.. Routine prenatal labs  not collected, virtual visit. Lab only visit for OB Panel, OB Urine, Natera, AFP scheduled for 03/10/23. Orders for anatomy scan placed and scan scheduled 04/08/23.    Last Pap Diagnosis  Date Value Ref Range Status  07/23/2021   Final   - Negative for intraepithelial lesion or malignancy (NILM)    Harrel Lemon, RN 03/09/2023  3:53 PM

## 2023-03-09 NOTE — Patient Instructions (Signed)
[facility] [employer] has a partnership with the [employer] to provide prenatal navigation for the most needed resources in our community. In order to see how we can help connect you to these resources we need consent to contact you. Please complete the very short consent using the link below:   English Link: https://[email or web address].[email or web address]?[email or web address]  Spanish Link: https://[email or web address].tfaforms.[email or web address]?[email or web address]  Options for Doula Care in the [place]  As you review your birthing options, consider having a birth doula. A doula is trained to provide support before, during and just after you give birth. There are also postpartum doulas that help you adjust to new parenthood.  While doulas do not provide medical care, they do provide emotional, physical and educational support. A few months before your baby arrives, doulas can help answer questions, ease concerns and help you create and support your birthing plan.    Doulas can help reduce your stress and comfort you and your partner. They can help you cope with labor by helping you use breathing techniques, massage, creative labor positioning, essential oils and affirmations.   Studies show that the benefits of having a doula include:   A more positive birth experience  Fewer requests for pain-relief medication  Less likelihood of cesarean section, commonly called a c-section   Doulas are typically hired via a Ad[employer] account planner between you and the doula. We are happy to provide a list of the most active doulas in the area, all of whom are credentialed by [facility] and will not count as a visitor at your birth.  There are several options for no-cost doula care at our hospital, including:  [facility] Volunteer Doula Program Every [employer] Program A Cure 4 Moms Doula Study (available only at [employer] for [facility], [place], [place] and [facility] offices)  For more information on these programs or to receive  a list of doulas active in our area, please email [email or web address]  Our practice his participating in a study that provides no-cost doula care. ACURE4Moms is a study looking at how doula care can reduce birthing disparities for Black and brown birthing people. We like to refer patients as soon as possible, but definitely before 28 weeks so patients can get to know their doula.    A doula is trained to provide support before, during and just after you give birth. While doulas do not provide medical care, they do provide emotional, physical and educational support. Doulas can help reduce your stress and comfort you and your partner. They can help you cope with labor by helping you use breathing techniques, massage, creative labor positioning, essential oils and affirmations.   ACURE4Moms is a research study trying to reduce:   low birthweight babies  emergency department visits & hospitalizations for birthing persons and their babies  depression among birthing people  discrimination in pregnancy-related care ACURE4Moms is trying out 2 programs designed by  people who have given birth. These programs include: 1. Sharing patient data and warning alerts with clinic staff to keep them accountable for their patients' outcomes and providing tools to help them  reduce bias in care. 2. Matching eligible patients with doulas from the  same community as the patients.  If you would like to participate in this study, please visit:   [email or web address]-castaneda.info/

## 2023-03-10 ENCOUNTER — Other Ambulatory Visit: Payer: Medicaid Other

## 2023-03-10 DIAGNOSIS — Z3480 Encounter for supervision of other normal pregnancy, unspecified trimester: Secondary | ICD-10-CM

## 2023-03-10 DIAGNOSIS — Z3A15 15 weeks gestation of pregnancy: Secondary | ICD-10-CM

## 2023-03-10 DIAGNOSIS — Z3482 Encounter for supervision of other normal pregnancy, second trimester: Secondary | ICD-10-CM

## 2023-03-11 LAB — CBC/D/PLT+RPR+RH+ABO+RUBIGG...
Antibody Screen: NEGATIVE
Basophils Absolute: 0 10*3/uL (ref 0.0–0.2)
Basos: 0 %
EOS (ABSOLUTE): 0.1 10*3/uL (ref 0.0–0.4)
Eos: 1 %
HCV Ab: NONREACTIVE
HIV Screen 4th Generation wRfx: NONREACTIVE
Hematocrit: 38.1 % (ref 34.0–46.6)
Hemoglobin: 12.2 g/dL (ref 11.1–15.9)
Hepatitis B Surface Ag: NEGATIVE
Immature Grans (Abs): 0.1 10*3/uL (ref 0.0–0.1)
Immature Granulocytes: 1 %
Lymphocytes Absolute: 2 10*3/uL (ref 0.7–3.1)
Lymphs: 26 %
MCH: 26.6 pg (ref 26.6–33.0)
MCHC: 32 g/dL (ref 31.5–35.7)
MCV: 83 fL (ref 79–97)
Monocytes Absolute: 0.6 10*3/uL (ref 0.1–0.9)
Monocytes: 8 %
Neutrophils Absolute: 4.8 10*3/uL (ref 1.4–7.0)
Neutrophils: 64 %
Platelets: 273 10*3/uL (ref 150–450)
RBC: 4.59 x10E6/uL (ref 3.77–5.28)
RDW: 14.4 % (ref 11.7–15.4)
RPR Ser Ql: NONREACTIVE
Rh Factor: POSITIVE
Rubella Antibodies, IGG: 2.31 {index} (ref >0.99)
WBC: 7.5 10*3/uL (ref 3.4–10.8)

## 2023-03-11 LAB — HCV INTERPRETATION

## 2023-03-12 LAB — AFP, SERUM, OPEN SPINA BIFIDA
AFP MoM: 0.79
AFP Value: 25.5 ng/mL
Gest. Age on Collection Date: 15.4 wk
Maternal Age At EDD: 23.4 a
OSBR Risk 1 IN: 10000
Test Results:: NEGATIVE
Weight: 158 [lb_av]

## 2023-03-16 ENCOUNTER — Encounter: Payer: Self-pay | Admitting: Obstetrics and Gynecology

## 2023-03-16 LAB — PANORAMA PRENATAL TEST FULL PANEL:PANORAMA TEST PLUS 5 ADDITIONAL MICRODELETIONS: FETAL FRACTION: 7.9

## 2023-03-24 ENCOUNTER — Ambulatory Visit (INDEPENDENT_AMBULATORY_CARE_PROVIDER_SITE_OTHER): Payer: Medicaid Other

## 2023-03-24 VITALS — BP 115/72 | HR 89

## 2023-03-24 DIAGNOSIS — R309 Painful micturition, unspecified: Secondary | ICD-10-CM | POA: Diagnosis not present

## 2023-03-24 LAB — POCT URINALYSIS DIPSTICK
Bilirubin, UA: NEGATIVE
Blood, UA: NEGATIVE
Glucose, UA: NEGATIVE
Ketones, UA: POSITIVE
Nitrite, UA: NEGATIVE
Odor: NEGATIVE
Protein, UA: NEGATIVE
Spec Grav, UA: 1.025 (ref 1.010–1.025)
Urobilinogen, UA: 0.2 U/dL
pH, UA: 6 (ref 5.0–8.0)

## 2023-03-24 NOTE — Progress Notes (Signed)
..  SUBJECTIVE: Denise Tanner is a 22 y.o. pregnant female who complains of intermittent dysuria/cramping x 7 days, without flank pain, fever, chills, or abnormal vaginal discharge or bleeding.   OBJECTIVE: Appears well, in no apparent distress.  Vital signs are normal. Urine dipstick shows positive for leukocytes and positive for ketones.    ASSESSMENT: Dysuria  PLAN: Treatment per orders.  Call or return to clinic prn if these symptoms worsen or fail to improve as anticipated.

## 2023-03-26 LAB — URINE CULTURE, OB REFLEX

## 2023-03-26 LAB — CULTURE, OB URINE

## 2023-04-07 ENCOUNTER — Telehealth: Payer: Self-pay | Admitting: Clinical

## 2023-04-07 NOTE — Telephone Encounter (Signed)
Attempt call regarding referral; Left HIPPA-compliant message to call back Mikle Sternberg from [facility] at  [phone] (Ashelyn Mccravy's office).    

## 2023-04-08 ENCOUNTER — Other Ambulatory Visit: Payer: Self-pay | Admitting: *Deleted

## 2023-04-08 ENCOUNTER — Other Ambulatory Visit: Payer: Self-pay

## 2023-04-08 ENCOUNTER — Ambulatory Visit: Payer: Medicaid Other | Attending: Obstetrics and Gynecology

## 2023-04-08 ENCOUNTER — Ambulatory Visit: Payer: Medicaid Other | Admitting: *Deleted

## 2023-04-08 VITALS — BP 107/65 | HR 88

## 2023-04-08 DIAGNOSIS — Z3480 Encounter for supervision of other normal pregnancy, unspecified trimester: Secondary | ICD-10-CM | POA: Insufficient documentation

## 2023-04-08 DIAGNOSIS — O283 Abnormal ultrasonic finding on antenatal screening of mother: Secondary | ICD-10-CM | POA: Insufficient documentation

## 2023-04-08 DIAGNOSIS — Z362 Encounter for other antenatal screening follow-up: Secondary | ICD-10-CM

## 2023-04-08 DIAGNOSIS — Z363 Encounter for antenatal screening for malformations: Secondary | ICD-10-CM | POA: Insufficient documentation

## 2023-04-08 DIAGNOSIS — O321XX0 Maternal care for breech presentation, not applicable or unspecified: Secondary | ICD-10-CM | POA: Diagnosis not present

## 2023-04-08 DIAGNOSIS — O09899 Supervision of other high risk pregnancies, unspecified trimester: Secondary | ICD-10-CM

## 2023-04-08 DIAGNOSIS — Z3A20 20 weeks gestation of pregnancy: Secondary | ICD-10-CM | POA: Diagnosis not present

## 2023-04-15 ENCOUNTER — Encounter: Payer: Medicaid Other | Admitting: Obstetrics and Gynecology

## 2023-04-15 DIAGNOSIS — Z3480 Encounter for supervision of other normal pregnancy, unspecified trimester: Secondary | ICD-10-CM

## 2023-04-22 ENCOUNTER — Encounter: Payer: Self-pay | Admitting: *Deleted

## 2023-04-22 DIAGNOSIS — O09899 Supervision of other high risk pregnancies, unspecified trimester: Secondary | ICD-10-CM | POA: Insufficient documentation

## 2023-04-26 NOTE — BH Specialist Note (Unsigned)
Integrated Behavioral Health via Telemedicine Visit  05/11/2023 Denise Tanner [id number]  Number of Integrated Behavioral Health Clinician visits: 1- Initial Visit  Session Start time: 1520   Session End time: 1557  Total time in minutes: 37   Referring Provider: Harvie Bridge, MD Patient/Family location: [facility] Provider location: [facility]  All persons participating in visit: Patient Denise Tanner and [facility] Tamura   Types of Service: Individual psychotherapy and Video visit  I connected with Jeanann Lewandowsky Fletchall and/or Jeanann Lewandowsky Nix's  n/a  via  Telephone or Engineer, civil (consulting)  (Video is Surveyor, mining) and verified that I am speaking with the correct person using two identifiers. Discussed confidentiality: Yes   I discussed the limitations of telemedicine and the availability of in person appointments.  Discussed there is a possibility of technology failure and discussed alternative modes of communication if that failure occurs.  I discussed that engaging in this telemedicine visit, they consent to the provision of behavioral healthcare and the services will be billed under their insurance.  Patient and/or legal guardian expressed understanding and consented to Telemedicine visit: Yes   Presenting Concerns: Patient and/or family reports the following symptoms/concerns: Anxiety increase in unexpected pregnancy; worry regarding "extra fat under one arm" experienced last pregnancy that was "swollen with milk and hard as a rock"; area is now sore and starting to get hard again; noticing getting out of breath fast. Pt worries if she breastfeeds this time, baby may get too clingy. Pt also concerned about eating plain cornstarch daily, as also experienced cornstarch cravings with last pregnancy.  Duration of problem: Current pregnancy; Severity of problem: moderate  Patient and/or Family's  Strengths/Protective Factors: Social connections, Concrete supports in place (healthy food, safe environments, etc.), and Sense of purpose  Goals Addressed: Patient will:  Reduce symptoms of: anxiety   Increase knowledge and/or ability of: healthy habits   Demonstrate ability to: Increase healthy adjustment to current life circumstances  Progress towards Goals: Ongoing  Interventions: Interventions utilized:  Motivational Interviewing and Psychoeducation and/or Health Education Standardized Assessments completed: Not Needed  Patient and/or Family Response: Patient agrees with treatment plan.   Assessment: Patient currently experiencing  Adjustment disorder with anxiety.   Patient may benefit from psychoeducation and brief therapeutic interventions regarding coping with symptoms of anxiety .  Plan: Follow up with behavioral health clinician on : One month Behavioral recommendations:  -Continue taking prenatal vitamin daily -Discuss medical concerns at upcoming medical appointment (cornstarch cravings, underarm pain) -Begin prioritizing healthy self-care (regular meals, adequate rest; allowing practical help from supportive friends and family) -Read through information on After Visit Summary; use as needed -Read through Postpartum planner on After Visit Summary; use as needed Referral(s): Integrated [employer] (In Clinic)  I discussed the assessment and treatment plan with the patient and/or parent/guardian. They were provided an opportunity to ask questions and all were answered. They agreed with the plan and demonstrated an understanding of the instructions.   They were advised to call back or seek an in-person evaluation if the symptoms worsen or if the condition fails to improve as anticipated.  Rae Lips, LCSW     03/09/2023    3:34 PM 09/22/2021    2:09 PM  Depression screen PHQ 2/9  Decreased Interest 1 0  Down, Depressed, Hopeless 1 0  PHQ -  2 Score 2 0  Altered sleeping 0 0  Tired, decreased energy 1 2  Change in appetite 0 0  Feeling bad or failure about yourself  1 0  Trouble concentrating 0 0  Moving slowly or fidgety/restless 0 0  Suicidal thoughts 0 0  PHQ-9 Score 4 2  Difficult doing work/chores  Not difficult at all      03/09/2023    3:35 PM 09/22/2021    2:10 PM  GAD 7 : Generalized Anxiety Score  Nervous, Anxious, on Edge 0 0  Control/stop worrying 2 0  Worry too much - different things 2 0  Trouble relaxing 1 0  Restless 0 0  Easily annoyed or irritable 0 0  Afraid - awful might happen 0 0  Total GAD 7 Score 5 0  Anxiety Difficulty  Not difficult at all

## 2023-05-04 ENCOUNTER — Ambulatory Visit: Payer: Medicaid Other

## 2023-05-04 DIAGNOSIS — Z3480 Encounter for supervision of other normal pregnancy, unspecified trimester: Secondary | ICD-10-CM

## 2023-05-05 ENCOUNTER — Ambulatory Visit: Payer: Medicaid Other | Admitting: Obstetrics

## 2023-05-05 ENCOUNTER — Encounter: Payer: Medicaid Other | Admitting: Obstetrics and Gynecology

## 2023-05-05 ENCOUNTER — Encounter: Payer: Self-pay | Admitting: Obstetrics

## 2023-05-05 ENCOUNTER — Encounter: Payer: Medicaid Other | Admitting: Obstetrics

## 2023-05-05 VITALS — BP 104/59 | HR 81 | Wt 166.2 lb

## 2023-05-05 DIAGNOSIS — Z3A23 23 weeks gestation of pregnancy: Secondary | ICD-10-CM

## 2023-05-05 DIAGNOSIS — Z3480 Encounter for supervision of other normal pregnancy, unspecified trimester: Secondary | ICD-10-CM

## 2023-05-05 DIAGNOSIS — Z3482 Encounter for supervision of other normal pregnancy, second trimester: Secondary | ICD-10-CM

## 2023-05-05 NOTE — Progress Notes (Signed)
Subjective:    Denise Tanner is being seen today for her first obstetrical visit.  This is not a planned pregnancy. She is at [redacted]w[redacted]d gestation. Her obstetrical history is significant for  none . Relationship with FOB: significant other, not living together. Patient does intend to breast feed. Pregnancy history fully reviewed.  The information documented in the HPI was reviewed and verified.  Menstrual History: OB History     Gravida  4   Para  1   Term  1   Preterm  0   AB  2   Living  1      SAB  2   IAB  0   Ectopic  0   Multiple  0   Live Births  1           Patient's last menstrual period was 11/22/2022 (exact date).    Past Medical History:  Diagnosis Date   BV (bacterial vaginosis)    Gonorrhea    Medical history non-contributory    UTI (urinary tract infection)     Past Surgical History:  Procedure Laterality Date   NO PAST SURGERIES     WISDOM TOOTH EXTRACTION      (Not in a hospital admission)  No Known Allergies  Social History   Tobacco Use   Smoking status: Never    Passive exposure: Never   Smokeless tobacco: Never  Substance Use Topics   Alcohol use: No    Family History  Problem Relation Age of Onset   Cancer Mother    Sickle cell anemia Mother    Hypothyroidism Mother    Breast cancer Mother    Healthy Father        was killed   Asthma Brother    Hypertension Maternal Grandmother    Hypothyroidism Maternal Grandmother    Diabetes Paternal Grandmother    Heart disease Neg Hx      Review of Systems Constitutional: negative for weight loss Gastrointestinal: negative for vomiting Genitourinary:negative for genital lesions and vaginal discharge and dysuria Musculoskeletal:negative for back pain Behavioral/Psych: negative for abusive relationship, depression, illegal drug usage and tobacco use    Objective:    BP (!) 104/59   Pulse 81   Wt 166 lb 3.2 oz (75.4 kg)   LMP 11/22/2022 (Exact Date)   BMI 27.66 kg/m   General Appearance:    Alert, cooperative, no distress, appears stated age  Head:    Normocephalic, without obvious abnormality, atraumatic  Eyes:    PERRL, conjunctiva/corneas clear, EOM's intact, fundi    benign, both eyes  Ears:    Normal TM's and external ear canals, both ears  Nose:   Nares normal, septum midline, mucosa normal, no drainage    or sinus tenderness  Throat:   Lips, mucosa, and tongue normal; teeth and gums normal  Neck:   Supple, symmetrical, trachea midline, no adenopathy;    thyroid:  no enlargement/tenderness/nodules; no carotid   bruit or JVD  Back:     Symmetric, no curvature, ROM normal, no CVA tenderness  Lungs:     Clear to auscultation bilaterally, respirations unlabored  Chest Wall:    No tenderness or deformity   Heart:    Regular rate and rhythm, S1 and S2 normal, no murmur, rub   or gallop  Breast Exam:    No tenderness, masses, or nipple abnormality  Abdomen:     Soft, non-tender, bowel sounds active all four quadrants,    no masses,  no organomegaly  Genitalia:    Normal female without lesion, discharge or tenderness  Extremities:   Extremities normal, atraumatic, no cyanosis or edema  Pulses:   2+ and symmetric all extremities  Skin:   Skin color, texture, turgor normal, no rashes or lesions  Lymph nodes:   Cervical, supraclavicular, and axillary nodes normal  Neurologic:   CNII-XII intact, normal strength, sensation and reflexes    throughout      Lab Review Urine pregnancy test Labs reviewed yes Radiologic studies reviewed yes  Assessment:    Pregnancy at [redacted]w[redacted]d weeks    Plan:    1. Supervision of other normal pregnancy, antepartum  Prenatal vitamins.  Counseling provided regarding continued use of seat belts, cessation of alcohol consumption, smoking or use of illicit drugs; infection precautions i.e., influenza/TDAP immunizations, toxoplasmosis,CMV, parvovirus, listeria and varicella; workplace safety, exercise during pregnancy; routine  dental care, safe medications, sexual activity, hot tubs, saunas, pools, travel, caffeine use, fish and methlymercury, potential toxins, hair treatments, varicose veins Weight gain recommendations per IOM guidelines reviewed: underweight/BMI< 18.5--> gain 28 - 40 lbs; normal weight/BMI 18.5 - 24.9--> gain 25 - 35 lbs; overweight/BMI 25 - 29.9--> gain 15 - 25 lbs; obese/BMI >30->gain  11 - 20 lbs Problem list reviewed and updated. FIRST/CF mutation testing/NIPT/QUAD SCREEN/fragile X/Ashkenazi Jewish population testing/Spinal muscular atrophy discussed: requested. Role of ultrasound in pregnancy discussed; fetal survey: requested. Amniocentesis discussed: not indicated.  Follow up in 4 weeks.  I have spent a total of 20 minutes of face-to-face and non-face-to-face time, excluding clinical staff time, reviewing notes and preparing to see patient, ordering tests and/or medications, and counseling the patient.   Brock Bad, MD, FACOG Attending Obstetrician & Gynecologist, [facility] for [facility], [facility], [place] 05/05/2023

## 2023-05-05 NOTE — Progress Notes (Signed)
NOB in office, intake completed on 03/09/23 and u/s completed on 04/08/23. Pt stated that she does not live with FOB, unplanned pregnancy. Pt reports fetal movement with breast tenderness and occasional pressure.

## 2023-05-10 ENCOUNTER — Ambulatory Visit: Payer: Medicaid Other | Admitting: Clinical

## 2023-05-10 DIAGNOSIS — F4322 Adjustment disorder with anxiety: Secondary | ICD-10-CM | POA: Diagnosis not present

## 2023-05-10 DIAGNOSIS — F5083 Pica in adults: Secondary | ICD-10-CM

## 2023-05-10 DIAGNOSIS — Z658 Other specified problems related to psychosocial circumstances: Secondary | ICD-10-CM

## 2023-05-11 NOTE — Patient Instructions (Signed)
Center for [facility] at [facility] [address] [place], [place] [address] [phone] (main office) [phone] (Ory Elting's office)  LIEAP (Low Income Energy Assistance Program) LowBlog.nl  [employer] (Low Income Public house manager Program) LittleDVDs.dk     BRAINSTORMING  Develop a Plan Goals: Provide a way to start conversation about your new life with a baby Assist parents in recognizing and using resources within their reach Help pave the way before birth for an easier period of transition afterwards.  Make a list of the following information to keep in a central location: Full name of Mom and Partner: _____________________________________________ Baby's full name and Date of Birth: ___________________________________________ Home Address: ___________________________________________________________ ________________________________________________________________________ Home Phone: ____________________________________________________________ Parents' cell numbers: _____________________________________________________ ________________________________________________________________________ Name and contact info for OB: ______________________________________________ Name and contact info for Pediatrician:________________________________________ Contact info for Lactation Consultants: ________________________________________  REST and SLEEP *You each need at least 4-5 hours of uninterrupted sleep every day. Write specific names and contact information.* How are you going to rest in the postpartum period? While partner's home? When partner returns to work? When you both return to work? Where will your baby sleep? Who is available to help during the day?  Evening? Night? Who could move in for a period to help support you? What are some ideas to help you get enough sleep? __________________________________________________________________________________________________________________________________________________________________________________________________________________________________________ NUTRITIOUS FOOD AND DRINK *Plan for meals before your baby is born so you can have healthy food to eat during the immediate postpartum period.* Who will look after breakfast? Lunch? Dinner? List names and contact information. Brainstorm quick, healthy ideas for each meal. What can you do before baby is born to prepare meals for the postpartum period? How can others help you with meals? Which grocery stores provide online shopping and delivery? Which restaurants offer take-out or delivery options? ______________________________________________________________________________________________________________________________________________________________________________________________________________________________________________________________________________________________________________________________________________________________________________________________________  CARE FOR MOM *It's important that mom is cared for and pampered in the postpartum period. Remember, the most important ways new mothers need care are: sleep, nutrition, gentle exercise, and time off.* Who can come take care of mom during this period? Make a list of people with their contact information. List some activities that make you feel cared for, rested, and energized? Who can make sure you have opportunities to do these things? Does mom have a space of her very own within your home that's just for her? Make a "[facility]" where she can be comfortable, rest, and renew herself  daily. ______________________________________________________________________________________________________________________________________________________________________________________________________________________________________________________________________________________________________________________________________________________________________________________________________    CARE FOR AND FEEDING BABY *Knowledgeable and encouraging people will offer the best support with regard to feeding your baby.* Educate yourself and choose the best feeding option for your baby. Make a list of people who will guide, support, and be a resource for you as your care for and feed your baby. (Friends that have breastfed or are currently breastfeeding, lactation consultants, breastfeeding support groups, etc.) Consider a postpartum doula. (These websites can give you information: dona.org & https://shea.org/) Seek out local breastfeeding resources like the breastfeeding support group at [employer] or [employer]. ______________________________________________________________________________________________________________________________________________________________________________________________________________________________________________________________________________________________________________________________________________________________________________________________________  Judson Roch AND ERRANDS Who can help with a thorough cleaning before baby is born? Make a list of people who will help with housekeeping and chores, like laundry, light cleaning, dishes, bathrooms, etc. Who can run some errands for you? What can you do to make sure you are stocked with basic supplies before baby is born? Who is going to do the  shopping? ______________________________________________________________________________________________________________________________________________________________________________________________________________________________________________________________________________________________________________________________________________________________________________________________________     Family Adjustment *Nurture yourselves.it helps parents be more loving  and allows for better bonding with their child.* What sorts of things do you and partner enjoy doing together? Which activities help you to connect and strengthen your relationship? Make a list of those things. Make a list of people whom you trust to care for your baby so you can have some time together as a couple. What types of things help partner feel connected to Mom? Make a list. What needs will partner have in order to bond with baby? Other children? Who will care for them when you go into labor and while you are in the hospital? Think about what the needs of your older children might be. Who can help you meet those needs? In what ways are you helping them prepare for bringing baby home? List some specific strategies you have for family adjustment. _______________________________________________________________________________________________________________________________________________________________________________________________________________________________________________________________________________________________________________________________________________  SUPPORT *Someone who can empathize with experiences normalizes your problems and makes them more bearable.* Make a list of other friends, neighbors, and/or co-workers you know with infants (and small children, if applicable) with whom you can connect. Make a list of local or online support groups, mom groups, etc. in which you can be  involved. ______________________________________________________________________________________________________________________________________________________________________________________________________________________________________________________________________________________________________________________________________________________________________________________________________  Childcare Plans Investigate and plan for childcare if mom is returning to work. Talk about mom's concerns about her transition back to work. Talk about partner's concerns regarding this transition.  Mental Health *Your mental health is one of the highest priorities for a pregnant or postpartum mom.* 1 in 5 women experience anxiety and/or depression from the time of conception through the first year after birth. Postpartum Mood Disorders are the #1 complication of pregnancy and childbirth and the suffering experienced by these mothers is not necessary! These illnesses are temporary and respond well to treatment, which often includes self-care, social support, talk therapy, and medication when needed. Women experiencing anxiety and depression often say things like: "I'm supposed to be happy.why do I feel so sad?", "Why can't I snap out of it?", "I'm having thoughts that scare me." There is no need to be embarrassed if you are feeling these symptoms: Overwhelmed, anxious, angry, sad, guilty, irritable, hopeless, exhausted but can't sleep You are NOT alone. You are NOT to blame. With help, you WILL be well. Where can I find help? Medical professionals such as your OB, midwife, gynecologist, family practitioner, primary care provider, pediatrician, or mental health providers; [facility] support groups: Feelings After Birth, Breastfeeding Support Group, Baby and Me Group, and Fit 4 Two exercise classes. You have permission to ask for help. It will confirm your feelings, validate your experiences,  share/learn coping strategies, and gain support and encouragement as you heal. You are important! BRAINSTORM Make a list of local resources, including resources for mom and for partner. Identify support groups. Identify people to call late at night - include names and contact info. Talk with partner about perinatal mood and anxiety disorders. Talk with your OB, midwife, and doula about baby blues and about perinatal mood and anxiety disorders. Talk with your pediatrician about perinatal mood and anxiety disorders.   Support & Sanity Savers   What do you really need?  Basics In preparing for a new baby, many expectant parents spend hours shopping for baby clothes, decorating the nursery, and deciding which car seat to buy. Yet most don't think much about what the reality of parenting a newborn will be like, and what they need to make it through that. So, here is the advice of experienced parents. We know you'll read this, and think "  they're exaggerating, I don't really need that." Just trust [place] on these, OK? Plan for all of this, and if it turns out you don't need it, come back and teach [place] how you did it!  Must-Haves (Once baby's survival needs are met, make sure you attend to your own survival needs!) Sleep An average newborn sleeps 16-18 hours per day, over 6-7 sleep periods, rarely more than three hours at a time. It is normal and healthy for a newborn to wake throughout the night... but really hard on parents!! Naps. Prioritize sleep above any responsibilities like: cleaning house, visiting friends, running errands, etc.  Sleep whenever baby sleeps. If you can't nap, at least have restful times when baby eats. The more rest you get, the more patient you will be, the more emotionally stable, and better at solving problems.  Food You may not have realized it would be difficult to eat when you have a newborn. Yet, when we talk to countless new parents, they say things like "it may be 2:00 pm  when I realize I haven't had breakfast yet." Or "every time we sit down to dinner, baby needs to eat, and my food gets cold, so I don't bother to eat it." Finger food. Before your baby is born, stock up with one months' worth of food that: 1) you can eat with one hand while holding a baby, 2) doesn't need to be prepped, 3) is good hot or cold, 4) doesn't spoil when left out for a few hours, and 5) you like to eat. Think about: nuts, dried fruit, Clif bars, pretzels, jerky, gogurt, baby carrots, apples, bananas, crackers, cheez-n-crackers, string cheese, hot pockets or frozen burritos to microwave, garden burgers and breakfast pastries to put in the toaster, yogurt drinks, etc. Restaurant Menus. Make lists of your favorite restaurants & menu items. When family/friends want to help, you can give specific information without much thought. They can either bring you the food or send gift cards for just the right meals. Freezer Meals.  Take some time to make a few meals to put in the freezer ahead of time.  Easy to freeze meals can be anything such as soup, lasagna, chicken pie, or spaghetti sauce. Set up a Meal Schedule.  Ask friends and family to sign up to bring you meals during the first few weeks of being home. (It can be passed around at baby showers!) You have no idea how helpful this will be until you are in the throes of parenting.  MachineLive.it is a great website to check out. Emotional Support Know who to call when you're stressed out. Parenting a newborn is very challenging work. There are times when it totally overwhelms your normal coping abilities. EVERY NEW PARENT NEEDS TO HAVE A PLAN FOR WHO TO CALL WHEN THEY JUST CAN'T COPE ANY MORE. (And it has to be someone other than the baby's other parent!) Before your baby is born, come up with at least one person you can call for support - write their phone number down and post it on the refrigerator. Anxiety & Sadness. Baby blues are normal after  pregnancy; however, there are more severe types of anxiety & sadness which can occur and should not be ignored.  They are always treatable, but you have to take the first step by reaching out for help. [facility] offers a "Mom Talk" group which meets every Tuesday from 10 am - 11 am.  This group is for new moms who need support  and connection after their babies are born.  Call [phone].  Really, Really Helpful (Plan for them! Make sure these happen often!!) Physical Support with Taking Care of Yourselves Asking friends and family. Before your baby is born, set up a schedule of people who can come and visit and help out (or ask a friend to schedule for you). Any time someone says "let me know what I can do to help," sign them up for a day. When they get there, their job is not to take care of the baby (that's your job and your joy). Their job is to take care of you!  Postpartum doulas. If you don't have anyone you can call on for support, look into postpartum doulas:  professionals at helping parents with caring for baby, caring for themselves, getting breastfeeding started, and helping with household tasks. www.padanc.org is a helpful website for learning about doulas in our area. Peer Support / Parent Groups Why: One of the greatest ideas for new parents is to be around other new parents. Parent groups give you a chance to share and listen to others who are going through the same season of life, get a sense of what is normal infant development by watching several babies learn and grow, share your stories of triumph and struggles with empathetic ears, and forgive your own mistakes when you realize all parents are learning by trial and error. Where to find: There are many places you can meet other new parents throughout our community.  [facility] offers the following classes for new moms and their little ones:  Baby and Me (Birth to Crawling) and Breastfeeding Support Group. Go to  www.conehealthybaby.com or call [phone] for more information. Time for your Relationship It's easy to get so caught up in meeting baby's immediate needs that it's hard to find time to connect with your partner, and meet the needs of your relationship. It's also easy to forget what "quality time with your partner" actually looks like. If you take your baby on a date, you'd be amazed how much of your couple time is spent feeding the baby, diapering the baby, admiring the baby, and talking about the baby. Dating: Try to take time for just the two of you. Babysitter tip: Sometimes when moms are breastfeeding a newborn, they find it hard to figure out how to schedule outings around baby's unpredictable feeding schedules. Have the babysitter come for a three hour period. When she comes over, if baby has just eaten, you can leave right away, and come back in two hours. If baby hasn't fed recently, you start the date at home. Once baby gets hungry and gets a good feeding in, you can head out for the rest of your date time. Date Nights at Home: If you can't get out, at least set aside one evening a week to prioritize your relationship: whenever baby dozes off or doesn't have any immediate needs, spend a little time focusing on each other. Potential conflicts: The main relationship conflicts that come up for new parents are: issues related to sexuality, financial stresses, a feeling of an unfair division of household tasks, and conflicts in parenting styles. The more you can work on these issues before baby arrives, the better!  Fun and Frills (Don't forget these. and don't feel guilty for indulging in them!) Everyone has something in life that is a fun little treat that they do just for themselves. It may be: reading the morning paper, or going for a daily jog,  or having coffee with a friend once a week, or going to a movie on Friday nights, or fine chocolates, or bubble baths, or curling up with a good  book. Unless you do fun things for yourself every now and then, it's hard to have the energy for fun with your baby. Whatever your "special" treats are, make sure you find a way to continue to indulge in them after your baby is born. These special moments can recharge you, and allow you to return to baby with a new joy   PERINATAL MOOD DISORDERS: MATERNAL MENTAL HEALTH FROM CONCEPTION THROUGH THE POSTPARTUM PERIOD   _________________________________________Emergency and Crisis Resources If you are an imminent risk to self or others, are experiencing intense personal distress, and/or have noticed significant changes in activities of daily living, call:  911 [facility]: [phone]  [address], [place], [place], [address] Mobile Crisis: [phone] National Suicide Hotline: 988 Or visit the following crisis centers: Local Emergency Departments [facility]: [address] [place]  [phone]. Hours: 8:30AM-5PM. Insurance Accepted: Medicaid, Medicare, and Uninsured.  [facility]:  [address] [place]  Mon-Friday 8am-3pm, [phone]                                                                                  ___________ Non-Crisis Resources To identify specific providers that are covered by your insurance, contact your insurance company or local agencies:  [place] [employer]: [phone] [facility]: [phone] [employer] [place] [employer]: 3850461852 Postpartum Support International- Warm-line: 510 446 4996                                                      __Outpatient Therapy and Medication Management   Providers:  [facility]: [phone] Hours: 9AM-5PM  Insurance Accepted: Pilar Jarvis, Chase Caller, [employer], Medicare  [facility] of Care): [phone] Hours: 8AM-5:30PM  nsurance Accepted: All insurances EXCEPT AARP, [place],  [place], and [employer] of the [place]: [phone] Hours: 8AM-8PM Insurance Accepted: Ulla Gallo, Crista Luria, [place], Medicare, [phone] [facility]: [phone] Hours: 8:30AM-7PM Insurance Accepted: Ulla Gallo, Medicaid, Medicare, Tricare, [employer] Counseling:  [phone]   Hours:9AM-5PM Insurance Accepted:  Monia Pouch, Ezequiel Essex, [employer], [place], [employer] Care  [facility]: [phone] Hours: 9AM-5:30PM Insurance Accepted: Pilar Jarvis, Teodoro Spray, and Medicaid, Medicare, [facility] [place] [facility]:  [phone] Hours: 9am-5pm Insurance Accepted: BCBS; they do not accept Medicaid/Medicare [facility]: [phone] Hours: 9am-9pm Insurance Accepted: All major insurance including Medicaid and Medicare [facility]: [phone] Hours: 9AM- 5PM Insurance Accepted: All insurances EXCEPT Medicaid and Medicare. [facility]: [phone]   ____________  Parenting Support Groups [facility]: [phone] [facility]:  [phone] Family Support Network: (support for children in the NICU and/or with special needs), [phone]   ___________                                                                 Mental Health Support Groups Mental Health Association: [phone]    _____________                                                                                  Online Resources Postpartum Support International: SeekAlumni.co.za  800-944-4PPD Supporting Moms:  www.momssupportingmoms.net

## 2023-05-17 ENCOUNTER — Telehealth: Payer: Self-pay | Admitting: Lactation Services

## 2023-05-17 NOTE — Telephone Encounter (Signed)
Called and spoke with patient.   She reports she did not mention breast feeding and does not have any concerns about it.   She has extra fat under an arm that is painful. Reviewed with patient it can be milk producing cells and if continues may need an ultrasound for evaluation and can speak with Provider at her next visit.   Patient reports she has no questions or concerns for Lactation.

## 2023-05-17 NOTE — Telephone Encounter (Signed)
-----   Message from Denise Tanner [facility] sent at 05/17/2023  9:41 AM EST ----- Dr Donavan Foil, Pt may want to discuss the following at upcoming appt:   1. worry regarding "extra fat under one arm" experienced last pregnancy that was "swollen with milk and hard as a rock"; area is now sore and starting to get hard again; 2. noticing getting out of breath fast.  3. Worries about cornstarch cravings; how much is safe to eat daily(ate plain cornstarch with last pregnancy as well)  Denise Tanner,   If pt sees you (she's amenable to seeing you prior to birth):   1. Pt worries if she breastfeeds this time, baby may get too clingy.  2. Any tips about reducing problem #1 (above)  Thanks y'all! Asher Muir

## 2023-05-26 NOTE — BH Specialist Note (Signed)
 Pt answered phone, but phone cut off;  did not arrive to video visit and did not answer the phone after first call; Unable to leave voicemail; left MyChart message for patient.

## 2023-05-27 ENCOUNTER — Ambulatory Visit: Payer: Medicaid Other | Attending: Maternal & Fetal Medicine

## 2023-05-27 ENCOUNTER — Other Ambulatory Visit: Payer: Self-pay

## 2023-05-27 DIAGNOSIS — Z3A27 27 weeks gestation of pregnancy: Secondary | ICD-10-CM | POA: Diagnosis not present

## 2023-05-27 DIAGNOSIS — O09899 Supervision of other high risk pregnancies, unspecified trimester: Secondary | ICD-10-CM | POA: Diagnosis present

## 2023-05-27 DIAGNOSIS — O283 Abnormal ultrasonic finding on antenatal screening of mother: Secondary | ICD-10-CM | POA: Diagnosis not present

## 2023-05-27 DIAGNOSIS — Z362 Encounter for other antenatal screening follow-up: Secondary | ICD-10-CM

## 2023-06-03 ENCOUNTER — Encounter: Payer: Medicaid Other | Admitting: Obstetrics

## 2023-06-08 ENCOUNTER — Ambulatory Visit: Payer: Self-pay

## 2023-06-09 NOTE — L&D Delivery Note (Signed)
 OB/GYN Faculty Practice Delivery Note  Denise Tanner is a 23 y.o. [id number] s/p SVD at [redacted]w[redacted]d. She was admitted for SOL.   ROM: rupture date, rupture time, delivery date, or delivery time have not been documented with clear fluid GBS Status: Unknown  Maximum Maternal Temperature: 97.44F  Labor Progress: Initial SVE: 5.5/70/-2. AROM. She then progressed to complete @0640 .   Delivery Date/Time: 08/22/2023 @0649   Delivery: Called to room and patient was complete and pushing. Head delivered LOA. Double loose nuchal cord present, delivered through. Shoulder and body delivered in usual fashion. Infant with spontaneous cry, placed on mother's abdomen, dried and stimulated. Cord clamped x 2 after 1-minute delay, and cut by FOB. Cord blood drawn. Placenta delivered spontaneously with gentle cord traction. Fundus firm with massage and Pitocin. Labia, perineum, vagina, and cervix inspected without lesion.   Baby Weight: 3100g  Placenta: 3 vessel, intact. Sent to L&D Complications: None Lacerations: None EBL: 87 mL Analgesia: None  Infant:  APGAR (1 MIN): 8  APGAR (5 MINS): 9   Wyn Forster, MD OB Family Medicine Fellow, [facility] for [facility], [facility] 08/22/2023, 8:44 AM

## 2023-06-17 ENCOUNTER — Inpatient Hospital Stay (HOSPITAL_COMMUNITY)
Admission: AD | Admit: 2023-06-17 | Discharge: 2023-06-17 | Disposition: A | Discharge status: Home / Self Care | Payer: Medicaid Other | Attending: Obstetrics and Gynecology | Admitting: Obstetrics and Gynecology

## 2023-06-17 ENCOUNTER — Other Ambulatory Visit: Payer: Self-pay

## 2023-06-17 ENCOUNTER — Encounter (HOSPITAL_COMMUNITY): Payer: Self-pay | Admitting: Obstetrics and Gynecology

## 2023-06-17 DIAGNOSIS — B9789 Other viral agents as the cause of diseases classified elsewhere: Secondary | ICD-10-CM | POA: Diagnosis not present

## 2023-06-17 DIAGNOSIS — J069 Acute upper respiratory infection, unspecified: Secondary | ICD-10-CM | POA: Insufficient documentation

## 2023-06-17 DIAGNOSIS — Z3A30 30 weeks gestation of pregnancy: Secondary | ICD-10-CM | POA: Insufficient documentation

## 2023-06-17 DIAGNOSIS — O98513 Other viral diseases complicating pregnancy, third trimester: Secondary | ICD-10-CM | POA: Diagnosis present

## 2023-06-17 DIAGNOSIS — O99513 Diseases of the respiratory system complicating pregnancy, third trimester: Secondary | ICD-10-CM | POA: Diagnosis not present

## 2023-06-17 DIAGNOSIS — O26893 Other specified pregnancy related conditions, third trimester: Secondary | ICD-10-CM

## 2023-06-17 DIAGNOSIS — Z1152 Encounter for screening for COVID-19: Secondary | ICD-10-CM | POA: Insufficient documentation

## 2023-06-17 HISTORY — DX: Anxiety disorder, unspecified: F41.9

## 2023-06-17 LAB — RESP PANEL BY RT-PCR (RSV, FLU A&B, COVID)  RVPGX2
Influenza A by PCR: NEGATIVE
Influenza B by PCR: NEGATIVE
Resp Syncytial Virus by PCR: NEGATIVE
SARS Coronavirus 2 by RT PCR: NEGATIVE

## 2023-06-17 LAB — RUPTURE OF MEMBRANE (ROM)PLUS: Rom Plus: NEGATIVE

## 2023-06-17 MED ORDER — ONDANSETRON 4 MG PO TBDP: 4.0000 mg | ORAL_TABLET | Freq: Three times a day (TID) | ORAL | 0 refills | Status: DC | PRN

## 2023-06-17 MED ORDER — ACETAMINOPHEN 500 MG PO TABS
1000.0000 mg | ORAL_TABLET | Freq: Once | ORAL | Status: AC
  Administered 2023-06-17: 1000 mg via ORAL
  Filled 2023-06-17: qty 2

## 2023-06-17 NOTE — Discharge Instructions (Signed)
 It was a pleasure taking care of you today.  I think you have some viral illness and management is just supportive.  Be sure to drink plenty of water.  He can have hot tea with honey to help with your sore throat.  Below is a list of medications that are safe to take during pregnancy.  Let me know if you have any questions.  Safe Medications in Pregnancy   Acne:  Benzoyl Peroxide  Salicylic Acid   Backache/Headache:  Tylenol : 2 regular strength every 4 hours OR               2 Extra strength every 6 hours   Colds/Coughs/Allergies:  Benadryl  (alcohol free) 25 mg every 6 hours as needed  Breath right strips  Claritin  Cepacol throat lozenges  Chloraseptic throat spray  Cold-Eeze- up to three times per day  Cough drops, alcohol free  Flonase  (by prescription only)  Guaifenesin  Mucinex  Robitussin DM (plain only, alcohol free)  Saline nasal spray/drops  Sudafed (pseudoephedrine) & Actifed * use only after [redacted] weeks gestation and if you do not have high blood pressure  Tylenol   Vicks Vaporub  Zinc lozenges  Zyrtec    Constipation:  Colace  Ducolax suppositories  Fleet enema  Glycerin  suppositories  Metamucil  Milk of magnesia  Miralax  Senokot  Smooth move tea   Diarrhea:  Kaopectate  Imodium A-D   *NO pepto Bismol   Hemorrhoids:  Anusol   Anusol  HC  Preparation H  Tucks   Indigestion:  Tums  Maalox  Mylanta  Zantac  Pepcid   Insomnia:  Benadryl  (alcohol free) 25mg  every 6 hours as needed  Tylenol  PM  Unisom, no Gelcaps   Leg Cramps:  Tums  MagGel   Nausea/Vomiting:  Bonine  Dramamine  Emetrol  Ginger extract  Sea bands  Meclizine  Nausea medication to take during pregnancy:  Unisom (doxylamine succinate 25 mg tablets) Take one tablet daily at bedtime. If symptoms are not adequately controlled, the dose can be increased to a maximum recommended dose of two tablets daily (1/2 tablet in the morning, 1/2 tablet mid-afternoon and one at bedtime).   Vitamin B6 100mg  tablets. Take one tablet twice a day (up to 200 mg per day).   Skin Rashes:  Aveeno products  Benadryl  cream or 25mg  every 6 hours as needed  Calamine Lotion  1% cortisone cream   Yeast infection:  Gyne-lotrimin  7  Monistat  7    **If taking multiple medications, please check labels to avoid duplicating the same active ingredients  **take medication as directed on the label  ** Do not exceed 4000 mg of tylenol  in 24 hours  **Do not take medications that contain aspirin  or ibuprofen 

## 2023-06-17 NOTE — MAU Provider Note (Signed)
 History     CSN: [id number]  Arrival date and time: 06/17/23 1304   None    Chief Complaint  Patient presents with   Nasal Congestion   Abdominal Pain   Sore Throat   Patient is a 23 yo [id number] female at [redacted]w[redacted]d with history of short interval between pregnancies who presents to MAU today with complaint of abdominal pain, vomiting, and ear/throat pain. Yesterday morning at ~8 am, patient had diffuse abdominal cramps (feeling balled up like she had to go to bathroom) and thinks she had small contractions that were regular. A few hours later at ~12 pm, patient started having NBNB food-colored vomiting and was unable to tolerate liquids or solids. Today at ~4 am, patient had another vomiting episode with clear fluid leakage for 3-4 minutes. Today, patient also started having bilateral ear and throat pain, currently 10/10. Tried applying warm cloth on ears w/o improvement. Otherwise has not tried any meds. Didn't receive flu vaccine this season, otherwise UTD on vaccines. No known recent sick contacts or travel. Endorses nasal congestion, rhinorrhea, mild cough, mild chest pain, diffuse myalgia, nausea, diffuse abdominal pain 7-8/10. Denies fever, HA, vision changes, SOB, dysuria, urinary frequency, vaginal bleeding, leakage of fluid. No current meds/OTC/herbs/supplements.  OB History     Gravida  4   Para  1   Term  1   Preterm  0   AB  2   Living  1      SAB  2   IAB  0   Ectopic  0   Multiple  0   Live Births  1         Last live birth in November 2023 [facility] for current pregnancy started at [redacted]w[redacted]d - had to reschedule follow up appointment  Past Medical History:  Diagnosis Date   Anxiety    BV (bacterial vaginosis)    Gonorrhea    Medical history non-contributory    UTI (urinary tract infection)    Past Surgical History:  Procedure Laterality Date   NO PAST SURGERIES     WISDOM TOOTH EXTRACTION     Family History  Problem Relation Age of Onset   Cancer Mother     Sickle cell anemia Mother    Hypothyroidism Mother    Breast cancer Mother    Healthy Father        was killed   Asthma Brother    Hypertension Maternal Grandmother    Hypothyroidism Maternal Grandmother    Diabetes Paternal Grandmother    Heart disease Neg Hx    Social History   Tobacco Use   Smoking status: Never    Passive exposure: Never   Smokeless tobacco: Never  Vaping Use   Vaping status: Never Used  Substance Use Topics   Alcohol use: No   Drug use: No   Allergies: No Known Allergies  Medications Prior to Admission  Medication Sig Dispense Refill Last Dose/Taking   Blood Pressure Monitoring (BLOOD PRESSURE KIT) DEVI 1 Device by Does not apply route once a week. (Patient not taking: Reported on 05/05/2023) 1 each 0    Prenatal Vit-Fe Phos-FA-Omega (VITAFOL  GUMMIES) 3.33-0.333-34.8 MG CHEW Chew 3 tablets by mouth daily. 90 tablet 11   Not currently taking prenatal vitamins - last taken 1 month ago  Review of Systems  Constitutional:  Positive for fatigue. Negative for fever.  HENT:  Positive for congestion, ear pain, rhinorrhea, sore throat and trouble swallowing. Negative for ear discharge.   Eyes:  Negative  for discharge and visual disturbance.  Respiratory:  Positive for cough. Negative for shortness of breath.   Cardiovascular:  Negative for chest pain and leg swelling.  Gastrointestinal:  Positive for abdominal pain, nausea and vomiting.  Genitourinary:  Negative for dysuria and vaginal bleeding.  Musculoskeletal:  Positive for myalgias.  Neurological:  Negative for headaches.   Physical Exam   Blood pressure (!) 102/54, pulse (!) 119, temperature 98.8 F (37.1 C), temperature source Oral, resp. rate 19, height 5' 5 (1.651 m), weight 74.6 kg, last menstrual period 11/22/2022, currently breastfeeding.  Physical Exam Vitals and nursing note reviewed.  Constitutional:      Appearance: She is well-developed.  HENT:     Head: Normocephalic and  atraumatic.     Right Ear: Hearing, tympanic membrane, ear canal and external ear normal.     Left Ear: Hearing, tympanic membrane, ear canal and external ear normal.     Nose: Congestion and rhinorrhea present.     Mouth/Throat:     Mouth: Mucous membranes are moist. No oral lesions.     Pharynx: Oropharynx is clear. Uvula midline. No pharyngeal swelling, oropharyngeal exudate, posterior oropharyngeal erythema or uvula swelling.     Tonsils: No tonsillar exudate.  Eyes:     Extraocular Movements: Extraocular movements intact.     Conjunctiva/sclera: Conjunctivae normal.     Pupils: Pupils are equal, round, and reactive to light.  Cardiovascular:     Rate and Rhythm: Regular rhythm. Tachycardia present.     Heart sounds: No murmur heard. Pulmonary:     Effort: Pulmonary effort is normal.     Breath sounds: Normal breath sounds.  Abdominal:     Palpations: Abdomen is soft.     Tenderness: There is generalized abdominal tenderness.  Musculoskeletal:     Cervical back: Normal range of motion and neck supple.  Skin:    General: Skin is warm.  Neurological:     General: No focal deficit present.     Mental Status: She is alert.  Psychiatric:        Mood and Affect: Mood normal.        Behavior: Behavior normal.    MAU Course   MDM: moderate  This patient presents to the ED for concern of   Chief Complaint  Patient presents with   Nasal Congestion   Abdominal Pain   Sore Throat   The differential diagnosis for  1. Abdominal pain with vomiting and fluid leakage INCLUDES PROM vs pregnancy-related nausea and vomiting vs GERD  2. Sore throat and nasal congestion INCLUDES most likely viral URI vs viral pharyngitis  External records from outside source obtained and reviewed including Prenatal care records  Lab Tests: Orders Placed This Encounter  Procedures   Resp panel by RT-PCR (RSV, Flu A&B, Covid) Anterior Nasal Swab    Standing Status:   Standing    Number of  Occurrences:   1   Rupture of Membrane (ROM) Plus    Standing Status:   Standing    Number of Occurrences:   1   Airborne and Contact precautions    Standing Status:   Standing    Number of Occurrences:   1   I ordered and personally interpreted labs.  The pertinent results include:    ROM plus negative  Respiratory panel negative  Medicines ordered and prescription drug management: Meds ordered this encounter  Medications   acetaminophen  (TYLENOL ) tablet 1,000 mg    Reevaluation of the patient after  these medicines showed that the patient improved I have reviewed the patient's home medicines and have made adjustments as needed  Dispostion: discharged   Assessment and Plan  [redacted] weeks gestation - Follow-up appointment already rescheduled for 06/22/2023 [id number] Viral URI - Counseled patient about symptomatic management Pregnancy-related nausea and vomiting - Sent home with prescription for Zofran  - Provided list of medications safe to take during pregnancy  Bearl Feeling, MS3 06/17/2023, 1:43 PM

## 2023-06-17 NOTE — MAU Note (Addendum)
 Denise Tanner is a 23 y.o. at [redacted]w[redacted]d here in MAU reporting: patient reports her stomach started feeling like it was balled up yesterday. She had 5 occurrences of vomiting, and a few loose stools. She also reports an earache, and sore throat. Denies VB and LOF. Endorses +FM.  Onset of complaint: yesterday  Pain score: 6/10 abdomen                    10/10 throat                     10/10 ear Vitals:   06/17/23 1329  BP: (!) 102/54  Pulse: (!) 119  Resp: 19  Temp: 98.8 F (37.1 C)     FHT:155 Lab orders placed from triage: none

## 2023-06-22 ENCOUNTER — Encounter: Payer: Medicaid Other | Admitting: Obstetrics

## 2023-06-25 ENCOUNTER — Encounter: Payer: Medicaid Other | Admitting: Obstetrics & Gynecology

## 2023-06-27 ENCOUNTER — Ambulatory Visit (HOSPITAL_COMMUNITY)
Admission: EM | Admit: 2023-06-27 | Discharge: 2023-06-27 | Discharge status: Against Medical Advice | Payer: Medicaid Other

## 2023-06-30 ENCOUNTER — Encounter: Payer: Medicaid Other | Admitting: Obstetrics and Gynecology

## 2023-07-01 ENCOUNTER — Telehealth: Payer: Self-pay

## 2023-07-01 NOTE — Telephone Encounter (Signed)
Pt called crying in pain and could barely speak, stated that she has been having contractions every 1-2 minutes that started at 4am but got worse throughout the day. Pt denies vaginal bleeding and leaking of fluid. Advised pt to report to mau ASAP, pt agreed and stated that she has a ride and will go now.

## 2023-07-14 ENCOUNTER — Encounter: Payer: Medicaid Other | Admitting: Obstetrics and Gynecology

## 2023-07-14 ENCOUNTER — Inpatient Hospital Stay (HOSPITAL_COMMUNITY)
Admission: AD | Admit: 2023-07-14 | Discharge: 2023-07-14 | Disposition: A | Discharge status: Home / Self Care | Payer: Medicaid Other | Attending: Obstetrics and Gynecology | Admitting: Obstetrics and Gynecology

## 2023-07-14 ENCOUNTER — Encounter (HOSPITAL_COMMUNITY): Payer: Self-pay | Admitting: Obstetrics and Gynecology

## 2023-07-14 DIAGNOSIS — O99513 Diseases of the respiratory system complicating pregnancy, third trimester: Secondary | ICD-10-CM | POA: Insufficient documentation

## 2023-07-14 DIAGNOSIS — O479 False labor, unspecified: Secondary | ICD-10-CM

## 2023-07-14 DIAGNOSIS — O26893 Other specified pregnancy related conditions, third trimester: Secondary | ICD-10-CM | POA: Insufficient documentation

## 2023-07-14 DIAGNOSIS — Z3A33 33 weeks gestation of pregnancy: Secondary | ICD-10-CM | POA: Diagnosis not present

## 2023-07-14 DIAGNOSIS — R112 Nausea with vomiting, unspecified: Secondary | ICD-10-CM | POA: Diagnosis not present

## 2023-07-14 DIAGNOSIS — J101 Influenza due to other identified influenza virus with other respiratory manifestations: Secondary | ICD-10-CM | POA: Insufficient documentation

## 2023-07-14 DIAGNOSIS — O4703 False labor before 37 completed weeks of gestation, third trimester: Secondary | ICD-10-CM | POA: Diagnosis present

## 2023-07-14 DIAGNOSIS — O212 Late vomiting of pregnancy: Secondary | ICD-10-CM | POA: Insufficient documentation

## 2023-07-14 DIAGNOSIS — R52 Pain, unspecified: Secondary | ICD-10-CM | POA: Diagnosis not present

## 2023-07-14 LAB — COMPREHENSIVE METABOLIC PANEL
ALT: 8 U/L (ref 0–44)
AST: 16 U/L (ref 15–41)
Albumin: 2.5 g/dL — ABNORMAL LOW (ref 3.5–5.0)
Alkaline Phosphatase: 89 U/L (ref 38–126)
Anion gap: 8 (ref 5–15)
BUN: <5 mg/dL — ABNORMAL LOW (ref 6–20)
CO2: 21 mmol/L — ABNORMAL LOW (ref 22–32)
Calcium: 8.2 mg/dL — ABNORMAL LOW (ref 8.9–10.3)
Chloride: 106 mmol/L (ref 98–111)
Creatinine, Ser: 0.63 mg/dL (ref 0.44–1.00)
GFR, Estimated: >60 mL/min (ref >60)
Glucose, Bld: 85 mg/dL (ref 70–99)
Potassium: 3.9 mmol/L (ref 3.5–5.1)
Sodium: 135 mmol/L (ref 135–145)
Total Bilirubin: 0.6 mg/dL (ref 0.0–1.2)
Total Protein: 6 g/dL — ABNORMAL LOW (ref 6.5–8.1)

## 2023-07-14 LAB — CBC
HCT: 30 % — ABNORMAL LOW (ref 36.0–46.0)
Hemoglobin: 9.7 g/dL — ABNORMAL LOW (ref 12.0–15.0)
MCH: 24.4 pg — ABNORMAL LOW (ref 26.0–34.0)
MCHC: 32.3 g/dL (ref 30.0–36.0)
MCV: 75.6 fL — ABNORMAL LOW (ref 80.0–100.0)
Platelets: 229 10*3/uL (ref 150–400)
RBC: 3.97 MIL/uL (ref 3.87–5.11)
RDW: 14.1 % (ref 11.5–15.5)
WBC: 10.4 10*3/uL (ref 4.0–10.5)
nRBC: 0 % (ref 0.0–0.2)

## 2023-07-14 LAB — LIPASE, BLOOD: Lipase: 22 U/L (ref 11–51)

## 2023-07-14 LAB — RESP PANEL BY RT-PCR (RSV, FLU A&B, COVID)  RVPGX2
Influenza A by PCR: POSITIVE — AB
Influenza B by PCR: NEGATIVE
Resp Syncytial Virus by PCR: NEGATIVE
SARS Coronavirus 2 by RT PCR: NEGATIVE

## 2023-07-14 LAB — TROPONIN I (HIGH SENSITIVITY): Troponin I (High Sensitivity): <2 ng/L (ref <18)

## 2023-07-14 MED ORDER — LACTATED RINGERS IV SOLN: Freq: Once | INTRAVENOUS | Status: AC

## 2023-07-14 MED ORDER — SUCRALFATE 1 GM/10ML PO SUSP
1.0000 g | Freq: Once | ORAL | Status: AC
  Administered 2023-07-14: 1 g via ORAL
  Filled 2023-07-14: qty 10

## 2023-07-14 MED ORDER — ONDANSETRON 4 MG PO TBDP: 4.0000 mg | ORAL_TABLET | Freq: Four times a day (QID) | ORAL | 1 refills | Status: DC | PRN

## 2023-07-14 MED ORDER — SODIUM CHLORIDE 0.9 % IV SOLN
12.5000 mg | Freq: Once | INTRAVENOUS | Status: AC
  Administered 2023-07-14: 12.5 mg via INTRAVENOUS
  Filled 2023-07-14: qty 12.5

## 2023-07-14 MED ORDER — OSELTAMIVIR PHOSPHATE 75 MG PO CAPS: 75.0000 mg | ORAL_CAPSULE | Freq: Two times a day (BID) | ORAL | 0 refills | Status: DC

## 2023-07-14 NOTE — MAU Provider Note (Signed)
 Chief Complaint:  Chest Pain, Nausea, and Dizziness   Event Date/Time   First Provider Initiated Contact with Patient 07/14/23 0415     HPI: Denise Tanner is a 23 y.o. [id number] at 65w6dwho presents via EMS to maternity admissions reporting nausea and vomiting.  Also has lightheadedness, body aches, and some sharp pain in left chest wall.  Does not have nausea meds at home. .Denies cough or sore throat.  At end of visit, just before discharge, she mentions her baby's father has been sick all week.  She reports good fetal movement, denies LOF, vaginal bleeding, urinary symptoms, dizziness, diarrhea, constipation or fever/chills.    Emesis  This is a new problem. The current episode started today. There has been no fever. Associated symptoms include abdominal pain, chest pain, dizziness and myalgias. Pertinent negatives include no chills, coughing, diarrhea, fever or headaches. Risk factors include ill contacts. She has tried nothing for the symptoms.   RN Note: Denise Tanner is a 23 y.o. at [redacted]w[redacted]d here in MAU reporting: Patient reports has been feeling lightheaded and dizzy throughout the day. Denies syncope.  Patient ate rice and mac n cheese and reports around 1700 was throwing up continuously and felt SOB, pt reports called EMS because felt L sided chest pain. Patient reports has L sided chest pain that radiates into her left shoulder and L upper back. Patient reports this chest pain is constant and first time experiencing this.  Patient reports emesis 4x.  Patient reports DFM as well reports 1x movement in the past hour.  Patient reports lower abdominal pain that is intermittent , patient reports has had this pain intermittently for the past 2 weeks, believes they are contractions  Denies LOF, VB.   Past Medical History: Past Medical History:  Diagnosis Date   Anxiety    BV (bacterial vaginosis)    Gonorrhea    Medical history non-contributory    UTI (urinary tract infection)      Past obstetric history: OB History  Gravida Para Term Preterm AB Living  4 1 1  0 2 1  SAB IAB Ectopic Multiple Live Births  2 0 0 0 1    # Outcome Date GA Lbr Len/2nd Weight Sex Type Anes PTL Lv  4 Current           3 Term 04/16/22 [redacted]w[redacted]d 10:40 / 01:40 3250 g M Vag-Spont EPI  LIV     Birth Comments: wnl  2 SAB           1 SAB             Past Surgical History: Past Surgical History:  Procedure Laterality Date   NO PAST SURGERIES     WISDOM TOOTH EXTRACTION      Family History: Family History  Problem Relation Age of Onset   Cancer Mother    Sickle cell anemia Mother    Hypothyroidism Mother    Breast cancer Mother    Healthy Father        was killed   Asthma Brother    Hypertension Maternal Grandmother    Hypothyroidism Maternal Grandmother    Diabetes Paternal Grandmother    Heart disease Neg Hx     Social History: Social History   Tobacco Use   Smoking status: Never    Passive exposure: Never   Smokeless tobacco: Never  Vaping Use   Vaping status: Never Used  Substance Use Topics   Alcohol use: No   Drug use: No  Allergies: No Known Allergies  Meds:  Medications Prior to Admission  Medication Sig Dispense Refill Last Dose/Taking   Blood Pressure Monitoring (BLOOD PRESSURE KIT) DEVI 1 Device by Does not apply route once a week. (Patient not taking: Reported on 05/05/2023) 1 each 0    ondansetron  (ZOFRAN -ODT) 4 MG disintegrating tablet Take 1 tablet (4 mg total) by mouth every 8 (eight) hours as needed for nausea or vomiting. 20 tablet 0    Prenatal Vit-Fe Phos-FA-Omega (VITAFOL  GUMMIES) 3.33-0.333-34.8 MG CHEW Chew 3 tablets by mouth daily. 90 tablet 11     I have reviewed patient's Past Medical Hx, Surgical Hx, Family Hx, Social Hx, medications and allergies.   ROS:  Review of Systems  Constitutional:  Negative for chills and fever.  Respiratory:  Negative for cough.   Cardiovascular:  Positive for chest pain.  Gastrointestinal:  Positive  for abdominal pain and vomiting. Negative for diarrhea.  Musculoskeletal:  Positive for myalgias.  Neurological:  Positive for dizziness. Negative for headaches.   Other systems negative  Physical Exam  Patient Vitals for the past 24 hrs:  BP Temp Temp src Pulse Resp SpO2  07/14/23 0359 106/66 99.3 F (37.4 C) Oral (!) 109 18 99 %   Constitutional: Well-developed, well-nourished female in no acute distress.  Cardiovascular: normal rate and rhythm Respiratory: normal effort, clear to auscultation bilaterally GI: Abd soft, non-tender, gravid appropriate for gestational age.   No rebound or guarding. MS: Extremities nontender, no edema, normal ROM Neurologic: Alert and oriented x 4.  GU: Neg CVAT.  PELVIC EXAM: Dilation: Closed Effacement (%): Thick Station: Ballotable Exam by:: CHRISTELLA Pouch CNM    FHT:  Baseline 145 , moderate variability, accelerations present, no decelerations Contractions: Irregular    Labs: Results for orders placed or performed during the hospital encounter of 07/14/23 (from the past 24 hours)  CBC     Status: Abnormal   Collection Time: 07/14/23  4:31 AM  Result Value Ref Range   WBC 10.4 4.0 - 10.5 K/uL   RBC 3.97 3.87 - 5.11 MIL/uL   Hemoglobin 9.7 (L) 12.0 - 15.0 g/dL   HCT 69.9 (L) 63.9 - 53.9 %   MCV 75.6 (L) 80.0 - 100.0 fL   MCH 24.4 (L) 26.0 - 34.0 pg   MCHC 32.3 30.0 - 36.0 g/dL   RDW 85.8 88.4 - 84.4 %   Platelets 229 150 - 400 K/uL   nRBC 0.0 0.0 - 0.2 %  Comprehensive metabolic panel     Status: Abnormal   Collection Time: 07/14/23  4:31 AM  Result Value Ref Range   Sodium 135 135 - 145 mmol/L   Potassium 3.9 3.5 - 5.1 mmol/L   Chloride 106 98 - 111 mmol/L   CO2 21 (L) 22 - 32 mmol/L   Glucose, Bld 85 70 - 99 mg/dL   BUN <5 (L) 6 - 20 mg/dL   Creatinine, Ser 9.36 0.44 - 1.00 mg/dL   Calcium  8.2 (L) 8.9 - 10.3 mg/dL   Total Protein 6.0 (L) 6.5 - 8.1 g/dL   Albumin 2.5 (L) 3.5 - 5.0 g/dL   AST 16 15 - 41 U/L   ALT 8 0 - 44 U/L    Alkaline Phosphatase 89 38 - 126 U/L   Total Bilirubin 0.6 0.0 - 1.2 mg/dL   GFR, Estimated >39 >39 mL/min   Anion gap 8 5 - 15  Troponin I (High Sensitivity)     Status: None   Collection Time: 07/14/23  4:31 AM  Result Value Ref Range   Troponin I (High Sensitivity) <2 <18 ng/L  Lipase, blood     Status: None   Collection Time: 07/14/23  4:31 AM  Result Value Ref Range   Lipase 22 11 - 51 U/L  Resp panel by RT-PCR (RSV, Flu A&B, Covid) Anterior Nasal Swab     Status: Abnormal   Collection Time: 07/14/23  5:04 AM   Specimen: Anterior Nasal Swab  Result Value Ref Range   SARS Coronavirus 2 by RT PCR NEGATIVE NEGATIVE   Influenza A by PCR POSITIVE (A) NEGATIVE   Influenza B by PCR NEGATIVE NEGATIVE   Resp Syncytial Virus by PCR NEGATIVE NEGATIVE    O/Positive/-- (10/02 1055)  Imaging:  12 lead EKG showed sinus tachycardia.  MAU Course/MDM: I have reviewed the triage vital signs and the nursing notes.   Pertinent labs & imaging results that were available during my care of the patient were reviewed by me and considered in my medical decision making (see chart for details).      I have reviewed her medical records including past results, notes and treatments.   I have ordered labs and reviewed results. No leukocytosis or hypokalemia.  Troponin is negative.  Swab showed Influenza A.  NST reviewed  Treatments in MAU included IV hydration, Phenergan  which did help nausea  Contractions diminished with hydration.   Carafate  helped chest pain.  Discussed usual symptoms of influenza.  Will message office to reschedule today's appointment. .    Assessment: Single IUP at [redacted]w[redacted]d Nausea and vomiting Body aches Irregular uterine contractions Influenza A  Plan: Discharge home Rx Tamiflu  for Influenza Preterm Labor precautions and fetal kick counts Follow up in Office for prenatal visits and recheck Encouraged to return if she develops worsening of symptoms, increase in pain, fever, or  other concerning symptoms.   Pt stable at time of discharge.  Earnie Pouch CNM, MSN Certified Nurse-Midwife 07/14/2023 4:15 AM

## 2023-07-14 NOTE — MAU Note (Signed)
.  Denise Tanner is a 23 y.o. at [redacted]w[redacted]d here in MAU reporting: Patient reports has been feeling lightheaded and dizzy throughout the day. Denies syncope.  Patient ate rice and mac n cheese and reports around 1700 was throwing up continuously and felt SOB, pt reports called EMS because felt L sided chest pain. Patient reports has L sided chest pain that radiates into her left shoulder and L upper back. Patient reports this chest pain is constant and first time experiencing this.  Patient reports emesis 4x.  Patient reports DFM as well reports 1x movement in the past hour.  Patient reports lower abdominal pain that is intermittent , patient reports has had this pain intermittently for the past 2 weeks, believes they are contractions  Denies LOF, VB.   Per EMS received 4 mg of zofran  en route    Onset of complaint: 1700 Pain score: 9/10 L side chest into shoulder; 8/10 contractions  Vitals:   07/14/23 0359  BP: 106/66  Pulse: (!) 109  Resp: 18  Temp: 99.3 F (37.4 C)  SpO2: 99%     FHT: 149  Lab orders placed from triage: N/A

## 2023-07-20 ENCOUNTER — Ambulatory Visit (INDEPENDENT_AMBULATORY_CARE_PROVIDER_SITE_OTHER): Payer: Medicaid Other | Admitting: Family Medicine

## 2023-07-20 VITALS — BP 99/66 | HR 85 | Wt 166.0 lb

## 2023-07-20 DIAGNOSIS — F4322 Adjustment disorder with anxiety: Secondary | ICD-10-CM

## 2023-07-20 DIAGNOSIS — J101 Influenza due to other identified influenza virus with other respiratory manifestations: Secondary | ICD-10-CM

## 2023-07-20 DIAGNOSIS — R52 Pain, unspecified: Secondary | ICD-10-CM

## 2023-07-20 DIAGNOSIS — O0933 Supervision of pregnancy with insufficient antenatal care, third trimester: Secondary | ICD-10-CM

## 2023-07-20 DIAGNOSIS — Z3480 Encounter for supervision of other normal pregnancy, unspecified trimester: Secondary | ICD-10-CM

## 2023-07-20 DIAGNOSIS — Z3A34 34 weeks gestation of pregnancy: Secondary | ICD-10-CM

## 2023-07-20 NOTE — Progress Notes (Signed)
ROB, Pt is taking Rx for FLU A.  C/o chest and shoulder pain, dizziness, NV.

## 2023-07-20 NOTE — Progress Notes (Signed)
   PRENATAL VISIT NOTE  Subjective:  Denise Tanner is a 23 y.o. [phone] at [redacted]w[redacted]d being seen today for ongoing prenatal care.  She is currently monitored for the following issues for this low-risk pregnancy and has Adjustment disorder with mixed disturbance of emotions and conduct; Accessory breast tissue of axilla; Supervision of other normal pregnancy, antepartum; Short interval between pregnancies affecting pregnancy, antepartum; and Abnormal umbilical cord on their problem list.  Patient reports no bleeding, no contractions, no cramping, and no leaking.  Contractions: Irritability. Vag. Bleeding: None.  Movement: Present. Denies leaking of fluid.   The following portions of the patient's history were reviewed and updated as appropriate: allergies, current medications, past family history, past medical history, past social history, past surgical history and problem list.   Objective:   Vitals:   07/20/23 1410  BP: 99/66  Pulse: 85  Weight: 166 lb (75.3 kg)    Fetal Status: Fetal Heart Rate (bpm): 136   Movement: Present     General:  Alert, oriented and cooperative. Patient is in no acute distress.  Skin: Skin is warm and dry. No rash noted.   Cardiovascular: Normal heart rate noted  Respiratory: Normal respiratory effort, no problems with respiration noted  Abdomen: Soft, gravid, appropriate for gestational age.  Pain/Pressure: Present     Pelvic: Cervical exam deferred        Extremities: Normal range of motion.  Edema: Trace  Mental Status: Normal mood and affect. Normal behavior. Normal judgment and thought content.  MSK: Tenderness to palpation over chest and back.  Muscular in origin. Assessment and Plan:  Pregnancy: G4P1021 at [redacted]w[redacted]d 1. Supervision of other normal pregnancy, antepartum (Primary) FHR and BP appropriate today  2. Adjustment disorder with anxious mood Missed appointment with behavioral health.  Chart  3. Influenza A 4. Generalized body aches Currently  on Tamiflu.  Has not taken any OTC medications.  Discussed use of Tylenol for the body aches and OTC cough syrup.  5. Limited prenatal care in third trimester Has not had a visit since 23 weeks.  Has only had 2 visits this pregnancy.  Patient is in need of 28-week labs and GTT.  This will be done today after tomorrow.  6. [redacted] weeks gestation of pregnancy   Preterm labor symptoms and general obstetric precautions including but not limited to vaginal bleeding, contractions, leaking of fluid and fetal movement were reviewed in detail with the patient. Please refer to After Visit Summary for other counseling recommendations.   No follow-ups on file.  Future Appointments  Date Time Provider Department Center  07/22/2023  9:00 AM [facility] None  08/03/2023  1:30 PM Brock Bad, MD [facility] None    Celedonio Savage, MD

## 2023-07-20 NOTE — Patient Instructions (Signed)

## 2023-07-22 ENCOUNTER — Other Ambulatory Visit: Payer: Medicaid Other

## 2023-08-03 ENCOUNTER — Encounter: Payer: Medicaid Other | Admitting: Obstetrics

## 2023-08-10 ENCOUNTER — Ambulatory Visit (INDEPENDENT_AMBULATORY_CARE_PROVIDER_SITE_OTHER): Payer: Medicaid Other | Admitting: Obstetrics

## 2023-08-10 VITALS — BP 106/64 | HR 83 | Wt 170.2 lb

## 2023-08-10 DIAGNOSIS — F4322 Adjustment disorder with anxiety: Secondary | ICD-10-CM | POA: Diagnosis not present

## 2023-08-10 DIAGNOSIS — Z3480 Encounter for supervision of other normal pregnancy, unspecified trimester: Secondary | ICD-10-CM

## 2023-08-10 DIAGNOSIS — Z3A37 37 weeks gestation of pregnancy: Secondary | ICD-10-CM

## 2023-08-10 DIAGNOSIS — O0933 Supervision of pregnancy with insufficient antenatal care, third trimester: Secondary | ICD-10-CM | POA: Diagnosis not present

## 2023-08-10 NOTE — Progress Notes (Unsigned)
 Pt presents for rob. Pt has no questions or concerns today

## 2023-08-11 NOTE — Progress Notes (Signed)
 Subjective:  Denise Tanner is a 23 y.o. [phone] at [redacted]w[redacted]d being seen today for ongoing prenatal care.  She is currently monitored for the following issues for this low-risk pregnancy and has Adjustment disorder with mixed disturbance of emotions and conduct; Accessory breast tissue of axilla; Supervision of other normal pregnancy, antepartum; Short interval between pregnancies affecting pregnancy, antepartum; and Abnormal umbilical cord on their problem list.  Patient reports no complaints.  Contractions: Irritability. Vag. Bleeding: None.  Movement: Present. Denies leaking of fluid.   The following portions of the patient's history were reviewed and updated as appropriate: allergies, current medications, past family history, past medical history, past social history, past surgical history and problem list. Problem list updated.  Objective:   Vitals:   08/10/23 1358 08/10/23 1405  BP: (!) 91/55 106/64  Pulse: 93 83  Weight: 170 lb 3.2 oz (77.2 kg)     Fetal Status: Fetal Heart Rate (bpm): 145   Movement: Present     General:  Alert, oriented and cooperative. Patient is in no acute distress.  Skin: Skin is warm and dry. No rash noted.   Cardiovascular: Normal heart rate noted  Respiratory: Normal respiratory effort, no problems with respiration noted  Abdomen: Soft, gravid, appropriate for gestational age. Pain/Pressure: Absent     Pelvic:  Cervical exam deferred        Extremities: Normal range of motion.  Edema: None  Mental Status: Normal mood and affect. Normal behavior. Normal judgment and thought content.   Urinalysis:      Assessment and Plan:  Pregnancy: G4P1021 at [redacted]w[redacted]d  1. Supervision of other normal pregnancy, antepartum (Primary)  2. Limited prenatal care in third trimester  3. Adjustment disorder with anxious mood - clinically stable   There are no diagnoses linked to this encounter. Term labor symptoms and general obstetric precautions including but not limited  to vaginal bleeding, contractions, leaking of fluid and fetal movement were reviewed in detail with the patient. Please refer to After Visit Summary for other counseling recommendations.   Return in about 1 week (around 08/17/2023) for ROB.   Brock Bad, MD 08/10/2023

## 2023-08-17 ENCOUNTER — Encounter: Admitting: Obstetrics & Gynecology

## 2023-08-19 ENCOUNTER — Inpatient Hospital Stay (HOSPITAL_COMMUNITY)
Admission: AD | Admit: 2023-08-19 | Discharge: 2023-08-20 | Disposition: A | Discharge status: Home / Self Care | Attending: Obstetrics and Gynecology | Admitting: Obstetrics and Gynecology

## 2023-08-19 DIAGNOSIS — O479 False labor, unspecified: Secondary | ICD-10-CM

## 2023-08-19 DIAGNOSIS — O26893 Other specified pregnancy related conditions, third trimester: Secondary | ICD-10-CM | POA: Insufficient documentation

## 2023-08-19 DIAGNOSIS — Z3A39 39 weeks gestation of pregnancy: Secondary | ICD-10-CM | POA: Insufficient documentation

## 2023-08-19 DIAGNOSIS — N898 Other specified noninflammatory disorders of vagina: Secondary | ICD-10-CM | POA: Insufficient documentation

## 2023-08-19 DIAGNOSIS — O471 False labor at or after 37 completed weeks of gestation: Secondary | ICD-10-CM | POA: Insufficient documentation

## 2023-08-19 NOTE — MAU Note (Signed)
.  Denise Tanner is a 23 y.o. at [redacted]w[redacted]d here in MAU reporting: contractions every 3 minutes, reports possible SROM at 2145, had a gush of clear fluid and has continued to leak.  Denies VB and reports +FM   Onset of complaint: 2145 Pain score: 8/10 Vitals:   08/20/23 0008  BP: 115/73  Pulse: 89  Resp: 18  Temp: 98.4 F (36.9 C)  SpO2: 99%     FHT: pulled straight to room   Lab orders placed from triage: MAU labor

## 2023-08-20 ENCOUNTER — Encounter (HOSPITAL_COMMUNITY): Payer: Self-pay | Admitting: Obstetrics and Gynecology

## 2023-08-20 DIAGNOSIS — Z3A39 39 weeks gestation of pregnancy: Secondary | ICD-10-CM | POA: Diagnosis not present

## 2023-08-20 DIAGNOSIS — N898 Other specified noninflammatory disorders of vagina: Secondary | ICD-10-CM | POA: Diagnosis not present

## 2023-08-20 DIAGNOSIS — O26893 Other specified pregnancy related conditions, third trimester: Secondary | ICD-10-CM | POA: Diagnosis not present

## 2023-08-20 DIAGNOSIS — O471 False labor at or after 37 completed weeks of gestation: Secondary | ICD-10-CM | POA: Diagnosis present

## 2023-08-20 LAB — POCT FERN TEST: POCT Fern Test: NEGATIVE

## 2023-08-20 LAB — RUPTURE OF MEMBRANE (ROM)PLUS: Rom Plus: NEGATIVE

## 2023-08-22 ENCOUNTER — Encounter (HOSPITAL_COMMUNITY): Payer: Self-pay

## 2023-08-22 ENCOUNTER — Encounter (HOSPITAL_COMMUNITY): Payer: Self-pay | Admitting: Obstetrics & Gynecology

## 2023-08-22 ENCOUNTER — Other Ambulatory Visit: Payer: Self-pay

## 2023-08-22 ENCOUNTER — Inpatient Hospital Stay (HOSPITAL_COMMUNITY)
Admission: AD | Admit: 2023-08-22 | Discharge: 2023-08-23 | DRG: 807 | Disposition: A | Discharge status: Home / Self Care | Payer: Self-pay | Attending: Obstetrics & Gynecology | Admitting: Obstetrics & Gynecology

## 2023-08-22 DIAGNOSIS — Z3A39 39 weeks gestation of pregnancy: Secondary | ICD-10-CM | POA: Diagnosis not present

## 2023-08-22 DIAGNOSIS — Z803 Family history of malignant neoplasm of breast: Secondary | ICD-10-CM

## 2023-08-22 DIAGNOSIS — Z833 Family history of diabetes mellitus: Secondary | ICD-10-CM | POA: Diagnosis not present

## 2023-08-22 DIAGNOSIS — Z8249 Family history of ischemic heart disease and other diseases of the circulatory system: Secondary | ICD-10-CM

## 2023-08-22 DIAGNOSIS — O26893 Other specified pregnancy related conditions, third trimester: Secondary | ICD-10-CM | POA: Diagnosis present

## 2023-08-22 LAB — CBC
HCT: 33.9 % — ABNORMAL LOW (ref 36.0–46.0)
Hemoglobin: 10.7 g/dL — ABNORMAL LOW (ref 12.0–15.0)
MCH: 23.1 pg — ABNORMAL LOW (ref 26.0–34.0)
MCHC: 31.6 g/dL (ref 30.0–36.0)
MCV: 73.1 fL — ABNORMAL LOW (ref 80.0–100.0)
Platelets: 302 10*3/uL (ref 150–400)
RBC: 4.64 MIL/uL (ref 3.87–5.11)
RDW: 15.9 % — ABNORMAL HIGH (ref 11.5–15.5)
WBC: 11.5 10*3/uL — ABNORMAL HIGH (ref 4.0–10.5)
nRBC: 0 % (ref 0.0–0.2)

## 2023-08-22 LAB — TYPE AND SCREEN
ABO/RH(D): O POS
Antibody Screen: NEGATIVE

## 2023-08-22 LAB — RPR: RPR Ser Ql: NONREACTIVE

## 2023-08-22 MED ORDER — BENZOCAINE-MENTHOL 20-0.5 % EX AERO: 1.0000 | INHALATION_SPRAY | CUTANEOUS | Status: DC | PRN

## 2023-08-22 MED ORDER — COCONUT OIL OIL: 1.0000 | TOPICAL_OIL | Status: DC | PRN

## 2023-08-22 MED ORDER — ONDANSETRON HCL 4 MG/2ML IJ SOLN: 4.0000 mg | Freq: Four times a day (QID) | INTRAMUSCULAR | Status: DC | PRN

## 2023-08-22 MED ORDER — ZOLPIDEM TARTRATE 5 MG PO TABS: 5.0000 mg | ORAL_TABLET | Freq: Every evening | ORAL | Status: DC | PRN

## 2023-08-22 MED ORDER — OXYCODONE HCL 5 MG PO TABS
5.0000 mg | ORAL_TABLET | ORAL | Status: DC | PRN
  Administered 2023-08-22: 5 mg via ORAL
  Filled 2023-08-22: qty 1

## 2023-08-22 MED ORDER — ACETAMINOPHEN 325 MG PO TABS
650.0000 mg | ORAL_TABLET | ORAL | Status: DC | PRN
  Administered 2023-08-22 – 2023-08-23 (×3): 650 mg via ORAL
  Filled 2023-08-22 (×3): qty 2

## 2023-08-22 MED ORDER — FENTANYL-BUPIVACAINE-NACL 0.5-0.125-0.9 MG/250ML-% EP SOLN
EPIDURAL | Status: AC
  Filled 2023-08-22: qty 250

## 2023-08-22 MED ORDER — LACTATED RINGERS IV SOLN: INTRAVENOUS | Status: DC

## 2023-08-22 MED ORDER — IBUPROFEN 600 MG PO TABS
600.0000 mg | ORAL_TABLET | Freq: Four times a day (QID) | ORAL | Status: DC
  Administered 2023-08-22 – 2023-08-23 (×6): 600 mg via ORAL
  Filled 2023-08-22 (×6): qty 1

## 2023-08-22 MED ORDER — SODIUM CHLORIDE 0.9% FLUSH: 3.0000 mL | INTRAVENOUS | Status: DC | PRN

## 2023-08-22 MED ORDER — OXYTOCIN BOLUS FROM INFUSION
333.0000 mL | Freq: Once | INTRAVENOUS | Status: AC
  Administered 2023-08-22: 333 mL via INTRAVENOUS

## 2023-08-22 MED ORDER — MEASLES, MUMPS & RUBELLA VAC IJ SOLR: 0.5000 mL | Freq: Once | INTRAMUSCULAR | Status: DC

## 2023-08-22 MED ORDER — OXYCODONE HCL 5 MG PO TABS: 5.0000 mg | ORAL_TABLET | ORAL | Status: DC | PRN

## 2023-08-22 MED ORDER — TETANUS-DIPHTH-ACELL PERTUSSIS 5-2.5-18.5 LF-MCG/0.5 IM SUSY: 0.5000 mL | PREFILLED_SYRINGE | Freq: Once | INTRAMUSCULAR | Status: DC

## 2023-08-22 MED ORDER — PRENATAL MULTIVITAMIN CH
1.0000 | ORAL_TABLET | Freq: Every day | ORAL | Status: DC
  Administered 2023-08-22 – 2023-08-23 (×2): 1 via ORAL
  Filled 2023-08-22 (×2): qty 1

## 2023-08-22 MED ORDER — LIDOCAINE HCL (PF) 1 % IJ SOLN: 30.0000 mL | INTRAMUSCULAR | Status: DC | PRN

## 2023-08-22 MED ORDER — LACTATED RINGERS IV SOLN: 500.0000 mL | INTRAVENOUS | Status: DC | PRN

## 2023-08-22 MED ORDER — ONDANSETRON HCL 4 MG PO TABS: 4.0000 mg | ORAL_TABLET | ORAL | Status: DC | PRN

## 2023-08-22 MED ORDER — SOD CITRATE-CITRIC ACID 500-334 MG/5ML PO SOLN: 30.0000 mL | ORAL | Status: DC | PRN

## 2023-08-22 MED ORDER — FENTANYL CITRATE (PF) 100 MCG/2ML IJ SOLN: 50.0000 ug | INTRAMUSCULAR | Status: DC | PRN

## 2023-08-22 MED ORDER — DIPHENHYDRAMINE HCL 25 MG PO CAPS: 25.0000 mg | ORAL_CAPSULE | Freq: Four times a day (QID) | ORAL | Status: DC | PRN

## 2023-08-22 MED ORDER — OXYTOCIN-SODIUM CHLORIDE 30-0.9 UT/500ML-% IV SOLN: 2.5000 [IU]/h | INTRAVENOUS | Status: DC

## 2023-08-22 MED ORDER — SIMETHICONE 80 MG PO CHEW: 80.0000 mg | CHEWABLE_TABLET | ORAL | Status: DC | PRN

## 2023-08-22 MED ORDER — ACETAMINOPHEN 325 MG PO TABS: 650.0000 mg | ORAL_TABLET | ORAL | Status: DC | PRN

## 2023-08-22 MED ORDER — SODIUM CHLORIDE 0.9% FLUSH
3.0000 mL | Freq: Two times a day (BID) | INTRAVENOUS | Status: DC
  Administered 2023-08-22: 3 mL via INTRAVENOUS

## 2023-08-22 MED ORDER — SENNOSIDES-DOCUSATE SODIUM 8.6-50 MG PO TABS
2.0000 | ORAL_TABLET | ORAL | Status: DC
  Administered 2023-08-22 – 2023-08-23 (×2): 2 via ORAL
  Filled 2023-08-22 (×2): qty 2

## 2023-08-22 MED ORDER — DIBUCAINE (PERIANAL) 1 % EX OINT: 1.0000 | TOPICAL_OINTMENT | CUTANEOUS | Status: DC | PRN

## 2023-08-22 MED ORDER — WITCH HAZEL-GLYCERIN EX PADS: 1.0000 | MEDICATED_PAD | CUTANEOUS | Status: DC | PRN

## 2023-08-22 MED ORDER — SODIUM CHLORIDE 0.9 % IV SOLN: 250.0000 mL | INTRAVENOUS | Status: DC | PRN

## 2023-08-22 MED ORDER — OXYTOCIN-SODIUM CHLORIDE 30-0.9 UT/500ML-% IV SOLN
INTRAVENOUS | Status: AC
  Filled 2023-08-22: qty 500

## 2023-08-22 MED ORDER — ONDANSETRON HCL 4 MG/2ML IJ SOLN: 4.0000 mg | INTRAMUSCULAR | Status: DC | PRN

## 2023-08-22 NOTE — H&P (Signed)
 OBSTETRIC ADMISSION HISTORY AND PHYSICAL  Denise Tanner is a 23 y.o. female [phone] with IUP at [redacted]w[redacted]d by 9 week [place] presenting for SOL. She reports +FMs, No LOF, no VB, no blurry vision, headaches or peripheral edema, and RUQ pain.  She plans on breast and bottle feeding. She request depo-provera for birth control. She received her prenatal care at  [facility]    Dating: By 9 week [place] --->  Estimated Date of Delivery: 08/26/23  Sono:   @[redacted]w[redacted]d , CWD, normal anatomy, cephalic presentation, posterior placental lie, 1121 gm 2 lb 8 oz 69 % EFW   Prenatal History/Complications:  - Short interval pregnancy - Umbilical cord cyst   Past Medical History: Past Medical History:  Diagnosis Date   Anxiety    BV (bacterial vaginosis)    Gonorrhea    Medical history non-contributory    UTI (urinary tract infection)     Past Surgical History: Past Surgical History:  Procedure Laterality Date   NO PAST SURGERIES     WISDOM TOOTH EXTRACTION      Obstetrical History: OB History     Gravida  4   Para  2   Term  2   Preterm  0   AB  2   Living  2      SAB  2   IAB  0   Ectopic  0   Multiple  0   Live Births  2           Social History Social History   Socioeconomic History   Marital status: Single    Spouse name: Not on file   Number of children: Not on file   Years of education: Not on file   Highest education level: Not on file  Occupational History   Not on file  Tobacco Use   Smoking status: Never    Passive exposure: Never   Smokeless tobacco: Never  Vaping Use   Vaping status: Never Used  Substance and Sexual Activity   Alcohol use: No   Drug use: No   Sexual activity: Not Currently    Partners: Male    Birth control/protection: None  Other Topics Concern   Not on file  Social History Narrative   Lives with mom and brother.    Social Drivers of Corporate investment banker Strain: Not on file  Food Insecurity: No Food Insecurity (04/15/2022)    Hunger Vital Sign    Worried About Running Out of Food in the Last Year: Never true    Ran Out of Food in the Last Year: Never true  Transportation Needs: No Transportation Needs (04/15/2022)   PRAPARE - Administrator, Civil Service (Medical): No    Lack of Transportation (Non-Medical): No  Physical Activity: Not on file  Stress: Not on file  Social Connections: Unknown (04/09/2023)   Received from [facility]   Social Network    Social Network: Not on file    Family History: Family History  Problem Relation Age of Onset   Cancer Mother    Sickle cell anemia Mother    Hypothyroidism Mother    Breast cancer Mother    Healthy Father        was killed   Asthma Brother    Hypertension Maternal Grandmother    Hypothyroidism Maternal Grandmother    Diabetes Paternal Grandmother    Heart disease Neg Hx     Allergies: No Known Allergies  Medications Prior to Admission  Medication Sig Dispense Refill Last Dose/Taking   Blood Pressure Monitoring (BLOOD PRESSURE KIT) DEVI 1 Device by Does not apply route once a week. (Patient not taking: Reported on 08/10/2023) 1 each 0    ondansetron (ZOFRAN-ODT) 4 MG disintegrating tablet Take 1 tablet (4 mg total) by mouth every 8 (eight) hours as needed for nausea or vomiting. (Patient not taking: Reported on 08/10/2023) 20 tablet 0    ondansetron (ZOFRAN-ODT) 4 MG disintegrating tablet Take 1 tablet (4 mg total) by mouth every 6 (six) hours as needed for nausea. (Patient not taking: Reported on 08/10/2023) 20 tablet 1    oseltamivir (TAMIFLU) 75 MG capsule Take 1 capsule (75 mg total) by mouth every 12 (twelve) hours. (Patient not taking: Reported on 08/10/2023) 10 capsule 0    Prenatal Vit-Fe Phos-FA-Omega (VITAFOL GUMMIES) 3.33-0.333-34.8 MG CHEW Chew 3 tablets by mouth daily. 90 tablet 11      Review of Systems   All systems reviewed and negative except as stated in HPI  Blood pressure 122/68, pulse 71, temperature 97.9 F (36.6 C),  temperature source Oral, resp. rate 16, last menstrual period 11/22/2022, SpO2 99%, unknown if currently breastfeeding. General appearance: alert, cooperative, appears stated age, and moderate distress Lungs: clear to auscultation bilaterally Heart: regular rate and rhythm Abdomen: soft, non-tender; bowel sounds normal Pelvic: adequate, proven to 3250g Extremities: Homans sign is negative, no sign of DVT DTR's 2+ Presentation: cephalic Fetal monitoringBaseline: 120 bpm, Variability: Good {> 6 bpm), Accelerations: Reactive, and Decelerations: Absent Uterine activityNone Dilation: 10 Effacement (%): 100 Station: -2 Exam by:: Dr Leanora Cover   Prenatal labs: ABO, Rh: --/--/O POS (03/16 0981) Antibody: NEG (03/16 0612) Rubella: 2.31 (10/02 1055) RPR: Non Reactive (10/02 1055)  HBsAg: Negative (10/02 1055)  HIV: Non Reactive (10/02 1055)  GBS:      Lab Results  Component Value Date   GBS Negative 03/27/2022   GTT not done Genetic screening low risk female, AFP negative Anatomy US normal  There is no immunization history for the selected administration types on file for this patient.  Prenatal Transfer Tool  Maternal Diabetes: No Genetic Screening: Normal Maternal Ultrasounds/Referrals: Normal Fetal Ultrasounds or other Referrals:  None Maternal Substance Abuse:  No Significant Maternal Medications:  None Significant Maternal Lab Results: Other: GBS unknown Number of Prenatal Visits:greater than 3 verified prenatal visits Maternal Vaccinations: Declined Other Comments:  None   Results for orders placed or performed during the hospital encounter of 08/22/23 (from the past 24 hours)  Type and screen [facility]   Collection Time: 08/22/23  6:12 AM  Result Value Ref Range   ABO/RH(D) O POS    Antibody Screen NEG    Sample Expiration      08/25/2023,2359 Performed at [facility], [address], [place], [place] [address]   CBC   Collection Time:  08/22/23  6:13 AM  Result Value Ref Range   WBC 11.5 (H) 4.0 - 10.5 K/uL   RBC 4.64 3.87 - 5.11 MIL/uL   Hemoglobin 10.7 (L) 12.0 - 15.0 g/dL   HCT 82.9 (L) 56.2 - 13.0 %   MCV 73.1 (L) 80.0 - 100.0 fL   MCH 23.1 (L) 26.0 - 34.0 pg   MCHC 31.6 30.0 - 36.0 g/dL   RDW 86.5 (H) 78.4 - 69.6 %   Platelets 302 150 - 400 K/uL   nRBC 0.0 0.0 - 0.2 %    Patient Active Problem List   Diagnosis Date Noted  Indication for care in labor and delivery, antepartum 08/22/2023   Short interval between pregnancies affecting pregnancy, antepartum 04/22/2023   Abnormal umbilical cord 04/22/2023   Supervision of other normal pregnancy, antepartum 03/09/2023   Accessory breast tissue of axilla 03/05/2022   Adjustment disorder with mixed disturbance of emotions and conduct 06/22/2015    Assessment/Plan:  MADLYNN LUNDEEN is a 23 y.o. O1H0865 at [redacted]w[redacted]d here for SOL  #Labor:Expectant management #Pain: Attempt to get epidural #FWB: Cat I #GBS status:  Unknown, term #Feeding: Formula #Reproductive Life planning: Depo Provera #Circ:  yes  Wyn Forster, MD  08/22/2023, 8:41 AM

## 2023-08-22 NOTE — MAU Note (Signed)

## 2023-08-22 NOTE — MAU Note (Signed)
.  ZARAY GATCHEL is a 23 y.o. at [redacted]w[redacted]d here in MAU reporting: patient presents by EMS arrival stating onset of contractions at 0430. Patient states that contractions are 2-3 minutes apart. Denies LOF and VB. Endorses +FM   Onset of complaint: Today 0430 Pain score: 10/10 Vitals:   08/22/23 0553  BP: 117/75  Pulse: 96  Resp: 19  SpO2: 99%     FHT: 140  Lab orders placed from triage: n/a

## 2023-08-22 NOTE — MAU Note (Signed)
.  Denise Tanner is a 23 y.o. at [redacted]w[redacted]d here in MAU reporting:  Arrived by ems ctxs every 2 min, started at 0430am  No lof, no bleeding +FM.   10/10 ctxs  Vitals:   08/22/23 0553  BP: 117/75  Pulse: 96  SpO2: 99%     Lab orders placed from triage: labor eval  FHR 140

## 2023-08-23 LAB — CBC
HCT: 31.5 % — ABNORMAL LOW (ref 36.0–46.0)
Hemoglobin: 10 g/dL — ABNORMAL LOW (ref 12.0–15.0)
MCH: 23.4 pg — ABNORMAL LOW (ref 26.0–34.0)
MCHC: 31.7 g/dL (ref 30.0–36.0)
MCV: 73.6 fL — ABNORMAL LOW (ref 80.0–100.0)
Platelets: 252 10*3/uL (ref 150–400)
RBC: 4.28 MIL/uL (ref 3.87–5.11)
RDW: 16 % — ABNORMAL HIGH (ref 11.5–15.5)
WBC: 13.8 10*3/uL — ABNORMAL HIGH (ref 4.0–10.5)
nRBC: 0 % (ref 0.0–0.2)

## 2023-08-23 MED ORDER — IBUPROFEN 600 MG PO TABS: 600.0000 mg | ORAL_TABLET | Freq: Four times a day (QID) | ORAL | 0 refills | Status: AC

## 2023-08-23 MED ORDER — MEDROXYPROGESTERONE ACETATE 150 MG/ML IM SUSP
150.0000 mg | Freq: Once | INTRAMUSCULAR | Status: AC
  Administered 2023-08-23: 150 mg via INTRAMUSCULAR
  Filled 2023-08-23: qty 1

## 2023-08-23 NOTE — Discharge Summary (Signed)
 Postpartum Discharge Summary    Patient Name: Denise Tanner DOB: Jul 02, 2000 MRN: [id number]  Date of admission: 08/22/2023 Delivery date:08/22/2023 Delivering provider: Wyn Forster Date of discharge: 08/23/2023  Admitting diagnosis: Indication for care in labor and delivery, antepartum [O75.9] Intrauterine pregnancy: [redacted]w[redacted]d     Secondary diagnosis:  Principal Problem:   Indication for care in labor and delivery, antepartum Active Problems:   Vaginal delivery  Additional problems: None    Discharge diagnosis: Term Pregnancy Delivered                                              Post partum procedures: None Augmentation: AROM Complications: None  Hospital course: Onset of Labor With Vaginal Delivery      23 y.o. yo [id number] at [redacted]w[redacted]d was admitted in Active Labor on 08/22/2023. Labor course was complicated by nothing  Membrane Rupture Time/Date:  ,   Delivery Method:Vaginal, Spontaneous Operative Delivery:N/A Episiotomy: None Lacerations:  None Patient had a postpartum course complicated by nothing.  She is ambulating, tolerating a regular diet, passing flatus, and urinating well. Patient is discharged home in stable condition on 08/23/23.  Newborn Data: Birth date:08/22/2023 Birth time:6:49 AM Gender:Female Living status:Living Apgars:8 ,9  Weight:3100 g  Magnesium Sulfate received: No BMZ received: No Rhophylac:N/A MMR:N/A T-DaP: declined Flu: No RSV Vaccine received: No Transfusion:No  Immunizations received: There is no immunization history for the selected administration types on file for this patient.  Physical exam  Vitals:   08/22/23 1839 08/22/23 2237 08/23/23 0516 08/23/23 1259  BP: 133/81 116/73 97/72 109/72  Pulse: 75 68 72 61  Resp: 20 20 18 18   Temp: 98.1 F (36.7 C) 97.9 F (36.6 C) 98 F (36.7 C) 98.4 F (36.9 C)  TempSrc: Oral Oral Oral Oral  SpO2:  99% 100% 100%   General: alert, cooperative, and no distress Lochia:  appropriate Uterine Fundus: firm Incision: N/A DVT Evaluation: No evidence of DVT seen on physical exam. Labs: Lab Results  Component Value Date   WBC 13.8 (H) 08/23/2023   HGB 10.0 (L) 08/23/2023   HCT 31.5 (L) 08/23/2023   MCV 73.6 (L) 08/23/2023   PLT 252 08/23/2023      Latest Ref Rng & Units 07/14/2023    4:31 AM  CMP  Glucose 70 - 99 mg/dL 85   BUN 6 - 20 mg/dL <5   Creatinine 6.96 - 1.00 mg/dL 2.95   Sodium 284 - 132 mmol/L 135   Potassium 3.5 - 5.1 mmol/L 3.9   Chloride 98 - 111 mmol/L 106   CO2 22 - 32 mmol/L 21   Calcium 8.9 - 10.3 mg/dL 8.2   Total Protein 6.5 - 8.1 g/dL 6.0   Total Bilirubin 0.0 - 1.2 mg/dL 0.6   Alkaline Phos 38 - 126 U/L 89   AST 15 - 41 U/L 16   ALT 0 - 44 U/L 8    Edinburgh Score:    08/22/2023    8:50 AM  Edinburgh Postnatal Depression Scale Screening Tool  I have been able to laugh and see the funny side of things. 0  I have looked forward with enjoyment to things. 2  I have blamed myself unnecessarily when things went wrong. 0  I have been anxious or worried for no good reason. 0  I have felt scared or panicky for no good  reason. 0  Things have been getting on top of me. 1  I have been so unhappy that I have had difficulty sleeping. 0  I have felt sad or miserable. 1  I have been so unhappy that I have been crying. 1  The thought of harming myself has occurred to me. 0  Edinburgh Postnatal Depression Scale Total 5   Edinburgh Postnatal Depression Scale Total: 5   After visit meds:  Allergies as of 08/23/2023   No Known Allergies      Medication List     STOP taking these medications    Blood Pressure Kit Devi   ondansetron 4 MG disintegrating tablet Commonly known as: ZOFRAN-ODT   oseltamivir 75 MG capsule Commonly known as: TAMIFLU       TAKE these medications    ibuprofen 600 MG tablet Commonly known as: ADVIL Take 1 tablet (600 mg total) by mouth every 6 (six) hours.   Vitafol Gummies 3.33-0.333-34.8 MG  Chew Chew 3 tablets by mouth daily.         Discharge home in stable condition Infant Feeding: Bottle Infant Disposition:rooming in Discharge instruction: per After Visit Summary and Postpartum booklet. Activity: Advance as tolerated. Pelvic rest for 6 weeks.  Diet: iron rich diet Future Appointments: Future Appointments  Date Time Provider Department Center  08/24/2023  2:50 PM Leftwich-Kirby, Wilmer Floor, CNM [facility] None   Follow up Visit:  Follow-up Information     [facility] Follow up.   Specialty: Obstetrics and Gynecology Why: For postpartum visit Contact information: [address] [place] [address] [phone]        [facility] Follow up.   Specialty: Obstetrics and Gynecology Why: As needed in emergencies Contact information: [address] [place] [address] [phone]                 Please schedule this patient for a In person postpartum visit in 4 weeks with the following provider: Any provider. Additional Postpartum F/U: none   Low risk pregnancy complicated by:  nothing Delivery mode:  Vaginal, Spontaneous Anticipated Birth Control:  Depo   08/23/2023 Dorathy Kinsman, CNM

## 2023-08-23 NOTE — Progress Notes (Signed)
 MOB was referred for history of anxiety.  * Referral screened out by Clinical Social Worker because none of the following criteria appear to apply:  ~ History of anxiety during this pregnancy, or of post-partum depression following prior delivery.  ~ Diagnosis of anxiety within last 3 years   OR  * MOB's symptoms currently being treated with medication and/or therapy.  Per OB notes, MOB is participating in therapy with [facility].  Please contact the Clinical Social Worker if needs arise, by [facility] request, or if MOB scores greater than 9/yes to question 10 on Edinburgh Postpartum Depression Screen.  Enos Fling, Theresia Majors Clinical Social Worker [phone]

## 2023-08-24 ENCOUNTER — Encounter: Admitting: Advanced Practice Midwife

## 2023-08-25 ENCOUNTER — Ambulatory Visit (HOSPITAL_COMMUNITY): Payer: Self-pay

## 2023-08-25 NOTE — Lactation Note (Signed)
 This note was copied from a baby's chart. Lactation Consultation Note  Patient Name: Denise Tanner Date: 08/25/2023 Age:24 hours  Reason for consult: Initial assessment;Term;Engorgement  P2, [redacted]w[redacted]d, 0.1% weight loss  Mother's feeding choice is to formula feed her baby. He is tolerating well. Mother's milk is in and her breast are full and becoming engorged. She has edema noted in her right axillary as milk volume has increased. Advised to ice and apply cabbage leaves to that area to promote absorption.   Mother was receptive to [facility] visit and management of engorgement. She has a 24 year old and experienced engorgement after his delivery but her milk did not come in this quick. She states she does not wanted to put baby to breast, however, she was receptive to latch today with my help for milk removal. Baby had just had formula. He latched with ease and basic breastfeeding instructions discussed. Mother said that latching isn't painful and she may continue once home. Breast softened some with his brief latch.   Manual breast pump given and instructed mother on the use, frequency, cleaning of breast pump and storage of breast milk.  CDC guidelines for milk storage, preparation and cleaning of breast pump parts provided. Advised how to manage non-nursing engorgement, mother is to apply ice packs, continue taking her ibuprofen, remove milk by hand and breast pump to comfort every 2-3 hours. Mother has used cold cabbage leaves with her last experience with engorgement and found it to gave her relief. Discussed sign and symptoms of mastitis and to call her OB provided if any should, especially fever.   Mother is in a hurry to be discharged and her boyfriend and 81 year old are in the car waiting fore her and baby. Mom made aware of O/P services, breastfeeding support groups, community resources, and our phone # for post-discharge questions.      Maternal Data Has patient been taught Hand  Expression?: Yes Does the patient have breastfeeding experience prior to this delivery?: No  Feeding Mother's Current Feeding Choice: Formula Nipple Type: Slow - flow  LATCH Score Latch: Grasps breast easily, tongue down, lips flanged, rhythmical sucking.  Audible Swallowing: Spontaneous and intermittent  Type of Nipple: Everted at rest and after stimulation  Comfort (Breast/Nipple): Filling, red/small blisters or bruises, mild/mod discomfort  Hold (Positioning): Assistance needed to correctly position infant at breast and maintain latch.  LATCH Score: 8   Lactation Tools Discussed/Used Tools: Pump;Flanges Flange Size: 18;21 Breast pump type: Manual Pump Education: Setup, frequency, and cleaning;Milk Storage Reason for Pumping: engorgement management, mother considering pumping and giving EBM by bottle Pumping frequency: every 2-3 hrs to soften breast to comfort Pumped volume: 15 mL (collected within 2-3 min)  Interventions Interventions: Breast feeding basics reviewed;Assisted with latch;Hand express;Pre-pump if needed;Breast compression;Position options;Hand pump;Education;Ice;LC Services brochure;CDC milk storage guidelines;CDC Guidelines for Breast Pump Cleaning  Discharge Discharge Education: Engorgement and breast care;Warning signs for feeding baby;Other (comment);Outpatient recommendation (Mother has [facility]) Pump: Manual  Consult Status Consult Status: Complete    Omar Person 08/25/2023, 11:36 AM

## 2023-09-01 ENCOUNTER — Telehealth (HOSPITAL_COMMUNITY): Payer: Self-pay | Admitting: *Deleted

## 2023-09-01 DIAGNOSIS — Z1331 Encounter for screening for depression: Secondary | ICD-10-CM

## 2023-09-01 NOTE — Telephone Encounter (Signed)
 09/01/2023  Name: Denise Tanner MRN: [id number] DOB: 2000-07-25  Reason for Call:  Transition of Care Hospital Discharge Call  Contact Status: Patient Contact Status: Complete  Language assistant needed:          Follow-Up Questions: Do You Have Any Concerns About Your Health As You Heal From Delivery?: No Do You Have Any Concerns About Your Infants Health?: Yes What Concerns Do You Have About Your Baby?: Patient reported infant was admitted to hospital "2 days ago with vomiting and diarrhea."  Edinburgh Postnatal Depression Scale:  In the Past 7 Days: I have been able to laugh and see the funny side of things.: Not quite so much now I have looked forward with enjoyment to things.: As much as I ever did I have blamed myself unnecessarily when things went wrong.: Yes, some of the time I have been anxious or worried for no good reason.: Yes, sometimes I have felt scared or panicky for no good reason.: No, not at all Things have been getting on top of me.: Yes, sometimes I haven't been coping as well as usual I have been so unhappy that I have had difficulty sleeping.: Not at all I have felt sad or miserable.: Yes, quite often I have been so unhappy that I have been crying.: Only occasionally The thought of harming myself has occurred to me.: Never Inocente Salles Postnatal Depression Scale Total: (!) 10 Patient agreed for RN to make referral to Integrated Behavioral Health. Referral made and Dr. Jolayne Panther notified via chart. Patient also agreed for RN to email list of maternal mental health resources. PHQ2-9 Depression Scale:     Discharge Follow-up: Edinburgh score requires follow up?: Yes Provider notified of Edinburgh score?: Yes Have you already been referred for a counseling appointment?: No Patient was advised of the following resources:: Breastfeeding Support Group, Support Group  Post-discharge interventions: Maternal Mental Health Resources provided Placed IBH referral in  patient's chart  Signature Deforest Hoyles, RN, 09/01/23, [phone]

## 2023-09-20 ENCOUNTER — Telehealth: Payer: Self-pay | Admitting: Clinical

## 2023-09-20 NOTE — Telephone Encounter (Signed)
Attempt call regarding referral; Unable to leave message as mailbox has not been set up.

## 2023-09-27 ENCOUNTER — Ambulatory Visit: Admitting: Obstetrics

## 2023-11-17 ENCOUNTER — Ambulatory Visit: Admitting: Obstetrics and Gynecology

## 2023-11-26 ENCOUNTER — Ambulatory Visit: Admitting: Obstetrics and Gynecology

## 2023-12-23 ENCOUNTER — Ambulatory Visit

## 2024-01-05 ENCOUNTER — Ambulatory Visit: Admitting: Physician Assistant

## 2024-01-19 ENCOUNTER — Other Ambulatory Visit (HOSPITAL_COMMUNITY)
Admission: RE | Admit: 2024-01-19 | Discharge: 2024-01-19 | Disposition: A | Discharge status: Home / Self Care | Source: Ambulatory Visit | Attending: Physician Assistant | Admitting: Physician Assistant

## 2024-01-19 ENCOUNTER — Encounter: Payer: Self-pay | Admitting: Physician Assistant

## 2024-01-19 ENCOUNTER — Ambulatory Visit: Admitting: Physician Assistant

## 2024-01-19 ENCOUNTER — Encounter: Payer: Self-pay | Admitting: Emergency Medicine

## 2024-01-19 VITALS — BP 113/65 | HR 74 | Ht 65.0 in | Wt 164.5 lb

## 2024-01-19 DIAGNOSIS — Z30013 Encounter for initial prescription of injectable contraceptive: Secondary | ICD-10-CM

## 2024-01-19 DIAGNOSIS — Z3202 Encounter for pregnancy test, result negative: Secondary | ICD-10-CM | POA: Diagnosis not present

## 2024-01-19 DIAGNOSIS — R3989 Other symptoms and signs involving the genitourinary system: Secondary | ICD-10-CM

## 2024-01-19 LAB — POCT URINE PREGNANCY: Preg Test, Ur: NEGATIVE

## 2024-01-19 MED ORDER — MEDROXYPROGESTERONE ACETATE 150 MG/ML IM SUSP
150.0000 mg | Freq: Once | INTRAMUSCULAR | Status: AC
  Administered 2024-01-19 (×2): 150 mg via INTRAMUSCULAR

## 2024-01-19 NOTE — Progress Notes (Signed)
 Pt presents for bc. Pt wants depo. Pt states that she had unprotected sex two days ago.

## 2024-01-19 NOTE — Progress Notes (Signed)
 GYNECOLOGY  VISIT   HPI: Denise Tanner is a 23 y.o.  single female [phone] here for DMPA with last unprotected intercourse two days ago. Previously on DMPA, re-starting today after missed injection. UPT is negative.  Also concern for vaginal malodor without abnormal discharge, vaginal itching, burning, dysuria, urinary frequency/urgency, abdominal pain.   GYNECOLOGIC HISTORY: Patient's last menstrual period was 01/06/2024 (approximate). Contraception: none.   Menopausal hormone therapy:  premenopausal Last mammogram:  Never previously done due to age Last pap smear:  Diagnosis  Date Value Ref Range Status  07/23/2021   Final   - Negative for intraepithelial lesion or malignancy (NILM)           OB History     Gravida  4   Para  2   Term  2   Preterm  0   AB  2   Living  2      SAB  2   IAB  0   Ectopic  0   Multiple  0   Live Births  2              Patient Active Problem List   Diagnosis Date Noted   Vaginal delivery 08/23/2023   Indication for care in labor and delivery, antepartum 08/22/2023   Short interval between pregnancies affecting pregnancy, antepartum 04/22/2023   Abnormal umbilical cord 04/22/2023   Supervision of other normal pregnancy, antepartum 03/09/2023   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 (Patient not taking: Reported on 01/19/2024) 30 tablet 0   Prenatal Vit-Fe Phos-FA-Omega (VITAFOL  GUMMIES) 3.33-0.333-34.8 MG CHEW  Chew 3 tablets by mouth daily. (Patient not taking: Reported on 01/19/2024) 90 tablet 11   No current facility-administered medications for this visit.     ALLERGIES: Patient has no known allergies.  Family History  Problem Relation Age of Onset   Cancer Mother    Sickle cell anemia Mother    Hypothyroidism Mother    Breast cancer Mother    Healthy Father        was killed   Asthma Brother    Hypertension Maternal Grandmother    Hypothyroidism Maternal Grandmother    Diabetes Paternal Grandmother    Heart disease Neg Hx     Social History   Socioeconomic History   Marital status: Single    Spouse name: Not on file   Number of children: Not on file   Years of education: Not on file   Highest education level: Not on file  Occupational History   Not on file  Tobacco Use   Smoking status: Never    Passive exposure: Never   Smokeless tobacco: Never  Vaping Use   Vaping status: Never Used  Substance and Sexual Activity   Alcohol use: No   Drug use: No   Sexual activity: Not Currently    Partners: Male    Birth control/protection: None  Other Topics Concern   Not on file  Social History Narrative   Lives with mom and brother.    Social Drivers of Corporate investment banker Strain: Not on file  Food Insecurity: No Food Insecurity (08/22/2023)   Hunger Vital Sign    Worried About Running Out of Food in the Last Year: Never true    Ran Out of Food in the Last Year: Never true  Transportation Needs: No Transportation Needs (08/22/2023)   PRAPARE - Administrator, Civil Service (Medical): No    Lack of Transportation (Non-Medical): No  Physical Activity: Not on file  Stress: Not on file  Social Connections: Unknown (04/09/2023)   Received from [facility]   Social Network    Social Network: Not on file  Intimate Partner Violence: Not At Risk (08/22/2023)   Humiliation, Afraid, Rape, and Kick questionnaire    Fear of Current or Ex-Partner: No     Emotionally Abused: No    Physically Abused: No    Sexually Abused: No    Review of Systems  PHYSICAL EXAMINATION:    BP 113/65   Pulse 74   Ht 5' 5 (1.651 m)   Wt 164 lb 8 oz (74.6 kg)   LMP 01/06/2024 (Approximate)   Breastfeeding No   BMI 27.37 kg/m     General appearance: alert, cooperative and appears stated age Head: Normocephalic, without obvious abnormality, atraumatic Neck: Symmetrical, trachea midline and thyroid normal to inspection  Lungs: clear to auscultation bilaterally Heart: regular rate  Extremities: extremities normal, atraumatic, no cyanosis or edema Neurologic: Grossly normal  ASSESSMENT & PLAN   Encounter for initial prescription of injectable contraceptive 23 year old female patient presenting to re-initiate depo provera  injections. We had a detailed discussion about the fact that I cannot be reasonably certain that she is not pregnant d/t unprotected intercourse two day ago, even with negative UPT today. I gave the patient the option to RTC in two weeks for DMPA with no unprotected intercourse in meantime. She elected for DMPA today and voiced understanding of associated risk. Patient has no known history of any contraindications to DMPA by her report or by review of EMR. She is aware of need for back-up method for next week to prevent unintended pregnancy.  - POCT urine pregnancy - medroxyPROGESTERone  (DEPO-PROVERA ) injection 150 mg   2. Abnormal discharge - Cervicovaginal ancillary only   An After Visit Summary was printed and given to the patient.  Branna Cortina E Natallie Ravenscroft, PA-C 8/13/20253:51 PM

## 2024-01-20 LAB — CERVICOVAGINAL ANCILLARY ONLY
Chlamydia: NEGATIVE
Comment: NEGATIVE
Comment: NEGATIVE
Comment: NORMAL
Neisseria Gonorrhea: NEGATIVE
Trichomonas: NEGATIVE

## 2024-01-25 ENCOUNTER — Ambulatory Visit: Payer: Self-pay | Admitting: Physician Assistant

## 2024-01-31 ENCOUNTER — Other Ambulatory Visit: Payer: Self-pay

## 2024-01-31 MED ORDER — FLUCONAZOLE 150 MG PO TABS: 150.0000 mg | ORAL_TABLET | Freq: Once | ORAL | 0 refills | Status: AC

## 2024-04-20 ENCOUNTER — Ambulatory Visit (INDEPENDENT_AMBULATORY_CARE_PROVIDER_SITE_OTHER)

## 2024-04-20 VITALS — BP 109/71 | HR 80 | Wt 159.0 lb

## 2024-04-20 DIAGNOSIS — Z3042 Encounter for surveillance of injectable contraceptive: Secondary | ICD-10-CM

## 2024-04-20 DIAGNOSIS — Z3202 Encounter for pregnancy test, result negative: Secondary | ICD-10-CM | POA: Diagnosis not present

## 2024-04-20 LAB — POCT URINE PREGNANCY: Preg Test, Ur: NEGATIVE

## 2024-04-20 MED ORDER — MEDROXYPROGESTERONE ACETATE 150 MG/ML IM SUSP
150.0000 mg | INTRAMUSCULAR | Status: AC
  Administered 2024-04-20: 150 mg via INTRAMUSCULAR

## 2024-04-20 MED ORDER — MEDROXYPROGESTERONE ACETATE 150 MG/ML IM SUSP: 150.0000 mg | INTRAMUSCULAR | 2 refills | Status: AC

## 2024-04-20 NOTE — Addendum Note (Signed)
 Addended by: DONETA GRATE D on: 04/20/2024 02:21 PM   Modules accepted: Orders

## 2024-04-20 NOTE — Progress Notes (Signed)
 Pt is in the office for depo injection, 1 day overdue UPT is negative today, reports last SI 1 week ago Administered depo in RUOQ and pt tolerated well Next due Jan 29- Feb 12 .SABRA Administrations This Visit     medroxyPROGESTERone  (DEPO-PROVERA ) injection 150 mg     Admin Date 04/20/2024 Action Given Dose 150 mg Route Intramuscular Documented By Doneta Laymon BIRCH, RN

## 2024-07-06 ENCOUNTER — Ambulatory Visit: Admitting: Obstetrics

## 2024-07-20 ENCOUNTER — Ambulatory Visit: Payer: Self-pay | Admitting: Physician Assistant
